# Patient Record
Sex: Female | Born: 1975 | Race: White | Hispanic: No | Marital: Married | Smoking: Current every day smoker
Health system: Southern US, Community
[De-identification: ages and names within clinical notes are randomized; demographics above are authoritative.]

## PROBLEM LIST (undated history)

## (undated) DIAGNOSIS — F1911 Other psychoactive substance abuse, in remission: Secondary | ICD-10-CM

## (undated) DIAGNOSIS — R030 Elevated blood-pressure reading, without diagnosis of hypertension: Secondary | ICD-10-CM

## (undated) DIAGNOSIS — F1910 Other psychoactive substance abuse, uncomplicated: Secondary | ICD-10-CM

## (undated) DIAGNOSIS — J45909 Unspecified asthma, uncomplicated: Secondary | ICD-10-CM

## (undated) DIAGNOSIS — F1010 Alcohol abuse, uncomplicated: Secondary | ICD-10-CM

## (undated) DIAGNOSIS — R51 Headache: Secondary | ICD-10-CM

## (undated) DIAGNOSIS — F1011 Alcohol abuse, in remission: Secondary | ICD-10-CM

## (undated) DIAGNOSIS — E785 Hyperlipidemia, unspecified: Secondary | ICD-10-CM

## (undated) DIAGNOSIS — K589 Irritable bowel syndrome without diarrhea: Secondary | ICD-10-CM

## (undated) DIAGNOSIS — N200 Calculus of kidney: Secondary | ICD-10-CM

## (undated) DIAGNOSIS — E669 Obesity, unspecified: Secondary | ICD-10-CM

## (undated) DIAGNOSIS — B009 Herpesviral infection, unspecified: Secondary | ICD-10-CM

## (undated) DIAGNOSIS — R7303 Prediabetes: Secondary | ICD-10-CM

## (undated) DIAGNOSIS — G43909 Migraine, unspecified, not intractable, without status migrainosus: Secondary | ICD-10-CM

## (undated) DIAGNOSIS — F419 Anxiety disorder, unspecified: Secondary | ICD-10-CM

## (undated) DIAGNOSIS — F32A Depression, unspecified: Secondary | ICD-10-CM

## (undated) HISTORY — DX: Herpesviral infection, unspecified: B00.9

## (undated) HISTORY — DX: Unspecified asthma, uncomplicated: J45.909

## (undated) HISTORY — DX: Other psychoactive substance abuse, uncomplicated: F19.10

## (undated) HISTORY — DX: Obesity, unspecified: E66.9

## (undated) HISTORY — DX: Alcohol abuse, in remission: F10.11

## (undated) HISTORY — DX: Prediabetes: R73.03

## (undated) HISTORY — DX: Elevated blood-pressure reading, without diagnosis of hypertension: R03.0

## (undated) HISTORY — DX: Headache: R51

## (undated) HISTORY — DX: Anxiety disorder, unspecified: F41.9

## (undated) HISTORY — PX: TYMPANOSTOMY TUBE PLACEMENT: SHX32

## (undated) HISTORY — DX: Alcohol abuse, uncomplicated: F10.10

## (undated) HISTORY — DX: Other psychoactive substance abuse, in remission: F19.11

## (undated) HISTORY — DX: Depression, unspecified: F32.A

## (undated) HISTORY — DX: Irritable bowel syndrome, unspecified: K58.9

## (undated) HISTORY — DX: Hyperlipidemia, unspecified: E78.5

## (undated) HISTORY — DX: Migraine, unspecified, not intractable, without status migrainosus: G43.909

## (undated) HISTORY — DX: Calculus of kidney: N20.0

---

## 1999-08-19 ENCOUNTER — Encounter: Payer: Self-pay | Admitting: Urology

## 1999-08-19 ENCOUNTER — Encounter: Admission: RE | Admit: 1999-08-19 | Discharge: 1999-08-19 | Payer: Self-pay | Admitting: Urology

## 2000-08-03 ENCOUNTER — Emergency Department (HOSPITAL_COMMUNITY): Admission: EM | Admit: 2000-08-03 | Discharge: 2000-08-03 | Payer: Self-pay | Admitting: Emergency Medicine

## 2000-08-05 ENCOUNTER — Emergency Department (HOSPITAL_COMMUNITY): Admission: EM | Admit: 2000-08-05 | Discharge: 2000-08-05 | Payer: Self-pay | Admitting: Emergency Medicine

## 2000-09-22 ENCOUNTER — Emergency Department (HOSPITAL_COMMUNITY): Admission: EM | Admit: 2000-09-22 | Discharge: 2000-09-22 | Payer: Self-pay | Admitting: Emergency Medicine

## 2000-12-20 ENCOUNTER — Emergency Department (HOSPITAL_COMMUNITY): Admission: EM | Admit: 2000-12-20 | Discharge: 2000-12-21 | Payer: Self-pay | Admitting: Emergency Medicine

## 2000-12-20 ENCOUNTER — Encounter: Payer: Self-pay | Admitting: Emergency Medicine

## 2004-03-23 HISTORY — PX: OTHER SURGICAL HISTORY: SHX169

## 2006-06-10 ENCOUNTER — Emergency Department (HOSPITAL_COMMUNITY): Admission: EM | Admit: 2006-06-10 | Discharge: 2006-06-10 | Payer: Self-pay | Admitting: Emergency Medicine

## 2007-01-15 ENCOUNTER — Encounter: Payer: Self-pay | Admitting: Family Medicine

## 2007-02-17 ENCOUNTER — Emergency Department (HOSPITAL_COMMUNITY): Admission: EM | Admit: 2007-02-17 | Discharge: 2007-02-18 | Payer: Self-pay | Admitting: Emergency Medicine

## 2007-03-24 ENCOUNTER — Emergency Department (HOSPITAL_COMMUNITY): Admission: EM | Admit: 2007-03-24 | Discharge: 2007-03-24 | Payer: Self-pay | Admitting: Emergency Medicine

## 2007-12-05 ENCOUNTER — Encounter: Payer: Self-pay | Admitting: Family Medicine

## 2007-12-05 ENCOUNTER — Encounter: Admission: RE | Admit: 2007-12-05 | Discharge: 2007-12-05 | Payer: Self-pay | Admitting: Internal Medicine

## 2007-12-16 ENCOUNTER — Encounter: Payer: Self-pay | Admitting: Family Medicine

## 2007-12-16 ENCOUNTER — Encounter: Admission: RE | Admit: 2007-12-16 | Discharge: 2007-12-16 | Payer: Self-pay | Admitting: Internal Medicine

## 2007-12-30 ENCOUNTER — Encounter: Admission: RE | Admit: 2007-12-30 | Discharge: 2007-12-30 | Payer: Self-pay | Admitting: Internal Medicine

## 2007-12-30 ENCOUNTER — Encounter: Payer: Self-pay | Admitting: Family Medicine

## 2008-10-30 ENCOUNTER — Ambulatory Visit: Payer: Self-pay | Admitting: Family Medicine

## 2008-10-30 DIAGNOSIS — A6000 Herpesviral infection of urogenital system, unspecified: Secondary | ICD-10-CM | POA: Insufficient documentation

## 2008-10-30 DIAGNOSIS — F1021 Alcohol dependence, in remission: Secondary | ICD-10-CM | POA: Insufficient documentation

## 2008-11-01 DIAGNOSIS — J309 Allergic rhinitis, unspecified: Secondary | ICD-10-CM | POA: Insufficient documentation

## 2008-11-01 DIAGNOSIS — Z87442 Personal history of urinary calculi: Secondary | ICD-10-CM

## 2008-11-09 ENCOUNTER — Encounter: Admission: RE | Admit: 2008-11-09 | Discharge: 2008-11-09 | Payer: Self-pay | Admitting: Family Medicine

## 2008-11-09 ENCOUNTER — Ambulatory Visit: Payer: Self-pay | Admitting: Family Medicine

## 2008-11-18 ENCOUNTER — Ambulatory Visit: Payer: Self-pay | Admitting: Family Medicine

## 2008-12-08 ENCOUNTER — Telehealth: Payer: Self-pay | Admitting: Family Medicine

## 2009-02-04 ENCOUNTER — Ambulatory Visit (HOSPITAL_COMMUNITY): Admission: RE | Admit: 2009-02-04 | Discharge: 2009-02-04 | Payer: Self-pay | Admitting: Urology

## 2009-02-10 ENCOUNTER — Telehealth: Payer: Self-pay | Admitting: Family Medicine

## 2009-03-09 ENCOUNTER — Ambulatory Visit: Payer: Self-pay | Admitting: Family Medicine

## 2009-03-09 LAB — CONVERTED CEMR LAB: Rapid Strep: NEGATIVE

## 2009-03-26 ENCOUNTER — Ambulatory Visit: Payer: Self-pay | Admitting: Family Medicine

## 2009-06-15 ENCOUNTER — Telehealth: Payer: Self-pay | Admitting: Family Medicine

## 2009-06-23 ENCOUNTER — Ambulatory Visit: Payer: Self-pay | Admitting: Family Medicine

## 2009-10-21 ENCOUNTER — Ambulatory Visit: Payer: Self-pay | Admitting: Family Medicine

## 2009-10-21 LAB — CONVERTED CEMR LAB: Rapid Strep: NEGATIVE

## 2009-11-08 ENCOUNTER — Ambulatory Visit: Payer: Self-pay | Admitting: Family Medicine

## 2009-11-08 DIAGNOSIS — E785 Hyperlipidemia, unspecified: Secondary | ICD-10-CM | POA: Insufficient documentation

## 2009-11-08 DIAGNOSIS — F988 Other specified behavioral and emotional disorders with onset usually occurring in childhood and adolescence: Secondary | ICD-10-CM | POA: Insufficient documentation

## 2009-11-08 DIAGNOSIS — R5383 Other fatigue: Secondary | ICD-10-CM

## 2009-11-08 DIAGNOSIS — R5381 Other malaise: Secondary | ICD-10-CM

## 2009-11-09 LAB — CONVERTED CEMR LAB: Vit D, 25-Hydroxy: 34 ng/mL

## 2009-11-10 ENCOUNTER — Telehealth: Payer: Self-pay | Admitting: Family Medicine

## 2009-11-12 LAB — CONVERTED CEMR LAB
Albumin: 3.9 g/dL (ref 3.5–5.2)
Alkaline Phosphatase: 43 units/L (ref 39–117)
Basophils Absolute: 0.1 10*3/uL (ref 0.0–0.1)
Basophils Relative: 0.4 % (ref 0.0–3.0)
CO2: 22 meq/L (ref 19–32)
Calcium: 9.4 mg/dL (ref 8.4–10.5)
Chloride: 107 meq/L (ref 96–112)
Eosinophils Absolute: 0 10*3/uL (ref 0.0–0.7)
Glucose, Bld: 86 mg/dL (ref 70–99)
HCT: 43.6 % (ref 36.0–46.0)
HDL: 48.7 mg/dL (ref >39.00)
Hemoglobin: 14.3 g/dL (ref 12.0–15.0)
Lymphs Abs: 3.2 10*3/uL (ref 0.7–4.0)
MCHC: 32.8 g/dL (ref 30.0–36.0)
MCV: 94.2 fL (ref 78.0–100.0)
Monocytes Absolute: 1 10*3/uL (ref 0.1–1.0)
Neutro Abs: 12.3 10*3/uL — ABNORMAL HIGH (ref 1.4–7.7)
RBC: 4.63 M/uL (ref 3.87–5.11)
RDW: 12.4 % (ref 11.5–14.6)
Sodium: 142 meq/L (ref 135–145)
TSH: 1.08 microintl units/mL (ref 0.35–5.50)
Total CHOL/HDL Ratio: 4
Total Protein: 7.2 g/dL (ref 6.0–8.3)

## 2009-11-16 ENCOUNTER — Encounter: Payer: Self-pay | Admitting: Family Medicine

## 2009-12-13 ENCOUNTER — Ambulatory Visit: Payer: Self-pay | Admitting: Family Medicine

## 2009-12-13 DIAGNOSIS — K589 Irritable bowel syndrome without diarrhea: Secondary | ICD-10-CM

## 2010-01-14 ENCOUNTER — Telehealth: Payer: Self-pay | Admitting: Family Medicine

## 2010-01-17 ENCOUNTER — Encounter: Payer: Self-pay | Admitting: Family Medicine

## 2010-01-31 ENCOUNTER — Ambulatory Visit: Payer: Self-pay | Admitting: Family Medicine

## 2010-01-31 LAB — CONVERTED CEMR LAB
Bilirubin Urine: NEGATIVE
Glucose, Urine, Semiquant: NEGATIVE
Ketones, urine, test strip: NEGATIVE
Specific Gravity, Urine: 1.02
Urobilinogen, UA: 0.2

## 2010-03-10 ENCOUNTER — Ambulatory Visit: Payer: Self-pay | Admitting: Family Medicine

## 2010-03-10 LAB — CONVERTED CEMR LAB
Glucose, Urine, Semiquant: NEGATIVE
Protein, U semiquant: NEGATIVE
pH: 7

## 2010-04-08 ENCOUNTER — Telehealth: Payer: Self-pay | Admitting: Family Medicine

## 2010-04-10 ENCOUNTER — Encounter: Payer: Self-pay | Admitting: Family Medicine

## 2010-04-19 ENCOUNTER — Telehealth: Payer: Self-pay | Admitting: Family Medicine

## 2010-04-22 ENCOUNTER — Ambulatory Visit: Payer: Self-pay | Admitting: Family Medicine

## 2010-06-23 ENCOUNTER — Ambulatory Visit: Payer: Self-pay | Admitting: Family Medicine

## 2010-06-23 ENCOUNTER — Telehealth: Payer: Self-pay | Admitting: Family Medicine

## 2010-06-23 DIAGNOSIS — J45909 Unspecified asthma, uncomplicated: Secondary | ICD-10-CM | POA: Insufficient documentation

## 2010-06-23 DIAGNOSIS — N6459 Other signs and symptoms in breast: Secondary | ICD-10-CM

## 2010-06-23 LAB — CONVERTED CEMR LAB
Nitrite: NEGATIVE
Protein, U semiquant: NEGATIVE
Specific Gravity, Urine: >=1.03
WBC Urine, dipstick: NEGATIVE

## 2010-06-24 LAB — CONVERTED CEMR LAB: Prolactin: 10 ng/mL

## 2010-06-29 ENCOUNTER — Encounter: Admission: RE | Admit: 2010-06-29 | Discharge: 2010-06-29 | Payer: Self-pay | Admitting: Family Medicine

## 2010-06-30 ENCOUNTER — Telehealth: Payer: Self-pay | Admitting: Family Medicine

## 2010-06-30 ENCOUNTER — Encounter: Payer: Self-pay | Admitting: Family Medicine

## 2010-07-05 ENCOUNTER — Ambulatory Visit: Payer: Self-pay | Admitting: Internal Medicine

## 2010-07-05 LAB — CONVERTED CEMR LAB
Bilirubin Urine: NEGATIVE
Glucose, Urine, Semiquant: NEGATIVE
Specific Gravity, Urine: 1.025
pH: 5

## 2010-07-06 ENCOUNTER — Encounter: Payer: Self-pay | Admitting: Family Medicine

## 2010-07-07 ENCOUNTER — Telehealth: Payer: Self-pay | Admitting: Family Medicine

## 2010-09-24 ENCOUNTER — Ambulatory Visit: Payer: Self-pay | Admitting: Internal Medicine

## 2010-09-30 ENCOUNTER — Encounter: Payer: Self-pay | Admitting: Family Medicine

## 2010-09-30 ENCOUNTER — Ambulatory Visit: Payer: Self-pay | Admitting: Internal Medicine

## 2010-10-31 ENCOUNTER — Telehealth (INDEPENDENT_AMBULATORY_CARE_PROVIDER_SITE_OTHER): Payer: Self-pay | Admitting: *Deleted

## 2010-11-02 ENCOUNTER — Ambulatory Visit
Admission: RE | Admit: 2010-11-02 | Discharge: 2010-11-02 | Payer: Self-pay | Source: Home / Self Care | Attending: Family Medicine | Admitting: Family Medicine

## 2010-11-02 DIAGNOSIS — M5430 Sciatica, unspecified side: Secondary | ICD-10-CM | POA: Insufficient documentation

## 2010-11-03 ENCOUNTER — Encounter: Payer: Self-pay | Admitting: Family Medicine

## 2010-11-03 ENCOUNTER — Telehealth: Payer: Self-pay | Admitting: Family Medicine

## 2010-11-11 ENCOUNTER — Telehealth (INDEPENDENT_AMBULATORY_CARE_PROVIDER_SITE_OTHER): Payer: Self-pay | Admitting: *Deleted

## 2010-11-13 ENCOUNTER — Encounter: Payer: Self-pay | Admitting: Internal Medicine

## 2010-11-22 NOTE — Progress Notes (Signed)
Summary: Hand swollen  Phone Note Call from Patient Call back at Home Phone [phone]   Caller: Patient Call For: [name] MD Summary of Call: Patient was in earlier today to see you and had lab work done. Patient called to let you know that the area were the blood was drawn from her hand is swollen about the size of a quarter, hot and painful. Patient states that she has never had this happen before after having blood work. Please advise. Initial call taken by: Sydell Axon LPN,  June 23, 2010 1:48 PM  Follow-up for Phone Call        could have some thrombophelbitis -- apply ice several times a day, tylenol could also have blown a vein -- will know if she gets diffuse bruising later today or tomorrow  if dramatically worse, can always be rechecked tomorrow Follow-up by: [name] MD,  June 23, 2010 2:00 PM  Additional Follow-up for Phone Call Additional follow up Details #1::        patient advised Additional Follow-up by: Benny Lennert CMA (AAMA),  June 23, 2010 2:02 PM

## 2010-11-22 NOTE — Letter (Signed)
Summary: Out of School  [facility]  [address] [place], [place] [address]   Phone: [phone]  Fax: [phone]    September 30, 2010   Student:  Candice Mcgrath    To Whom It May Concern:   For Medical reasons, please excuse the above named student from school for the following dates:  Start:   September 30, 2010  End:    September 30, 2010   Also, patient was seen for this last saturday 09/24/2010.   If you need additional information, please feel free to contact our office.   Sincerely,    Eustaquio Boyden  MD    ****This is a legal document and cannot be tampered with.  Schools are authorized to verify all information and to do so accordingly.

## 2010-11-22 NOTE — Assessment & Plan Note (Signed)
Summary: HIP PAIN   Vital Signs:  Patient profile:   35 year old female Weight:      162 pounds Temp:     97.2 degrees F oral Pulse rate:   76 / minute Pulse rhythm:   regular BP sitting:   120 / 72  (left arm) Cuff size:   regular  Vitals Entered By: [name] Sprinkles, CMA (September 24, 2010 8:56 AM) CC: bilat hip pain off and on/SD   History of Present Illness: CC: B hip pain  1wk h/o "sciatic pain".  Started on R side, buttock, then moved to L side now.  Hurts to sit on hard seats which she does at school.  Heat helps.  Toradol helps.  No radiation of pain.  seems to be getting better.  Has had pain like this for several years, lasts about a week then comes back.  No fevers/chills, numbness in legs, shooting pain down legs, back pain.  Current Medications (verified): 1)  Valacyclovir Hcl 500 Mg Tabs (Valacyclovir Hcl) .Marland Kitchen.. 1 By Mouth As Needed 2)  Amitiza 8 Mcg Caps (Lubiprostone) .... Take One Tablet Two Times A Day 3)  Allegra 180 Mg Tabs (Fexofenadine Hcl) .... Take One Tablet Daily 4)  Qvar 80 Mcg/act Aers (Beclomethasone Dipropionate) .... Two Puffs Two Times A Day 5)  Proair Hfa 108 (90 Base) Mcg/act Aers (Albuterol Sulfate) .... I Puff As Needed. Use Sparingly 6)  Strattera 40 Mg Caps (Atomoxetine Hcl) .Marland Kitchen.. 1 By Mouth Two Times A Day (Contraindication To Stimulants) 7)  Diclofenac Sodium 75 Mg Tbec (Diclofenac Sodium) .... One By Mouth Two Times A Day As Needed Flank Pain 8)  Flomax 0.4 Mg Caps (Tamsulosin Hcl) .... One By Mouth Daily For Kidney Stone  Allergies (verified): 1)  ! Bactrim 2)  ! Sulfa  Past History:  Past Medical History: Last updated: 07/05/2010 Alcoholic - sober 4696, [facility] [address] 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 Renaldo Fiddler URO = Dr. Brunilda Payor  Social History: Last updated: 10/30/2008 Occupation: [employer], [employer] Recovering AA, NA Current Smoker  Review of Systems       per HPI  Physical Exam  General:  Well-developed,well-nourished,in no acute distress; alert,appropriate and cooperative throughout examination Msk:  No deformity or scoliosis noted of thoracic or lumbar spine.  no midline spinal tenderness.  + mild tenderness with palpation of L SI joint.  No pain B GTB.  neg SLR test, no pain with int/ext rotation at hip, FABER test tenderness anterior hips.  + tender to palp L buttock at piriformis. Neurologic:  sensation intact   Impression & Recommendations:  Problem # 1:  MUSCLE STRAIN, LEFT BUTTOCK (ICD-848.8) could be very mild resolving sciatica.  treat with massage, ice/heat.  Pt hesitance to use pharmacotherapy given hx.  to call if not better for NSAID/flexeril script, to return if worsening.  Complete Medication List: 1)  Valacyclovir Hcl 500 Mg Tabs (Valacyclovir hcl) .Marland Kitchen.. 1 by mouth as needed 2)  Amitiza 8 Mcg Caps (Lubiprostone) .... Take one tablet two times a day 3)  Allegra 180 Mg Tabs (Fexofenadine hcl) .... Take one tablet daily 4)  Qvar 80 Mcg/act Aers (Beclomethasone dipropionate) .... Two puffs two times a day 5)  Proair Hfa 108 (90 Base) Mcg/act Aers (Albuterol sulfate) .... I puff as needed. use sparingly 6)  Strattera 40 Mg Caps (Atomoxetine hcl) .Marland Kitchen.. 1 by mouth two  times a day (contraindication to stimulants) 7)  Diclofenac Sodium 75 Mg Tbec (Diclofenac sodium) .... One by mouth two times a day as needed flank pain 8)  Flomax 0.4 Mg Caps (Tamsulosin hcl) .... One by mouth daily for kidney stone  Patient Instructions: 1)  Sounds like some irritation of sciatic nerve. 2)  Try tennis ball or frozen water bottle to bottom (massage). 3)  Continue stretching exercises. 4)  Call us if not better and we could prescribe anti inflammatories or muscle relaxants.  5)  If getting worse, please return to be seen.    Orders Added: 1)  Est. Patient Level III  [[address]]

## 2010-11-22 NOTE — Progress Notes (Signed)
Summary: still with abd cramps  Phone Note Call from Patient Call back at [facility] Phone [phone]   Summary of Call: Pt was seen last week and given macrobid for UTI.  She finished this today and she still has lower abd cramps.  No other sxs.  Asks if she should continue medicine for a few more days.  [facility]. Initial call taken by: Lowella Petties CMA,  June 30, 2010 4:42 PM  Follow-up for Phone Call        i did not get a culture -- can she drop off a urine sample tomorrow sometime, afterschool or something like that is fine. I would like to change antibiotics, but i want to send a sample off for culture and sensitivity.  Urine culture ordered Karleen Hampshire Copland MD  June 30, 2010 4:53 PM   change to  cipro 250 mg, 1 by mouth two times a day, #14 call in when she brings in sample tomorrow if not improving early next week, f/u Follow-up by: [name] MD,  June 30, 2010 4:52 PM  Additional Follow-up for Phone Call Additional follow up Details #1::        Left message on machine for patient to call back. Sydell Axon LPN  June 30, 2010 5:15 PM  [facility] for pt to call.          Lowella Petties CMA  July 01, 2010 11:33 AM Left message on machine for pt to call back and let us know how she is doing.          Lowella Petties CMA  July 04, 2010 9:53 AM     Additional Follow-up for Phone Call Additional follow up Details #2::    Pt now states she has low back pain, thinks she has a kidney stoney.  Per Dr. Patsy Lager appt made for her to be seen on 9/13. Follow-up by: Lowella Petties CMA,  July 04, 2010 5:07 PM

## 2010-11-22 NOTE — Assessment & Plan Note (Signed)
Summary: SCIATIC NERVE,DR'S NOTE FOR WORK/CLE   Vital Signs:  Patient profile:   35 year old female Height:      61 inches Weight:      162.75 pounds BMI:     30.86 Temp:     98.1 degrees F oral Pulse rate:   80 / minute Pulse rhythm:   regular BP sitting:   110 / 70  (right arm) Cuff size:   regular  Vitals Entered By: Linde Gillis CMA Duncan Dull) (September 30, 2010 9:00 AM) CC: sciatic nerve, doctors note   History of Present Illness: CC: B hip pain  2wk h/o sciatic pain.  eval at [facility] clinic on Saturday for same complaint.  got worse.  L side worse, now right side also acting up.  Pain localized to bottom, hurts at "hip socket".  trouble walking in morning.  Burgess Estelle was birthday, unable to go out dancing.  Wednesday night almost went to ER 2/2 pain.  Only took 1 toradol/day for pain, which helps some but tries to save those for kidney stones.  Also taking advil.    Hurts to sit on hard seats which she does at school.  Heat helps.    Has had pain like this for several years, lasts about a week then goes away.  No fevers/chills, numbness in legs, shooting pain down legs, back pain.  Allergies: 1)  ! Bactrim 2)  ! Sulfa  Past History:  Past Medical History: Last updated: 07/05/2010 Alcoholic - sober 9604, [facility] [address] 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 Renaldo Fiddler URO = Dr. Brunilda Payor  Social History: Last updated: 10/30/2008 Occupation: [employer], [employer] Recovering AA, NA Current Smoker  Review of Systems       per HPI  Physical Exam  General:  Well-developed,well-nourished,in no acute distress; alert,appropriate and cooperative throughout examination Msk:  No deformity or scoliosis noted of thoracic or lumbar spine.  no midline spinal tenderness.  + mild tenderness with palpation of L SI joint.  No pain B GTB.  neg SLR test, no pain  with int/ext rotation at hip, FABER test tenderness anterior hips.  Main tenderness is at piriformis L>R.   Pulses:  2+ periph pulses Neurologic:  sensation intact, DTRs bilaterally intact LE Skin:  Intact without suspicious lesions or rashes   Impression & Recommendations:  Problem # 1:  SCIATICA, BILATERAL (ICD-724.3) again sounds like possible sciatica, now bilaterally.  treat with steroids, discused precautions.  provided with work excuse note.  If not better with steroids, rec f/u with Copland for further evaluation.  Her updated medication list for this problem includes:    Diclofenac Sodium 75 Mg Tbec (Diclofenac sodium) ..... One by mouth two times a day as needed flank pain  Complete Medication List: 1)  Valacyclovir Hcl 500 Mg Tabs (Valacyclovir hcl) .Marland Kitchen.. 1 by mouth as needed 2)  Amitiza 8 Mcg Caps (Lubiprostone) .... Take one tablet two times a day 3)  Allegra 180 Mg Tabs (Fexofenadine hcl) .... Take one tablet daily 4)  Qvar 80 Mcg/act Aers (Beclomethasone dipropionate) .... Two puffs two times a day 5)  Proair Hfa 108 (90 Base) Mcg/act Aers (Albuterol sulfate) .... I puff as needed. use sparingly 6)  Strattera 40 Mg Caps (Atomoxetine hcl) .Marland Kitchen.. 1 by mouth two times a day (contraindication to stimulants) 7)  Diclofenac Sodium 75 Mg Tbec (Diclofenac sodium) .... One by mouth two  times a day as needed flank pain 8)  Prednisone 50 Mg Tabs (Prednisone) .... Take one daily for 7 days  Patient Instructions: 1)  Sciatica pain continued. 2)  steroids for nerve irritation. 3)  stretching exercises handout provided. 4)  If not better, please return to see Dr. Patsy Lager. Prescriptions: PREDNISONE 50 MG TABS (PREDNISONE) take one daily for 7 days  #7 x 0   Entered and Authorized by:   Eustaquio Boyden  MD   Signed by:   Eustaquio Boyden  MD on 09/30/2010   Method used:   [facility]        [facility] [address] [phone]* (retail)       [address]       [place], [place]  [address]       Ph: [phone] or [phone]       Fax: [phone]   RxID:   [id number]    Orders Added: 1)  Est. Patient Level III [[address]]    Current Allergies (reviewed today): ! BACTRIM ! SULFA

## 2010-11-22 NOTE — Assessment & Plan Note (Signed)
Summary: ? KIDNEY STONE   Vital Signs:  Patient profile:   35 year old female Weight:      156.50 pounds Temp:     97.3 degrees F oral Pulse rate:   74 / minute Pulse rhythm:   regular BP sitting:   116 / 82  (left arm) Cuff size:   regular  Vitals Entered By: Selena Batten Dance CMA Duncan Dull) (July 05, 2010 3:52 PM) CC: ? Kidney stone   History of Present Illness: CC: L kidney pain  h/o kidney stones since age 46 (calcium oxalate).  usually has kidney stones at beginning of school (increased stress).  Seen at beginning of september, treated with macrobid for possible UTI.  Not fully improved.  Plan was to culture urine prior to starting Cipro.  However now having lower back pain on L side x 2days so asked to return for re evaluation.  Pain similar to kidney stone pain in past.  Pain characerized as dull, not yet sharp colicky as had before.  No radiation into groin.  Drinking plenty of water.  No fevers/chills, nausea, vomiting, dysuria, urgency.  + mild frequency.  Pt in recovery so only does PO toradol as prescribed.  usually goes to Dr. Brunilda Payor twice a year for kidney stones.  Was on flomax but unsure if ever helped.  Allergies: 1)  ! Bactrim 2)  ! 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 Renaldo Fiddler URO = Dr. Brunilda Payor  Past Surgical History: Reviewed history from 10/30/2008 and no changes required. Basket surgery, kidney stones, 2005,6  Review of Systems       per HPI  Physical Exam  General:  Well-developed,well-nourished,in no acute distress; alert,appropriate and cooperative throughout examination Lungs:  Normal respiratory effort, chest expands symmetrically. Lungs are clear to auscultation, no crackles or wheezes. Heart:  Normal rate and regular rhythm. S1 and S2 normal without gallop,  murmur, click, rub or other extra sounds. Abdomen:  Bowel sounds positive,abdomen soft and non-tender without masses, organomegaly or hernias noted.  + mild CVA tenderness on Left   Pulses:  2+ periph pulses Extremities:  No clubbing, cyanosis, edema, or deformity noted with normal full range of motion of all joints.   Skin:  Intact without suspicious lesions or rashes   Impression & Recommendations:  Problem # 1:  FLANK PAIN, LEFT (ICD-789.09) story and UA consistent with rpt kidney stone.  Pt knows what kidney stones feel like, advised to f/u if not improving as expected or if red flags.  Treat with ketorolac as well as flomax and straining urine, encouraged to continue increasing fluids.  did send in culture (s/p treatment with macrobid).  reviewed allergy to bactrim, itching, doubt flomax will cross react.  advised to monitor.  Orders: T-Culture, Urine ([id number]) Specimen Handling ([address]) UA Dipstick W/ Micro (manual) ([address])  Her updated medication list for this problem includes:    Diclofenac Sodium 75 Mg Tbec (Diclofenac sodium) ..... One by mouth two times a day as needed flank pain  Complete Medication List: 1)  Valacyclovir Hcl 500 Mg Tabs (Valacyclovir hcl) .Marland Kitchen.. 1 by mouth as needed 2)  Amitiza 8 Mcg Caps (Lubiprostone) .... Take one tablet two times a day 3)  Allegra 180 Mg Tabs (Fexofenadine hcl) .... Take one tablet daily 4)  Qvar 80 Mcg/act Aers (Beclomethasone dipropionate) .... Two puffs  two times a day 5)  Proair Hfa 108 (90 Base) Mcg/act Aers (Albuterol sulfate) .... I puff as needed. use sparingly 6)  Strattera 40 Mg Caps (Atomoxetine hcl) .Marland Kitchen.. 1 by mouth two times a day (contraindication to stimulants) 7)  Diclofenac Sodium 75 Mg Tbec (Diclofenac sodium) .... One by mouth two times a day as needed flank pain 8)  Flomax 0.4 Mg Caps (Tamsulosin hcl) .... One by mouth daily for kidney stone  Patient Instructions: 1)  Looks like a kidney stone.  Urine culture sent  today.  if positive, we will call you with another course of antibiotics. 2)  Treat for now with ketorolac by mouth as well as flomax.  Please return if not improving with these measures. 3)  Strain urine.  increase water intake. Prescriptions: FLOMAX 0.4 MG CAPS (TAMSULOSIN HCL) one by mouth daily for kidney stone  #10 x 0   Entered and Authorized by:   Eustaquio Boyden  MD   Signed by:   Eustaquio Boyden  MD on 07/05/2010   Method used:   [facility]ron[facility] to        [facility] [address] [phone]* (retail)       [address]       [place], [place]  [address]       Ph: [phone] or [phone]       Fax: [phone]   RxID:   [id number] DICLOFENAC SODIUM 75 MG TBEC (DICLOFENAC SODIUM) one by mouth two times a day as needed flank pain  #30 x 0   Entered and Authorized by:   Eustaquio Boyden  MD   Signed by:   Eustaquio Boyden  MD on 07/05/2010   Method used:   [facility]ronically to        [facility] [address] [phone]* (retail)       [address]       [place], [place]  [address]       Ph: [phone] or [phone]       Fax: [phone]   RxID:   [id number]   Current Allergies (reviewed today): ! BACTRIM ! SULFA  Laboratory Results   Urine Tests  Date/Time Received: July 05, 2010 3:55 PM  Date/Time Reported: July 05, 2010 3:55 PM  Routine Urinalysis   Color: yellow Appearance: Clear Glucose: negative   (Normal Range: Negative) Bilirubin: negative   (Normal Range: Negative) Ketone: negative   (Normal Range: Negative) Spec. Gravity: 1.025   (Normal Range: 1.003-1.035) Blood: moderate   (Normal Range: Negative) pH: 5.0   (Normal Range: 5.0-8.0) Protein: trace   (Normal Range: Negative) Urobilinogen: 0.2   (Normal Range: 0-1) Nitrite: negative   (Normal Range: Negative) Leukocyte Esterace: trace   (Normal Range: Negative)  Urine Microscopic WBC/HPF: 1-5 RBC/HPF: 1-5 Bacteria/HPF: few Mucous/HPF:  no Epithelial/HPF: rare Crystals/HPF: yes - ?CaOx Casts/LPF: no Yeast/HPF: no    Comments: read by ............................Eustaquio Boyden  MD  July 05, 2010 4:19 PM  UCx sent.

## 2010-11-22 NOTE — Assessment & Plan Note (Signed)
Summary: 1 MONTH FOLLOW UP/RBH   Vital Signs:  Patient profile:   35 year old female Height:      61 inches Weight:      173.2 pounds BMI:     32.84 Temp:     97.6 degrees F oral Pulse rate:   76 / minute Pulse rhythm:   regular BP sitting:   120 / 76  (left arm) Cuff size:   regular  Vitals Entered By: Benny Lennert CMA Duncan Dull) (December 13, 2009 8:23 AM)  History of Present Illness: Chief complaint 1 month follow up  35 year old female:   f/u ADD: Lawrence Santiago, thinks it is helping, some constipation. 1st week had some se  2. herpes, stable, needs valtrex refills  3. IBS: intermittent const, worsened since on straterra, out of amitiza  4. Allergic rhinitis: relatively stable, needs allegra refills    Current Problems (verified): 1)  Ibs  (ICD-564.1) 2)  Add  (ICD-314.00) 3)  Obesity  (ICD-278.00) 4)  Fatigue  (ICD-780.79) 5)  Hyperlipidemia  (ICD-272.4) 6)  Genital Herpes  (ICD-054.10) 7)  Personal History of Alcoholism  (ICD-V11.3) 8)  Nephrolithiasis, Hx of  (ICD-V13.01) 9)  Allergic Rhinitis  (ICD-477.9)  Allergies: 1)  ! Bactrim 2)  ! Sulfa  Past History:  Past medical, surgical, family and social histories (including risk factors) reviewed, and no changes noted (except as noted below).  Past Medical History: Alcoholic - sober 1610, [facility] [address] 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 Renaldo Fiddler  Past Surgical History: Reviewed history from 10/30/2008 and no changes required. Basket surgery, kidney stones, 2005,6  Family History: Reviewed history from 10/30/2008 and no changes required. GP, ETOH Breast CA, Cousin CVA, GGM  Social History: Reviewed history from 10/30/2008 and no changes required. Occupation: [place] Recovering AA, NA Current Smoker  Review of Systems       ROS: GEN: No acute  illnesses, no fevers, chills, sweats, fatigue, weight loss, or URI sx. GI: No n/v/d Pulm: No SOB, cough, wheezing Interactive and getting along well at home.  Otherwise, ROS is as per the HPI.   Physical Exam  Additional Exam:  GEN: WDWN, NAD, Non-toxic, A & O x 3 HEENT: Atraumatic, Normocephalic. Neck supple. No masses, No LAD. Ears and Nose: No external deformity. CV: RRR, No M/G/R. No JVD. No thrill. No extra heart sounds. PULM: CTA B, no wheezes, crackles, rhonchi. No retractions. No resp. distress. No accessory muscle use. EXTR: No c/c/e NEURO: Normal gait.  PSYCH: Normally interactive. Conversant. Not depressed or anxious appearing.  Calm demeanor.     Impression & Recommendations:  Problem # 1:  ADD (ICD-314.00) Assessment Improved tol meds, refills  Problem # 2:  GENITAL HERPES (ICD-054.10) Assessment: Unchanged refill valtrex  Problem # 3:  IBS (ICD-564.1) Assessment: Deteriorated refill amitiza  Problem # 4:  ALLERGIC RHINITIS (ICD-477.9) Assessment: Unchanged  Her updated medication list for this problem includes:    Allegra 180 Mg Tabs (Fexofenadine hcl) .Marland Kitchen... Take one tablet daily  Complete Medication List: 1)  Valacyclovir Hcl 500 Mg Tabs (Valacyclovir hcl) .Marland Kitchen.. 1 by mouth as needed 2)  Amitiza 8 Mcg Caps (Lubiprostone) .... Take one tablet two times a day 3)  Allegra 180 Mg Tabs (Fexofenadine hcl) .... Take one tablet daily 4)  Qvar 80 Mcg/act Aers (Beclomethasone dipropionate) .... Two puffs once a day 5)  Proair Hfa 108 (  90 Base) Mcg/act Aers (Albuterol sulfate) .... I puff as needed. use sparingly 6)  Strattera 40 Mg Caps (Atomoxetine hcl) .Marland Kitchen.. 1 by mouth two times a day (contraindication to stimulants) Prescriptions: STRATTERA 40 MG CAPS (ATOMOXETINE HCL) 1 by mouth two times a day (Contraindication to stimulants)  #60 x 11   Entered and Authorized by:   [name] MD   Signed by:   [name] MD on 12/13/2009   Method used:    [facility] to        [facility] [address] [phone]* (retail)       [address]       [place], [place]  [address]       Ph: [phone] or [phone]       Fax: [phone]   RxID:   [id number] VALACYCLOVIR HCL 500 MG TABS (VALACYCLOVIR HCL) 1 by mouth as needed  #30 x 11   Entered and Authorized by:   [name] MD   Signed by:   [name] MD on 12/13/2009   Method used:   Electronically to        [facility] [address] [phone]* (retail)       [address]       [place], [place]  [address]       Ph: [phone] or [phone]       Fax: [phone]   RxID:   [id number] ALLEGRA 180 MG TABS (FEXOFENADINE HCL) take one tablet daily  #30 x 11   Entered and Authorized by:   [name] MD   Signed by:   [name] MD on 12/13/2009   Method used:   Electronically to        [facility] [address] [phone]* (retail)       [address]       [place], [place]  [address]       Ph: [phone] or [phone]       Fax: [phone]   RxID:   [id number] AMITIZA 8 MCG CAPS (LUBIPROSTONE) take one tablet two times a day  #60 x 11   Entered and Authorized by:   [name] MD   Signed by:   [name] MD on 12/13/2009   Method used:   Electronically to        [facility] [address] [phone]* (retail)       [address]       [place], [place]  [address]       Ph: [phone] or [phone]       Fax: [phone]   RxID:   [id number]   Current Allergies (reviewed today): ! BACTRIM ! SULFA

## 2010-11-22 NOTE — Assessment & Plan Note (Signed)
Summary: ? UTI/ 10:15/ lb   Vital Signs:  Patient profile:   35 year old female Height:      61 inches Weight:      164.25 pounds BMI:     31.15 Temp:     97.7 degrees F oral Pulse rate:   92 / minute Pulse rhythm:   regular BP sitting:   102 / 76  (left arm) Cuff size:   regular  Vitals Entered By: Delilah Shan CMA Duncan Dull) (January 31, 2010 10:10 AM) CC: ? UTI   History of Present Illness: 35 yo with 3 days if increased urinary frequency, suprapubic pressure. No dysuria, back pain, hematuria, n/v/ or fevers. Used to get UTIs very frequently, has not had one in several months.  PMH- allergic to Bactrim(sulfa)  Current Medications (verified): 1)  Valacyclovir Hcl 500 Mg Tabs (Valacyclovir Hcl) .Marland Kitchen.. 1 By Mouth As Needed 2)  Amitiza 8 Mcg Caps (Lubiprostone) .... Take One Tablet Two Times A Day 3)  Allegra 180 Mg Tabs (Fexofenadine Hcl) .... Take One Tablet Daily 4)  Qvar 80 Mcg/act Aers (Beclomethasone Dipropionate) .... Two Puffs Once A Day 5)  Proair Hfa 108 (90 Base) Mcg/act Aers (Albuterol Sulfate) .... I Puff As Needed. Use Sparingly 6)  Strattera 40 Mg Caps (Atomoxetine Hcl) .Marland Kitchen.. 1 By Mouth Two Times A Day (Contraindication To Stimulants) 7)  Cipro 500 Mg Tabs (Ciprofloxacin Hcl) .Marland Kitchen.. 1 By Mouth 2 Times Daily X 7 Days  Allergies (verified): 1)  ! Bactrim 2)  ! Sulfa  Review of Systems      See HPI GI:  Denies nausea and vomiting. GU:  Complains of urinary frequency; denies dysuria and hematuria.  Physical Exam  General:  Well-developed,well-nourished,in no acute distress; alert,appropriate and cooperative throughout examination Abdomen:  Bowel sounds positive,abdomen soft and non-tender without masses, organomegaly or hernias noted. NO CVA tenderness. Mild suprapubic tenderness Psych:  Cognition and judgment appear intact. Alert and cooperative with normal attention span and concentration. No apparent delusions, illusions, hallucinations   Impression &  Recommendations:  Problem # 1:  UTI (ICD-599.0) Assessment New UA consistent with UTI. Will send for culture given her h/o recurrent UTIs in past. Cipro 500 mg two times a day x 7 days. Her updated medication list for this problem includes:    Cipro 500 Mg Tabs (Ciprofloxacin hcl) .Marland Kitchen... 1 by mouth 2 times daily x 7 days  Complete Medication List: 1)  Valacyclovir Hcl 500 Mg Tabs (Valacyclovir hcl) .Marland Kitchen.. 1 by mouth as needed 2)  Amitiza 8 Mcg Caps (Lubiprostone) .... Take one tablet two times a day 3)  Allegra 180 Mg Tabs (Fexofenadine hcl) .... Take one tablet daily 4)  Qvar 80 Mcg/act Aers (Beclomethasone dipropionate) .... Two puffs once a day 5)  Proair Hfa 108 (90 Base) Mcg/act Aers (Albuterol sulfate) .... I puff as needed. use sparingly 6)  Strattera 40 Mg Caps (Atomoxetine hcl) .Marland Kitchen.. 1 by mouth two times a day (contraindication to stimulants) 7)  Cipro 500 Mg Tabs (Ciprofloxacin hcl) .Marland Kitchen.. 1 by mouth 2 times daily x 7 days  Other Orders: T-Culture, Urine ([id number]) Prescriptions: CIPRO 500 MG TABS (CIPROFLOXACIN HCL) 1 by mouth 2 times daily x 7 days  #14 x 0   Entered and Authorized by:   Ruthe Mannan MD   Signed by:   Ruthe Mannan MD on 01/31/2010   Method used:   [facility]        [facility] [address] [phone]* (retail)  [address]       [place], [place]  [address]       Ph: [phone] or [phone]       Fax: [phone]   RxID:   [id number]   Laboratory Results   Urine Tests   Date/Time Reported: January 31, 2010 10:21 AM   Routine Urinalysis   Color: yellow Appearance: Clear Glucose: negative   (Normal Range: Negative) Bilirubin: negative   (Normal Range: Negative) Ketone: negative   (Normal Range: Negative) Spec. Gravity: 1.020   (Normal Range: 1.003-1.035) Blood: small   (Normal Range: Negative) pH: 6.0   (Normal Range: 5.0-8.0) Protein: trace   (Normal Range: Negative) Urobilinogen: 0.2   (Normal Range:  0-1) Nitrite: negative   (Normal Range: Negative) Leukocyte Esterace: trace   (Normal Range: Negative)

## 2010-11-22 NOTE — Assessment & Plan Note (Signed)
Summary: THROWING UP LAST NIGHT/CLE   Vital Signs:  Patient profile:   35 year old female Height:      61 inches Weight:      155.6 pounds BMI:     29.51 Temp:     97.7 degrees F oral Pulse rate:   80 / minute Pulse rhythm:   regular BP sitting:   100 / 70  (left arm) Cuff size:   regular  Vitals Entered By: Benny Lennert CMA Duncan Dull) (April 22, 2010 12:23 PM)  History of Present Illness: Chief complaint throwing up and Nausea  In last 24 hours.. vomiting..several friends who ate same food are throwing up as well. Not keeping down liquids until this AM.  Now has had significant amount of fluids, no food. Now diarrhea x 24 hours. Pain in upper abdomen. No fever...fatigue, sweating.   Has also recently had IBS attack earlier this week...causing constipation.  No blood in stool.  Seen in 02/2010..felt viral GE causing diarrhea    Problems Prior to Update: 1)  Uti  (ICD-599.0) 2)  Dysuria  (ICD-788.1) 3)  Ibs  (ICD-564.1) 4)  Add  (ICD-314.00) 5)  Obesity  (ICD-278.00) 6)  Fatigue  (ICD-780.79) 7)  Hyperlipidemia  (ICD-272.4) 8)  Genital Herpes  (ICD-054.10) 9)  Personal History of Alcoholism  (ICD-V11.3) 10)  Nephrolithiasis, Hx of  (ICD-V13.01) 11)  Allergic Rhinitis  (ICD-477.9)  Current Medications (verified): 1)  Valacyclovir Hcl 500 Mg Tabs (Valacyclovir Hcl) .Marland Kitchen.. 1 By Mouth As Needed 2)  Amitiza 8 Mcg Caps (Lubiprostone) .... Take One Tablet Two Times A Day 3)  Allegra 180 Mg Tabs (Fexofenadine Hcl) .... Take One Tablet Daily 4)  Qvar 80 Mcg/act Aers (Beclomethasone Dipropionate) .... Two Puffs Once A Day 5)  Proair Hfa 108 (90 Base) Mcg/act Aers (Albuterol Sulfate) .... I Puff As Needed. Use Sparingly 6)  Strattera 40 Mg Caps (Atomoxetine Hcl) .Marland Kitchen.. 1 By Mouth Two Times A Day (Contraindication To Stimulants) 7)  Promethazine Hcl 25 Mg Tabs (Promethazine Hcl) .Marland Kitchen.. 1 Tab By Mouth Q6 Hours As Needed Nausea  Allergies: 1)  ! Bactrim 2)  ! Sulfa  Past  History:  Past medical, surgical, family and social histories (including risk factors) reviewed, and no changes noted (except as noted below).  Past Medical History: Reviewed history from 12/13/2009 and no changes required. Alcoholic - sober 4098, [facility] [address] 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 Renaldo Fiddler  Past Surgical History: Reviewed history from 10/30/2008 and no changes required. Basket surgery, kidney stones, 2005,6  Family History: Reviewed history from 10/30/2008 and no changes required. GP, ETOH Breast CA, Cousin CVA, GGM  Social History: Reviewed history from 10/30/2008 and no changes required. Occupation: [place]ing [employer], NA Current Smoker  Review of Systems General:  Denies fatigue. CV:  Denies chest pain or discomfort. Resp:  Denies shortness of breath.  Physical Exam  General:  Well-developed,well-nourished,in no acute distress; alert,appropriate and cooperative throughout examination Mouth:  MMM Neck:  no carotid bruit or thyromegaly  Lungs:  Normal respiratory effort, chest expands symmetrically. Lungs are clear to auscultation, no crackles or wheezes. Heart:  Normal rate and regular rhythm. S1 and S2 normal without gallop, murmur, click, rub or other extra sounds. Abdomen:  mild epigatric ttp, no hepatomegaly or paini RUQ, diffuse lower abdominal ttp, no masses, no rebound, no guarding.    Impression & Recommendations:  Problem # 1:  GASTROENTERITIS, VIRAL (ICD-008.8) Vs toxin associated food poisoning. Push fluids, phenergan as needed.  Call if unable to tolerate liquids.  Expect improvement in next 48-72 hours.   Problem # 2:  IBS (ICD-564.1) Constipation predominant, resolving.   Complete Medication List: 1)  Valacyclovir Hcl 500 Mg Tabs (Valacyclovir hcl) .Marland Kitchen.. 1 by mouth as needed 2)   Amitiza 8 Mcg Caps (Lubiprostone) .... Take one tablet two times a day 3)  Allegra 180 Mg Tabs (Fexofenadine hcl) .... Take one tablet daily 4)  Qvar 80 Mcg/act Aers (Beclomethasone dipropionate) .... Two puffs once a day 5)  Proair Hfa 108 (90 Base) Mcg/act Aers (Albuterol sulfate) .... I puff as needed. use sparingly 6)  Strattera 40 Mg Caps (Atomoxetine hcl) .Marland Kitchen.. 1 by mouth two times a day (contraindication to stimulants) 7)  Promethazine Hcl 25 Mg Tabs (Promethazine hcl) .Marland Kitchen.. 1 tab by mouth q6 hours as needed nausea Prescriptions: PROMETHAZINE HCL 25 MG TABS (PROMETHAZINE HCL) 1 tab by mouth q6 hours as needed nausea  #15 x 0   Entered and Authorized by:   Kerby Nora MD   Signed by:   Kerby Nora MD on 04/22/2010   Method used:   [facility] to        [facility] [address] [phone]* (retail)       [address]       [place], [place]  [address]       Ph: [phone] or [phone]       Fax: [phone]   RxID:   [id number]   Current Allergies (reviewed today): ! BACTRIM ! SULFA

## 2010-11-22 NOTE — Assessment & Plan Note (Signed)
Summary: NIPPLE DISCHARGE & UTI & DEEP COUGHING / LFW   Vital Signs:  Patient profile:   35 year old female Height:      61 inches Weight:      155.0 pounds BMI:     29.39 Temp:     98.7 degrees F oral Pulse rate:   80 / minute Pulse rhythm:   regular BP sitting:   110 / 70  (left arm) Cuff size:   regular  Vitals Entered By: Benny Lennert CMA (AAMA) (June 23, 2010 10:30 AM)  History of Present Illness: Chief complaint ? UTI, nipple discharge,deep cough  35 year old female:  UTI? Monday, at the end of the school day, felt some pressure, has been going to the bathroom more.  urgency, pressure. mild pain  Coughing/asthma Coughing. 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 She did not have any problems over the summer. She was not using her Qvar over the summer, but now she has started this again approximately 2 days ago. She has not been having to use her albuterol significantly allergy shots.   qvar, started agin. increase to 2 puffs two times a day   nipple discharge: Actually her primary complaint today is nipple discharge. Several years ago she did have an episode of nipple discharge, where she had a mass that was seen on diagnostic ultrasound, she ultimately underwent diagnostic mammogram, ultrasound, and breast MRI. Ultimately, workup was negative. Second cousin with some breast cancer. sixty now, started in 40.   Current Problems (verified): 1)  Asthma  (ICD-493.90) 2)  Uti  (ICD-599.0) 3)  Nipple Discharge  (ICD-611.79) 4)  Ibs  (ICD-564.1) 5)  Add  (ICD-314.00) 6)  Fatigue  (ICD-780.79) 7)  Hyperlipidemia  (ICD-272.4) 8)  Genital Herpes  (ICD-054.10) 9)  Personal History of Alcoholism  (ICD-V11.3) 10)  Nephrolithiasis, Hx of  (ICD-V13.01) 11)  Allergic Rhinitis  (ICD-477.9)  Allergies: 1)  ! Bactrim 2)  ! Sulfa  Past History:  Past medical, surgical, family and social histories (including risk factors) reviewed, and no changes  noted (except as noted below).  Past Medical History: Reviewed history from 12/13/2009 and no changes required. 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 Renaldo Fiddler  Past Surgical History: Reviewed history from 10/30/2008 and no changes required. Basket surgery, kidney stones, 2005,6  Family History: Reviewed history from 10/30/2008 and no changes required. GP, ETOH Breast CA, Cousin CVA, GGM  Social History: Reviewed history from 10/30/2008 and no changes required. Occupation: [employer]ing [employer], NA Current Smoker  Review of Systems       40-50 pound weight loss, this has been by her choice. Some hair thinning. No chest pain, shortness of breath. Nipple discharge as described. No masses felt.  Physical Exam  General:  Well-developed,well-nourished,in no acute distress; alert,appropriate and cooperative throughout examination Head:  Normocephalic and atraumatic without obvious abnormalities. No apparent alopecia or balding. Ears:  External ear exam shows no significant lesions or deformities.  Otoscopic examination reveals clear canals, tympanic membranes are intact bilaterally without bulging, retraction, inflammation or discharge. Hearing is grossly normal bilaterally. Nose:  External nasal examination shows no deformity or inflammation. Nasal mucosa are pink and moist without lesions or exudates. Mouth:  Oral mucosa and oropharynx without lesions or exudates.  Teeth in good repair. Neck:  No deformities,  masses, or tenderness noted. Breasts:  No mass, nodules, thickening, tenderness, bulging, retraction, inflamation, nipple discharge or skin changes noted.   Lungs:  Normal respiratory effort, chest expands symmetrically. Lungs are clear to auscultation, no crackles or wheezes. Heart:  Normal rate  and regular rhythm. S1 and S2 normal without gallop, murmur, click, rub or other extra sounds. Abdomen:  Bowel sounds positive,abdomen soft and non-tender without masses, organomegaly or hernias noted. Extremities:  No clubbing, cyanosis, edema, or deformity noted with normal full range of motion of all joints.   Neurologic:  alert & oriented X3 and gait normal.   Cervical Nodes:  No lymphadenopathy noted Psych:  Cognition and judgment appear intact. Alert and cooperative with normal attention span and concentration. No apparent delusions, illusions, hallucinations   Impression & Recommendations:  Problem # 1:  NIPPLE DISCHARGE (ICD-611.79) Assessment New nonbloody nipple discharge. History of abnormal diagnostic mammogram. In this case, thing that you have to followup with diagnostic mammogram, with ultrasound if deemed appropriate by radiology.  Urine pregnancy test negative. At the time of this dictation thyroid and prolactin are all negative as well.  Orders: Venipuncture ([address]) Urine Pregnancy Test  ([address]) Radiology Referral (Radiology) TLB-TSH (Thyroid Stimulating Hormone) (84443-TSH) TLB-Prolactin (84146-PROL)  Problem # 2:  UTI (ICD-599.0) Assessment: New  blood on UA, history not consistent with stone  Her updated medication list for this problem includes:    Nitrofurantoin Monohyd Macro 100 Mg Caps (Nitrofurantoin monohyd macro) .Marland Kitchen... 1 by mouth two times a day  Orders: UA Dipstick w/o Micro (manual) ([address])  Problem # 3:  ASTHMA (ICD-493.90) Assessment: Deteriorated restart daily  Her updated medication list for this problem includes:    Qvar 80 Mcg/act Aers (Beclomethasone dipropionate) .Marland Kitchen..Marland Kitchen Two puffs two times a day    Proair Hfa 108 (90 Base) Mcg/act Aers (Albuterol sulfate) ..... I puff as needed. use sparingly  Complete Medication List: 1)  Valacyclovir Hcl 500 Mg Tabs (Valacyclovir hcl) .Marland Kitchen.. 1 by mouth as needed 2)  Amitiza 8 Mcg Caps (Lubiprostone)  .... Take one tablet two times a day 3)  Allegra 180 Mg Tabs (Fexofenadine hcl) .... Take one tablet daily 4)  Qvar 80 Mcg/act Aers (Beclomethasone dipropionate) .... Two puffs two times a day 5)  Proair Hfa 108 (90 Base) Mcg/act Aers (Albuterol sulfate) .... I puff as needed. use sparingly 6)  Strattera 40 Mg Caps (Atomoxetine hcl) .Marland Kitchen.. 1 by mouth two times a day (contraindication to stimulants) 7)  Nitrofurantoin Monohyd Macro 100 Mg Caps (Nitrofurantoin monohyd macro) .Marland Kitchen.. 1 by mouth two times a day  Patient Instructions: 1)  Referral Appointment Information 2)  Day/Date: 3)  Time: 4)  Place/MD: 5)  Address: 6)  Phone/Fax: 7)  Patient given appointment information. Information/Orders faxed/mailed.  Prescriptions: NITROFURANTOIN MONOHYD MACRO 100 MG CAPS (NITROFURANTOIN MONOHYD MACRO) 1 by mouth two times a day  #14 x 0   Entered and Authorized by:   [name] MD   Signed by:   [name] MD on 06/23/2010   Method used:   [facility]        [facility] [address] [phone]* (retail)       [address]       [place], [place]  [address]       Ph: [phone] or [phone]       Fax: [phone]   RxID:   [id number]   Current Allergies (reviewed today): ! BACTRIM ! SULFA  Laboratory Results   Urine Tests  Date/Time Received: June 23, 2010 11:02 AM  Date/Time Reported: June 23, 2010 11:02 AM   Routine Urinalysis   Color: yellow Appearance: Clear Glucose: negative   (Normal Range: Negative) Bilirubin: negative   (Normal Range: Negative) Ketone: negative   (Normal Range: Negative) Spec. Gravity: >=1.030   (Normal Range: 1.003-1.035) Blood: large   (Normal Range: Negative) pH: 6.0   (Normal Range: 5.0-8.0) Protein: negative   (Normal Range: Negative) Urobilinogen: 0.2   (Normal Range: 0-1) Nitrite: negative   (Normal Range: Negative) Leukocyte Esterace: negative   (Normal Range: Negative)    Urine HCG:  negative

## 2010-11-22 NOTE — Letter (Signed)
Summary: ADHD Checklist/[facility]  ADHD Checklist/[facility]   Imported By: Lanelle Bal 11/12/2009 09:18:57  _____________________________________________________________________  External Attachment:    Type:   Image     Comment:   External Document

## 2010-11-22 NOTE — Medication Information (Signed)
Summary: Approval for Strattera/[facility]  Approval for Strattera/[facility]   Imported By: Lanelle Bal 11/23/2009 10:50:26  _____________________________________________________________________  External Attachment:    Type:   Image     Comment:   External Document

## 2010-11-22 NOTE — Progress Notes (Signed)
Summary: Prior Authorization Fexofenadine 180mg   Phone Note From Pharmacy Call back at ph [phone] fax [phone]   [facility] [address] [phone]* Call For: Dr. Patsy Lager  Summary of Call: Received fax from pharmacy stating that PA is needed for Fexofenadine 180mg .  Called [facility] to request the paper work, they are sending it by fax.  Linde Gillis CMA Duncan Dull)  January 14, 2010 8:08 AM   Prior Berkley Harvey form for fexofenadine is on your desk.              Lowella Petties CMA  January 14, 2010 9:43 AM      Appended Document: Prior Authorization Fexofenadine 180mg  Received PA Approval for Fexofenadine.  Approved from 12/27/2009 until 01/17/2011.  Patient and pharmacy notified.

## 2010-11-22 NOTE — Progress Notes (Signed)
Summary: does pt need vaccines?  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  She is going to check on her tetanus status and call back. Initial call taken by: Lowella Petties CMA,  April 19, 2010 9:47 AM  Follow-up for Phone Call        for [place] no Follow-up by: [name] MD,  April 19, 2010 10:46 AM  Additional Follow-up for Phone Call Additional follow up Details #1::        patient advised.Consuello Masse CMA   Additional Follow-up by: Benny Lennert CMA Duncan Dull),  April 19, 2010 10:51 AM

## 2010-11-22 NOTE — Progress Notes (Signed)
Summary: asking about support group  Phone Note Call from Patient Call back at Home Phone [phone]   Caller: Patient Summary of Call: Pt is asking how she can find a support group for herpes pt's, preferably in [place], but she can drive. Initial call taken by: Lowella Petties CMA,  July 07, 2010 4:38 PM  Follow-up for Phone Call        I do not know -- I am going to cc a few of my partners to see if they have any ideas?  cc: Dr. Ermalene Searing, [place] Martes, [place]  I would think you could prob find something on the internet, but thought you may know something or dealt with in the past. Follow-up by: [name] MD,  July 08, 2010 6:27 AM  Additional Follow-up for Phone Call Additional follow up Details #1::        I did not see anything in the [facility] health and you flyer I think she may have to google it  ? perhaps go on valtrex website? Additional Follow-up by: Judith Part MD,  July 08, 2010 8:03 AM    Additional Follow-up for Phone Call Additional follow up Details #2::    I agree..I am not aware of any.Marland Kitchengo to internet.  Follow-up by: Kerby Nora MD,  July 08, 2010 8:47 AM  Additional Follow-up for Phone Call Additional follow up Details #3:: Details for Additional Follow-up Action Taken: Can you call Anjuli:  She asked about a Herpes + support group. I asked several of my partners, and we do not know of anything locally. That does not mean it does not exist, but would be an idea to look through Google, Bing, and maybe the [employer] website. Additional Follow-up by: [name] MD,  July 11, 2010 3:18 PM    Left message for patient to return my call.Consuello Masse CMA    Patient advised.Consuello Masse CMA

## 2010-11-22 NOTE — Assessment & Plan Note (Signed)
Summary: 10 APPT TO DISCUSS GETTING LAB WORK DONE/[facility]   Vital Signs:  Patient profile:   35 year old female Height:      61 inches Weight:      178.0 pounds BMI:     33.75 Temp:     97.4 degrees F oral Pulse rate:   84 / minute Pulse rhythm:   regular BP sitting:   108 / 68  (left arm) Cuff size:   regular  Vitals Entered By: Benny Lennert CMA Duncan Dull) (November 08, 2009 9:57 AM)  History of Present Illness: Chief complaint discuss getting labs done and also add meds and weight lose  35 year old female:  Chol was really bad while in [facility], no f/u for 3 years  June 2007.  Gyn is Zel[facility]. Pap up to date  Diagnosed with ADD in past at [facility]  Finishes ADD adult questionaire today, and it is positive Please see scanned document.  Fatigue.  Needs screening for DM, lipids, basic labs   Obesity, trying to lose weight, has lost some, would like nutriotion referral  Allergies: 1)  ! Bactrim 2)  ! Sulfa  Past History:  Past medical, surgical, family and social histories (including risk factors) reviewed, and no changes noted (except as noted below).  Past Medical History: Alcoholic - sober 0454, [facility] [address] 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 Basket surgery, kidney stones, 2005,6  Family History: Reviewed history from 10/30/2008 and no changes required. GP, ETOH Breast CA, Cousin CVA, GGM  Social History: Reviewed history from 10/30/2008 and no changes required. Occupation: [place] Recovering AA, NA Current Smoker  Review of Systems       ROS: GEN: No acute illnesses, no fevers, chills, sweats, fatigue, weight loss, or URI sx. GI: No n/v/d Pulm: No SOB, cough, wheezing Interactive and getting along well at  home.  Otherwise, ROS is as per the HPI.   Physical Exam  Additional Exam:  GEN: WDWN, NAD, Non-toxic, A & O x 3 HEENT: Atraumatic, Normocephalic. Neck supple. No masses, No LAD. Ears and Nose: No external deformity. CV: RRR, No M/G/R. No JVD. No thrill. No extra heart sounds. PULM: CTA B, no wheezes, crackles, rhonchi. No retractions. No resp. distress. No accessory muscle use. EXTR: No c/c/e NEURO: Normal gait.  PSYCH: Normally interactive. Conversant. Not depressed or anxious appearing.  Calm demeanor.     Impression & Recommendations:  Problem # 1:  HYPERLIPIDEMIA (ICD-272.4) Assessment New FLP  Orders: Venipuncture (09811) TLB-Lipid Panel (80061-LIPID)  Problem # 2:  FATIGUE (ICD-780.79) Assessment: New  Orders: TLB-BMP (Basic Metabolic Panel-BMET) (80048-METABOL) TLB-CBC Platelet - w/Differential (85025-CBCD) TLB-Hepatic/Liver Function Pnl (80076-HEPATIC) TLB-TSH (Thyroid Stimulating Hormone) (84443-TSH) T-Vitamin D (25-Hydroxy) (91478-29562) Specimen Handling ([address]86)  Problem # 3:  OBESITY (ICD-278.00) Assessment: New  Orders: Nutrition Referral (Nutrition)  Problem # 4:  ADD (ICD-314.00) Assessment: New Straterra trial  Complete Medication List: 1)  Valtrex 500 Mg Tabs (Valacyclovir hcl) .... Take one tablet daily as needed 2)  Amitiza 8 Mcg Caps (Lubiprostone) .... Take one tablet two times a day 3)  Allegra 180 Mg Tabs (Fexofenadine hcl) .... Take one tablet daily 4)  Qvar 80 Mcg/act Aers (Beclomethasone dipropionate) .... Two puffs once a day 5)  Proair Hfa 108 (90 Base) Mcg/act Aers (Albuterol sulfate) .... I puff as needed. use sparingly  6)  Strattera 40 Mg Caps (Atomoxetine hcl) .Marland Kitchen.. 1 by mouth two times a day (contraindication to stimulants)  Patient Instructions: 1)  STRATTERA, TAKE 1 by mouth A DAY FOR 3 DAYS, THEN TAKE 1 by mouth two times a day  2)  f/u 1 month, recheck ADD 3)  Referral Appointment Information 4)  Day/Date: 5)  Time: 6)   Place/MD: 7)  Address: 8)  Phone/Fax: 9)  Patient given appointment information. Information/Orders faxed/mailed.  Prescriptions: STRATTERA 40 MG CAPS (ATOMOXETINE HCL) 1 by mouth two times a day (Contraindication to stimulants)  #60 x 3   Entered and Authorized by:   [name] MD   Signed by:   [name] MD on 11/08/2009   Method used:   [facility]        [facility] [address] [phone]* (retail)       [address]       [place], [place]  [address]       Ph: [phone] or [phone]       Fax: [phone]   RxID:   [id number]   Current Allergies (reviewed today): ! BACTRIM ! SULFA

## 2010-11-22 NOTE — Letter (Signed)
Summary: Results Follow up Letter  [place] at [facility]  [address] [place], [place] [address]   Phone: [phone]  Fax: [phone]    06/30/2010 MRN: [id number]  Kennedie GRIFFIN [address] [place], [place]  [address]  Dear Ms. GRIFFIN,  The following are the results of your recent test(s):  Test         Result    Pap Smear:        Normal _____  Not Normal _____ Comments: ______________________________________________________ Cholesterol: LDL(Bad cholesterol):         Your goal is less than:         HDL (Good cholesterol):       Your goal is more than: Comments:  ______________________________________________________ Mammogram:        Normal __X___  Not Normal _____ Comments:  ___________________________________________________________________ Hemoccult:        Normal _____  Not normal _______ Comments:    _____________________________________________________________________ Other Tests:    We routinely do not discuss normal results over the telephone.  If you desire a copy of the results, or you have any questions about this information we can discuss them at your next office visit.   Sincerely,      [name], MD

## 2010-11-22 NOTE — Assessment & Plan Note (Signed)
Summary: STOMACH/CLE   Vital Signs:  Patient profile:   35 year old female Height:      61 inches Weight:      161.0 pounds BMI:     30.53 Temp:     97.5 degrees F oral Pulse rate:   88 / minute Pulse rhythm:   regular BP sitting:   110 / 70  (left arm) Cuff size:   regular  Vitals Entered By: Benny Lennert CMA Duncan Dull) (Mar 10, 2010 4:04 PM)  History of Present Illness: Chief complaint stomach  35 year old female:  Candice Mcgrath, four days of diarrhea.  few people at [facility]grath with diarrhea  does have IBS, has been constipation for last few years no blood or mucous  REVIEW OF SYSTEMS GEN:no fever, chills, sweats. CV: No chest pain or SOB GI: No noted N or V Otherwise, pertinent positives and negatives are noted in the HPI.   GEN: Well-developed,well-nourished,in no acute distress; alert,appropriate and cooperative throughout examination HEENT: Normocephalic and atraumatic without obvious abnormalities. No apparent alopecia or balding. Ears, externally no deformities PULM: Breathing comfortably in no respiratory distress EXT: No clubbing, cyanosis, or edema PSYCH: Normally interactive. Cooperative during the interview. Pleasant. Friendly and conversant. Not anxious or depressed appearing. Normal, full affect.  ABD: s, nt, nd, +BS  Allergies: 1)  ! Bactrim 2)  ! Sulfa   Impression & Recommendations:  Problem # 1:  DIARRHEA (ICD-787.91) viral gastroenteritis more likely vs. IBS flare  immodium as needed   Complete Medication List: 1)  Valacyclovir Hcl 500 Mg Tabs (Valacyclovir hcl) .Marland Kitchen.. 1 by mouth as needed 2)  Amitiza 8 Mcg Caps (Lubiprostone) .... Take one tablet two times a day 3)  Allegra 180 Mg Tabs (Fexofenadine hcl) .... Take one tablet daily 4)  Qvar 80 Mcg/act Aers (Beclomethasone dipropionate) .... Two puffs once a day 5)  Proair Hfa 108 (90 Base) Mcg/act Aers (Albuterol sulfate) .... I puff as needed. use sparingly 6)  Strattera 40 Mg Caps (Atomoxetine hcl) .Marland Kitchen.. 1 by  mouth two times a day (contraindication to stimulants)  Current Allergies (reviewed today): ! BACTRIM ! SULFA  Laboratory Results   Urine Tests  Date/Time Received: Mar 10, 2010 4:10 PM  Date/Time Reported: Mar 10, 2010 4:10 PM   Routine Urinalysis   Color: yellow Appearance: Clear Glucose: negative   (Normal Range: Negative) Bilirubin: negative   (Normal Range: Negative) Ketone: negative   (Normal Range: Negative) Spec. Gravity: >=1.030   (Normal Range: 1.003-1.035) Blood: moderate   (Normal Range: Negative) pH: 7.0   (Normal Range: 5.0-8.0) Protein: negative   (Normal Range: Negative) Urobilinogen: 0.2   (Normal Range: 0-1) Nitrite: negative   (Normal Range: Negative) Leukocyte Esterace: small   (Normal Range: Negative)

## 2010-11-22 NOTE — Progress Notes (Signed)
Summary: asking for note for travel  Phone Note Call from Patient Call back at Home Phone [phone]   Caller: Patient Call For: [name] MD Summary of Call: Pt is going out of the country and is asking if she can have a letter stating that she does need the meds that  she takes. Someone told her it would be a good idea to have a letter.  She is leaving 7/02, so would like the note before then. Initial call taken by: Lowella Petties CMA,  April 08, 2010 2:55 PM  Follow-up for Phone Call        No prob -- I don't think she will need as far as I am aware, but easy enough to write letter with your med list and sign for you.  also print med list and i will sign Follow-up by: [name] MD,  April 10, 2010 2:31 PM  Additional Follow-up for Phone Call Additional follow up Details #1::        done.Consuello Masse CMA   Additional Follow-up by: Benny Lennert CMA Duncan Dull),  April 11, 2010 8:04 AM

## 2010-11-22 NOTE — Progress Notes (Signed)
Summary: Prior Authorization for [employer] Note From Pharmacy   [facility] [address] [phone]* Call For: Dr. Patsy Lager  Summary of Call: Received faxed form from pharmacy, [phone] stating that prior authorization is needed for Strattera 40.  Called [employer] at [phone] to request paper work. Initial call taken by: Linde Gillis CMA Duncan Dull),  November 10, 2009 5:05 PM  Follow-up for Phone Call        will complete Follow-up by: [name] MD,  November 10, 2009 6:15 PM     Appended Document: Prior Authorization for Strattera Received prior authorization form, form in your IN box  Appended Document: Prior Authorization for Strattera Pt left v/m on triage phone to get update on prior authorization. I called pt back and left message prior authorization is in process and will call pt when completed.   Appended Document: Prior Authorization for Strattera Prior authorization forms were faxed back to [employer] today, by [employer].    Appended Document: Prior Authorization for Strattera Pt called to check on status of prior auth. Itold pt prior auth was sent to [employer] today at 10:00am.  Appended Document: Prior Authorization for [employer] for prior authorization for Strattera.  Approved from 10/26/2009 through 11/16/2010.  Left message on patient's personal voicemail advising her of the approval, pharmacy also notified.

## 2010-11-22 NOTE — Letter (Signed)
Summary: Generic Letter  [place] at [facility]  [address] [place], [place] [address]   Phone: [phone]  Fax: [phone]    04/10/2010  Candice Mcgrath [address] [place], [place]  [address]  TO WHOM IT MAY CONCERN,   Ms. [name] is a patient in our office and takes the medications listed on the next page under our direction.        Sincerely,   [name] MD  Appended Document: Generic Letter printed.Consuello Masse CMA

## 2010-11-22 NOTE — Medication Information (Signed)
Summary: Approval for Fexofenadine/[employer]  Approval for Fexofenadine/[employer]   Imported By: Lanelle Bal 01/22/2010 08:38:25  _____________________________________________________________________  External Attachment:    Type:   Image     Comment:   External Document

## 2010-11-23 ENCOUNTER — Encounter: Payer: Self-pay | Admitting: Family Medicine

## 2010-11-23 ENCOUNTER — Ambulatory Visit (INDEPENDENT_AMBULATORY_CARE_PROVIDER_SITE_OTHER): Payer: BC Managed Care – PPO | Admitting: Family Medicine

## 2010-11-23 ENCOUNTER — Encounter (INDEPENDENT_AMBULATORY_CARE_PROVIDER_SITE_OTHER): Payer: Self-pay | Admitting: *Deleted

## 2010-11-23 DIAGNOSIS — R300 Dysuria: Secondary | ICD-10-CM

## 2010-11-23 DIAGNOSIS — L03317 Cellulitis of buttock: Secondary | ICD-10-CM

## 2010-11-23 DIAGNOSIS — N898 Other specified noninflammatory disorders of vagina: Secondary | ICD-10-CM

## 2010-11-23 LAB — CONVERTED CEMR LAB
Glucose, Urine, Semiquant: NEGATIVE
KOH Prep: NEGATIVE
Nitrite: NEGATIVE
Specific Gravity, Urine: 1.015
pH: 6

## 2010-11-24 NOTE — Progress Notes (Signed)
Summary: regarding valtrex  Phone Note Call from Patient Call back at Work Phone [phone]   Caller: Patient Call For: [name] MD Summary of Call: Pt is asking if ok to take 2 valtrex if she has an outbreak, one in the morning and one at night. Initial call taken by: Lowella Petties CMA, AAMA,  October 31, 2010 8:53 AM  Follow-up for Phone Call        2 tabs at once daily for 5 days is sufficient Follow-up by: [name] MD,  October 31, 2010 9:41 AM  Additional Follow-up for Phone Call Additional follow up Details #1::        Left message on patients cell phone with instructions b/c patient says her phone does not ring at times.Consuello Masse CMA   Additional Follow-up by: Benny Lennert CMA Duncan Dull),  October 31, 2010 10:51 AM

## 2010-11-24 NOTE — Medication Information (Signed)
Summary: PA and Approval for Strattera Capsule  PA and Approval for Strattera Capsule   Imported By: Maryln Gottron 11/15/2010 14:04:25  _____________________________________________________________________  External Attachment:    Type:   Image     Comment:   External Document

## 2010-11-24 NOTE — Progress Notes (Signed)
Summary: junel  Phone Note Call from Patient Call back at Home Phone [phone]   Caller: Patient Call For: Candice Mcgrath Summary of Call: Patient has been taking junel FE 1/20 that was prescribed by her OBGYN. She is asking if you are willing to fill this for her. it is not on her med list. [facility] Initial call taken by: Melody Comas,  November 11, 2010 2:49 PM  Follow-up for Phone Call        ok, 1 pack, use as directed, 11 refills.  Candice Mcgrath  November 13, 2010 11:24 AM   Additional Follow-up for Phone Call Additional follow up Details #1::        rx called to pharmacy.Consuello Masse CMA   Additional Follow-up by: Benny Lennert CMA Duncan Dull),  November 14, 2010 8:11 AM    New/Updated Medications: JUNEL 1/20 1-20 MG-MCG TABS (NORETHINDRONE ACET-ETHINYL EST) take as directed Prescriptions: JUNEL 1/20 1-20 MG-MCG TABS (NORETHINDRONE ACET-ETHINYL EST) take as directed  #1 x 11   Entered by:   Benny Lennert CMA (AAMA)   Authorized by:   Candice Mcgrath   Signed by:   Benny Lennert CMA (AAMA) on 11/14/2010   Method used:   [facility]        [facility] [address] [phone]* (retail)       [address]       [place], [place]  [address]       Ph: [phone] or [phone]       Fax: [phone]   RxID:   [id number]

## 2010-11-24 NOTE — Progress Notes (Signed)
Summary: prior Berkley Harvey is needed for strattera  Phone Note From Pharmacy   Caller: [employer] Summary of Call: Prior Berkley Harvey is needed for strattera, form is on your desk.  This is a renewal. Initial call taken by: Lowella Petties CMA, AAMA,  November 03, 2010 11:25 AM  Follow-up for Phone Call        done [facility] MD  November 03, 2010 11:39 AM      Appended Document: prior Berkley Harvey is needed for strattera Prior auth given for strattera, approval letter is on your desk.

## 2010-11-24 NOTE — Assessment & Plan Note (Signed)
Summary: FOLLOW UP WITH SCIATIC PAIN   Vital Signs:  Patient profile:   35 year old female Height:      61 inches Weight:      163.25 pounds BMI:     30.96 Temp:     98.7 degrees F oral Pulse rate:   80 / minute Pulse rhythm:   regular BP sitting:   120 / 80  (left arm) Cuff size:   regular  Vitals Entered By: Benny Lennert CMA Duncan Dull) (November 02, 2010 3:37 PM)  History of Present Illness: Chief complaint sciatic pain and also herpes outbreak  1. Sciatica right now, it is not very symptomatic, she did have 2 episodes in December. She wonders what it is okay to begin exercising again. Right now she does not have any radiculopathy.   2. Herpes:  current herpes outbreak, the patient is quite distraught right now. She is tearful. She does currently have a new partner and boyfriend. She is going to a support group in [place].     Allergies: 1)  ! Bactrim 2)  ! Sulfa  Past History:  Past medical, surgical, family and social histories (including risk factors) reviewed, and no changes noted (except as noted below).  Past Medical History: Reviewed history from 07/05/2010 and no changes required. 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 Renaldo Fiddler URO = Dr. Brunilda Payor  Past Surgical History: Reviewed history from 10/30/2008 and no changes required. Basket surgery, kidney stones, 2005,6  Family History: Reviewed history from 10/30/2008 and no changes required. GP, ETOH Breast CA, Cousin CVA, GGM  Social History: Reviewed history from 10/30/2008 and no changes required. Occupation: [place], [employer] Recovering AA, NA Current Smoker  Review of Systems       as above, does appear to be distraught and crying right now. Labile and tearful in the office today. She was examined with my nurse in the room in its  entirety.  Physical Exam  General:  Well-developed,well-nourished,in no acute distress; alert,appropriate and cooperative throughout examination Head:  Normocephalic and atraumatic without obvious abnormalities. No apparent alopecia or balding. Ears:  no external deformities.   Nose:  no external deformity.   Genitalia:  externally normal, there is a small lesion on the right labia it is tender to palpation. There is no ulceration at this point. In the inferior aspect, where the patient has concern for potential lesion, do not see or appreciate any ulcer or herpetic outbreak. There is no significant discharge.   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 b.i.d.  Them for suppression, Valtrex 1 g p.o. daily.  She may call to have herpes PCR done for identification of type I versus type II.  Problem # 2:  SCIATICA (ICD-724.3) Assessment: Improved chronic sciatica, reviewed range of motion stretching. Okay to begin exercise.  The following medications were removed from the medication list:    Diclofenac Sodium 75 Mg Tbec (Diclofenac sodium) ..... One by mouth two times a day as needed flank pain  Complete Medication List: 1)  Valacyclovir Hcl 1 Gm Tabs (Valacyclovir hcl) .Marland Kitchen.. 1 by mouth daily 2)  Amitiza 8 Mcg Caps (Lubiprostone) .... Take one tablet two times a day 3)  Fexofenadine Hcl 180 Mg Tabs (Fexofenadine hcl) .Marland Kitchen.. 1 by mouth daily 4)  Qvar 80 Mcg/act Aers (  Beclomethasone dipropionate) .... Two puffs two times a day 5)  Proair Hfa 108 (90 Base) Mcg/act Aers (Albuterol sulfate) .... I puff as needed. use sparingly 6)  Strattera 40 Mg Caps (Atomoxetine hcl) .Marland Kitchen.. 1 by mouth two times a day (contraindication to stimulants) 7)  Lidocaine-prilocaine 2.5-2.5 % Crea (Lidocaine-prilocaine) .... Apply 4 times daily as needed to affected area, 1 month supply  Patient Instructions: 1)  f/u as  needed Prescriptions: FEXOFENADINE HCL 180 MG TABS (FEXOFENADINE HCL) 1 by mouth daily  #30 x 11   Entered and Authorized by:   [name] MD   Signed by:   [name] MD on 11/02/2010   Method used:   [facility] to        [facility] [address] [phone]* (retail)       [address]       [place], [place]  [address]       Ph: [phone] or [phone]       Fax: [phone]   RxID:   [id number] LIDOCAINE-PRILOCAINE 2.5-2.5 % CREA (LIDOCAINE-PRILOCAINE) Apply 4 times daily as needed to affected area, 1 month supply  #1 x 2   Entered and Authorized by:   [name] MD   Signed by:   [name] MD on 11/02/2010   Method used:   Electron[facility] to        [facility] [address] [phone]* (retail)       [address]       [place], [place]  [address]       Ph: [phone] or [phone]       Fax: [phone]   RxID:   [id number] VALACYCLOVIR HCL 1 GM TABS (VALACYCLOVIR HCL) 1 by mouth daily  #30 x 11   Entered and Authorized by:   [name] MD   Signed by:   [name] MD on 11/02/2010   Method used:   Electronically to        [facility]  [facility] [address] [phone]* (retail)       [address]       [place], [place]  [address]       Ph: [phone] or [phone]       Fax: [phone]   RxID:   [id number]    Orders Added: 1)  Est. Patient Level III [[address]]    Current Allergies (reviewed today): ! BACTRIM ! SULFA

## 2010-11-25 ENCOUNTER — Telehealth: Payer: Self-pay | Admitting: Family Medicine

## 2010-11-28 ENCOUNTER — Telehealth (INDEPENDENT_AMBULATORY_CARE_PROVIDER_SITE_OTHER): Payer: Self-pay | Admitting: *Deleted

## 2010-11-30 NOTE — Assessment & Plan Note (Signed)
Summary: bump on bottom/alc   Vital Signs:  Patient profile:   35 year old female Weight:      160 pounds Temp:     98.3 degrees F oral Pulse rate:   96 / minute Pulse rhythm:   regular BP sitting:   114 / 74  (left arm) Cuff size:   regular  Vitals Entered By: Selena Batten Dance CMA Duncan Dull) (November 23, 2010 12:37 PM) CC: Check bumo on buttock,? BV/yeast/UTI   History of Present Illness: CC: bump on bottom, UTI?, vag discharge  1. bump - L buttock bump, noticed 2 days ago.  Hurts to sit on it, hurts when pants rubbing against it.  No break in skin.  No discharge, bleeding.  No recent trauma.  Has had bumps on bottom before.  has had boils before  2. ? infection - some vag discharge and smell.  Milky, more malodorous than usual.  Doesn't feel like UTI.  Has had UTIs in past.  No abd pain, f/c/v.  No flank pain.  mild nausea.  no dysuria, no frequency or urgency.  did have some flank pain a few days ago, resolved.  known kidney stone.  no current changes in sexual activity, no new partners, no h/o STIs except herpes.  LMP - mid Jan.  currently on 2nd week of pill.  Allergies: 1)  ! Bactrim 2)  ! Sulfa  Past History:  Past Medical History: Last updated: 07/05/2010 Alcoholic - sober 0454, [facility] [address] 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 Renaldo Fiddler URO = Dr. Brunilda Payor  Social History: Last updated: 10/30/2008 Occupation: [employer], [employer] Recovering AA, NA Current Smoker  Review of Systems       per HPI  Physical Exam  General:  Well-developed,well-nourished,in no acute distress; alert,appropriate and cooperative throughout examination Mouth:  Oral mucosa and oropharynx without lesions or exudates.  Teeth in good repair.  MMM Lungs:  Normal respiratory effort, chest expands symmetrically. Lungs are clear to auscultation, no crackles or  wheezes. Heart:  Normal rate and regular rhythm. S1 and S2 normal without gallop, murmur, click, rub or other extra sounds. Abdomen:  Bowel sounds positive,abdomen soft and non-tender without masses, organomegaly or hernias noted.  no CVA tenderness Genitalia:  Pelvic Exam:        External: normal female genitalia without lesions or masses        Vagina: normal without lesions or masses, small amt white discharge        Cervix: normal without lesions or masses        Adnexa: normal bimanual exam without masses or fullness.          Uterus: normal by palpation, no CMT, no discharge        Pap smear: not performed Pulses:  2+ periph pulses Skin:  boil L inferior buttock with mild surrounding erythema, + induration, no fluctuance.  total about 2cm in size   Impression & Recommendations:  Problem # 1:  CELLULITIS AND ABSCESS OF BUTTOCK (ICD-682.5) warm compresses.  return if coming to head.  doxy.  sulfa allergy.  Her updated medication list for this problem includes:    Doxycycline Hyclate 100 Mg Caps (Doxycycline hyclate) .Marland Kitchen... Take one twice daily x 10 days  Problem # 2:  LEUKORRHEA (ICD-623.5) wet prep negative for infection, however possible budding yeast on regular slide.  treat most common infx with diflucan x  1.  return if not resolved for further eval.  Orders: Wet Prep ([id number])  Complete Medication List: 1)  Valacyclovir Hcl 1 Gm Tabs (Valacyclovir hcl) .Marland Kitchen.. 1 by mouth daily 2)  Amitiza 8 Mcg Caps (Lubiprostone) .... Take one tablet two times a day 3)  Fexofenadine Hcl 180 Mg Tabs (Fexofenadine hcl) .Marland Kitchen.. 1 by mouth daily 4)  Qvar 80 Mcg/act Aers (Beclomethasone dipropionate) .... Two puffs two times a day 5)  Proair Hfa 108 (90 Base) Mcg/act Aers (Albuterol sulfate) .... I puff as needed. use sparingly 6)  Strattera 40 Mg Caps (Atomoxetine hcl) .Marland Kitchen.. 1 by mouth two times a day (contraindication to stimulants) 7)  Lidocaine-prilocaine 2.5-2.5 % Crea (Lidocaine-prilocaine) ....  Apply 4 times daily as needed to affected area, 1 month supply 8)  Junel 1/20 1-20 Mg-mcg Tabs (Norethindrone acet-ethinyl est) .... Take as directed 9)  Doxycycline Hyclate 100 Mg Caps (Doxycycline hyclate) .... Take one twice daily x 10 days 10)  Fluconazole 150 Mg Tabs (Fluconazole) .... Take one for yeast  Other Orders: UA Dipstick W/ Micro (manual) (56433)  Patient Instructions: 1)  For spot on bottom - warm compresses three times a day, return if coming to head. 2)  Abx course for that. 3)  treat vag discharge with diflucan x 1.  if not resolving, let us know.  if worsening let us know. 4)  urine looking ok today. Prescriptions: FLUCONAZOLE 150 MG TABS (FLUCONAZOLE) take one for yeast  #1 x 0   Entered and Authorized by:   Eustaquio Boyden  MD   Signed by:   Eustaquio Boyden  MD on 11/23/2010   Method used:   [facility]        [facility] [address] [phone]* (retail)       [address]       [place], [place]  [address]       Ph: [phone] or [phone]       Fax: [phone]   RxID:   [id number] DOXYCYCLINE HYCLATE 100 MG CAPS (DOXYCYCLINE HYCLATE) take one twice daily x 10 days  #20 x 0   Entered and Authorized by:   Eustaquio Boyden  MD   Signed by:   Eustaquio Boyden  MD on 11/23/2010   Method used:   Electronically to        [facility] [address] [phone]* (retail)       [address]       [place], [place]  [address]       Ph: [phone] or [phone]       Fax: [phone]   RxID:   [id number]    Orders Added: 1)  Est. Patient Level III [35361] 2)  UA Dipstick W/ Micro (manual) [81000] 3)  Wet Prep [44315QM]    Current Allergies (reviewed today): ! BACTRIM ! SULFA  Laboratory Results   Urine Tests  Date/Time Received: November 23, 2010  Date/Time Reported: November 23, 2010 1:21 PM   Routine Urinalysis   Color: lt. yellow Appearance: Clear Glucose: negative   (Normal Range:  Negative) Bilirubin: negative   (Normal Range: Negative) Ketone: negative   (Normal Range: Negative) Spec. Gravity: 1.015   (Normal Range: 1.003-1.035) Blood: moderate   (Normal Range: Negative) pH: 6.0   (Normal Range: 5.0-8.0) Protein: negative   (Normal Range: Negative) Urobilinogen: 0.2   (Normal Range: 0-1) Nitrite: negative   (Normal  Range: Negative) Leukocyte Esterace: trace   (Normal Range: Negative)  Urine Microscopic WBC/HPF: rare RBC/HPF: 3-5 Bacteria/HPF: tr Mucous/HPF: scant Epithelial/HPF: rare Crystals/HPF: no Casts/LPF: no Yeast/HPF: no    Comments: read by ......................Eustaquio Boyden  MD  November 23, 2010 12:52 PM  no UCx sent.   Wet Mount Source: vag WBC/hpf: >20 Bacteria/hpf: 2+  Rods Clue cells/hpf: none  Negative whiff Yeast/hpf: few Wet Mount KOH: Negative Trichomonas/hpf: none Comments: read by ...........Eustaquio Boyden  MD  November 23, 2010 1:14 PM     Patient Instructions: 1)  For spot on bottom - warm compresses three times a day, return if coming to head. 2)  Abx course for that. 3)  treat vag discharge with diflucan x 1.  if not resolving, let us know.  if worsening let us know. 4)  urine looking ok today.

## 2010-11-30 NOTE — Progress Notes (Signed)
Summary: strattra  Phone Note Refill Request Message from:  Scriptline on November 25, 2010 7:34 AM  Refills Requested: Medication #1:  STRATTERA 40 MG CAPS 1 by mouth two times a day (Contraindication to stimulants)   Supply Requested: [facility] year [facility]   Method Requested: Telephone to Pharmacy Initial call taken by: Benny Lennert CMA (AAMA),  November 25, 2010 7:35 AM    Prescriptions: STRATTERA 40 MG CAPS (ATOMOXETINE HCL) 1 by mouth two times a day (Contraindication to stimulants)  #60 x 11   Entered and Authorized by:   [name] MD   Signed by:   [name] MD on 11/25/2010   Method used:   Electronically to        [facility] [address] [phone]* (retail)       [address]       [place], [place]  [address]       Ph: [phone] or [phone]       Fax: [phone]   RxID:   [id number]

## 2010-11-30 NOTE — Letter (Signed)
Summary: Out of Work  [facility]  [address] [place], [place] [address]   Phone: [phone]  Fax: [phone]    November 23, 2010   Employee:  Symphoni E GRIFFIN    To Whom It May Concern:   For Medical reasons, please excuse the above named employee from work for the following dates:  Start:  November 23, 2010   End:  November 23, 2010   If you need additional information, please feel free to contact our office.         Sincerely,       Selena Batten Dance CMA (AAMA)

## 2010-12-02 ENCOUNTER — Encounter: Payer: Self-pay | Admitting: Family Medicine

## 2010-12-02 ENCOUNTER — Ambulatory Visit (INDEPENDENT_AMBULATORY_CARE_PROVIDER_SITE_OTHER): Payer: BC Managed Care – PPO | Admitting: Family Medicine

## 2010-12-02 DIAGNOSIS — K921 Melena: Secondary | ICD-10-CM

## 2010-12-08 NOTE — Assessment & Plan Note (Signed)
Summary: RED STOOLS   Vital Signs:  Patient profile:   35 year old female Height:      61 inches Weight:      159.50 pounds BMI:     30.25 Temp:     97.5 degrees F oral Pulse rate:   88 / minute Pulse rhythm:   regular BP sitting:   110 / 70  (left arm) Cuff size:   regular  Vitals Entered By: Benny Lennert CMA Duncan Dull) (December 02, 2010 3:40 PM)  History of Present Illness: Chief complaint red stools  35 year old female with 2 day history of bright red blood streaks on stool. Did not notice it on stool this AM.  No drips in toilet. No rectal pain. Stool is firm, straining to get BMS out.  Using amitiza for constipation chronically.... using intermittantly...when she takes both doses she does not have to strain.    Has history of kidney stones.. has note occ blood in urine... not really new for her.  No CVA tenderness.  Having regular menses.Marland Kitchen LMP early 11/2010    Has hx of IBS.  Never had colonoscopy in past.     Problems Prior to Update: 1)  Cellulitis and Abscess of Buttock  (ICD-682.5) 2)  Leukorrhea  (ICD-623.5) 3)  Sciatica  (ICD-724.3) 4)  Asthma  (ICD-493.90) 5)  Nipple Discharge  (ICD-611.79) 6)  Ibs  (ICD-564.1) 7)  Add  (ICD-314.00) 8)  Fatigue  (ICD-780.79) 9)  Hyperlipidemia  (ICD-272.4) 10)  Genital Herpes  (ICD-054.10) 11)  Personal History of Alcoholism  (ICD-V11.3) 12)  Nephrolithiasis, Hx of  (ICD-V13.01) 13)  Allergic Rhinitis  (ICD-477.9)  Current Medications (verified): 1)  Valacyclovir Hcl 1 Gm Tabs (Valacyclovir Hcl) .Marland Kitchen.. 1 By Mouth Daily 2)  Amitiza 8 Mcg Caps (Lubiprostone) .... Take One Tablet Two Times A Day 3)  Fexofenadine Hcl 180 Mg Tabs (Fexofenadine Hcl) .Marland Kitchen.. 1 By Mouth Daily 4)  Qvar 80 Mcg/act Aers (Beclomethasone Dipropionate) .... Two Puffs Two Times A Day 5)  Proair Hfa 108 (90 Base) Mcg/act Aers (Albuterol Sulfate) .... I Puff As Needed. Use Sparingly 6)  Strattera 40 Mg Caps (Atomoxetine Hcl) .Marland Kitchen.. 1 By Mouth Two Times  A Day (Contraindication To Stimulants) 7)  Lidocaine-Prilocaine 2.5-2.5 % Crea (Lidocaine-Prilocaine) .... Apply 4 Times Daily As Needed To Affected Area, 1 Month Supply 8)  Junel 1/20 1-20 Mg-Mcg Tabs (Norethindrone Acet-Ethinyl Est) .... Take As Directed  Allergies: 1)  ! Bactrim 2)  ! Sulfa  Past History:  Past medical, surgical, family and social histories (including risk factors) reviewed, and no changes noted (except as noted below).  Past Medical History: Reviewed history from 07/05/2010 and no changes required. 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 Renaldo Fiddler URO = Dr. Brunilda Payor  Past Surgical History: Reviewed history from 10/30/2008 and no changes required. Basket surgery, kidney stones, 2005,6  Family History: Reviewed history from 10/30/2008 and no changes required. GP, ETOH Breast CA, Cousin CVA, GGM  Social History: Reviewed history from 10/30/2008 and no changes required. Occupation: [employer], [employer] Recovering AA, NA Current Smoker  Physical Exam  General:  overweight female inNAD  Mouth:  MMM Neck:  no carotid bruit or thyromegaly no cervical or supraclavicular lymphadenopathy  Lungs:  Normal respiratory effort, chest expands symmetrically. Lungs are clear to auscultation, no crackles or wheezes. Heart:  Normal rate and regular  rhythm. S1 and S2 normal without gallop, murmur, click, rub or other extra sounds. Abdomen:  Bowel sounds positive,abdomen soft and non-tender without masses, organomegaly or hernias noted. Rectal:  anoscopy: int hemmorhoids, non bleeding, grade 1    Impression & Recommendations:  Problem # 1:  HEMATOCHEZIA (ICD-578.1) Due to internal hemmorhoids.  Treat with rectal suppositories and better control of constipation. Tsake amitiza regularly and increase fiber,  water and exercsie.  Orders: Anoscopy (16109) UA Dipstick w/o Micro (manual) (60454)  Problem # 2:  NEPHROLITHIASIS, HX OF (ICD-V13.01) Trace blood  in urine likley due to stones in kidney. Assymptomatic currently.   Complete Medication List: 1)  Valacyclovir Hcl 1 Gm Tabs (Valacyclovir hcl) .Marland Kitchen.. 1 by mouth daily 2)  Amitiza 8 Mcg Caps (Lubiprostone) .... Take one tablet two times a day 3)  Fexofenadine Hcl 180 Mg Tabs (Fexofenadine hcl) .Marland Kitchen.. 1 by mouth daily 4)  Qvar 80 Mcg/act Aers (Beclomethasone dipropionate) .... Two puffs two times a day 5)  Proair Hfa 108 (90 Base) Mcg/act Aers (Albuterol sulfate) .... I puff as needed. use sparingly 6)  Strattera 40 Mg Caps (Atomoxetine hcl) .Marland Kitchen.. 1 by mouth two times a day (contraindication to stimulants) 7)  Lidocaine-prilocaine 2.5-2.5 % Crea (Lidocaine-prilocaine) .... Apply 4 times daily as needed to affected area, 1 month supply 8)  Junel 1/20 1-20 Mg-mcg Tabs (Norethindrone acet-ethinyl est) .... Take as directed 9)  Hydrocortisone Acetate 25 Mg Supp (Hydrocortisone acetate) .Marland Kitchen.. 1 supp two times a day x 5 days   Patient Instructions: 1)  Rectal suppositories for internal hemmorhoids. 2)  Increase fiber and water in diet. 3)  Take amitiza EVERY day twice a day. 4)   Call if symptoms not resolving. 5)    Prescriptions: HYDROCORTISONE ACETATE 25 MG SUPP (HYDROCORTISONE ACETATE) 1 supp two times a day x 5 days  #10 x 0   Entered and Authorized by:   Kerby Nora MD   Signed by:   Kerby Nora MD on 12/02/2010   Method used:   [facility]ron[facility] to        [facility] [address] [phone]* (retail)       [address]       [place], [place]  [address]       Ph: [phone] or [phone]       Fax: [phone]   RxID:   [id number]    Orders Added: 1)  Est. Patient Level III [25956] 2)  Anoscopy [46600] 3)  UA Dipstick w/o Micro (manual) [81002]    Current Allergies (reviewed today): ! BACTRIM ! SULFA

## 2010-12-08 NOTE — Progress Notes (Signed)
Summary: still has vaginal symptoms  Phone Note Call from Patient Call back at Home Phone [phone]   Caller: Patient Summary of Call: Pt states her vaginal sxs have not improved and she is asking if she needs to come in to be checked for STD's.  Also, she says she has been taking doxycycline with food and doing fine with that, but today she took it on an empty stomach and threw up.  I told her to take it with food. Initial call taken by: Lowella Petties CMA, AAMA,  November 28, 2010 9:25 AM  Follow-up for Phone Call        call and find out what is going on here. she had a herpes outbreak a few weeks ago, but Dr. Reece Agar just saw her with some cellulitis.  I am confused by this message.  doxy often makes people sick on an empty stomache.  is this herpes, the "bump on her bottom", or what? more detail. Follow-up by: [name] MD,  November 28, 2010 1:19 PM  Additional Follow-up for Phone Call Additional follow up Details #1::        Patient says that its not the vaginal symptoms that arent getting better she just thinks that she just got upset but, just wants to know if she should get tested for STD. Because Dr. Reece Agar asked her if she is having sexual partner. Additional Follow-up by: Benny Lennert CMA Duncan Dull),  November 28, 2010 1:50 PM    Additional Follow-up for Phone Call Additional follow up Details #2::    Patient did try to make an appt on 11-28-2010 but, their were not any appt today.Consuello Masse CMA   Follow-up by: Benny Lennert CMA Duncan Dull),  November 28, 2010 1:54 PM

## 2011-04-15 ENCOUNTER — Encounter: Payer: Self-pay | Admitting: Family Medicine

## 2011-04-19 ENCOUNTER — Ambulatory Visit (INDEPENDENT_AMBULATORY_CARE_PROVIDER_SITE_OTHER): Payer: BC Managed Care – PPO | Admitting: Family Medicine

## 2011-04-19 ENCOUNTER — Encounter: Payer: Self-pay | Admitting: Family Medicine

## 2011-04-19 VITALS — BP 110/72 | HR 92 | Temp 98.1°F | Ht 61.0 in | Wt 167.2 lb

## 2011-04-19 DIAGNOSIS — G44209 Tension-type headache, unspecified, not intractable: Secondary | ICD-10-CM

## 2011-04-19 DIAGNOSIS — Z309 Encounter for contraceptive management, unspecified: Secondary | ICD-10-CM

## 2011-04-19 MED ORDER — LEVONORGESTREL-ETHINYL ESTRAD 0.1-20 MG-MCG PO TABS: 1.0000 | ORAL_TABLET | Freq: Every day | ORAL | Status: DC

## 2011-04-19 NOTE — Patient Instructions (Signed)
Acute Headache: Tylenol -- 2 tablets up to 4 times a day Ibuprofen - 4 over the counter tablets up to three times a day * Take Tylenol and Ibuprofen with a cup of coffee  (A different option would be Excedrin Migraine - very similar)  Tension Headache (Muscle Contraction Headache) Tension headache is one of the most common causes of head pain. These headaches are usually felt as a pain over the top of your head and back of your neck. Stress, anxiety, and depression are common triggers for these headaches. Tension headaches are not life-threatening and will not lead to other types of headaches. Tension headaches can often be diagnosed by taking a history from the patient and a physical exam. Sometimes, further lab and x-ray studies are used to confirm the diagnosis. Your caregiver can advise you on how to get help solving problems that cause anxiety or stress. Antidepressants can be prescribed if depression is a problem. HOME CARE INSTRUCTIONS  If testing was done, call for your results. Remember, it is your responsibility to get the results of all testing. Do not assume everything is fine because you do not hear from your caregiver.   Only take over-the-counter or prescription medicines for pain, discomfort, or fever as directed by your caregiver.   Biofeedback, massage, or other relaxation techniques may be helpful.   Ice packs or heat to the head and neck can be used. Use these three to four times per day or as needed.   Physical therapy may be a useful addition to treatment.   If headaches continue, even with therapy, you may need to think about lifestyle changes.   Avoid excessive use of pain killers, as rebound headaches can occur.  1.  SEEK MEDICAL CARE IF:  You develop problems with medications prescribed.   You do not respond or get no relief from medications.   You have a change from the usual headache.   You develop nausea (feeling sick to your stomach) or vomiting.  SEEK  IMMEDIATE MEDICAL CARE IF:   Your headache becomes severe.   You have an unexplained oral temperature above 102.   You develop a stiff neck.   You have loss of vision.   You have muscular weakness.   You have loss of muscular control.   You develop severe symptoms different from your first symptoms.   You start losing your balance or have trouble walking.   You feel faint or pass out.  MAKE SURE YOU:   Understand these instructions.   Will watch your condition.   Will get help right away if you are not doing well or get worse.  Document Released: 10/09/2005 Document Re-Released: 01/05/2009 [facility] Patient Information 2011 [place], [place].

## 2011-04-19 NOTE — Progress Notes (Signed)
Candice Mcgrath, a 35 y.o. female presents today in the office for the following:   Headaches -- had some allergy meds that have been   4 advil will not help Back of neck  Never been this common Will last for a few hours,   Had for about a month, then here again Not along with cycle No aura, no visual changes, no photophobia, no photophobia, no nausea, no vomiting associated with this. 3-6 months ago she was very well controlled. The patient does not have a personal history of migraine. There is no family history of migraine or other chronic headache conditions.  She is taking a monophasic mid potent estrogen progesterone combination pill for OCPs. No other classically offending agents in terms of medications. No over-the-counter medications.  Patient Active Problem List  Diagnoses  . GENITAL HERPES  . HYPERLIPIDEMIA  . ADD  . ALLERGIC RHINITIS  . ASTHMA  . IBS  . FATIGUE  . PERSONAL HISTORY OF ALCOHOLISM  . NEPHROLITHIASIS, HX OF  . SCIATICA   Past Medical History  Diagnosis Date  . ETOH abuse     sober 2007, [facility]  . 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 Headache   . IBS (irritable bowel syndrome)   . Allergic rhinitis   . Elevated BP   . Herpes   . Nephrolithiasis   . Hyperlipidemia    Past Surgical History  Procedure Date  . Basket surgery 2005, 06    kidney stones   History  Substance Use Topics  . Smoking status: Current Everyday Smoker  . Smokeless tobacco: Not on file  . Alcohol Use: Not on file     recovering AA   Family History  Problem Relation Age of Onset  . Alcohol abuse Other     GP  . Breast cancer Cousin   . Stroke Other     GGM   Allergies  Allergen Reactions  . Sulfamethoxazole W/Trimethoprim     REACTION: itching  . Sulfonamide Derivatives     REACTION: itching   Current Outpatient Prescriptions on File Prior to Visit  Medication Sig Dispense Refill  . albuterol (PROAIR HFA) 108 (90 BASE) MCG/ACT inhaler  Inhale 2 puffs into the lungs as needed. Use sparingly       . atomoxetine (STRATTERA) 40 MG capsule Take 40 mg by mouth 2 (two) times daily. (contraindocation to stimulants)       . beclomethasone (QVAR) 80 MCG/ACT inhaler Inhale 2 puffs into the lungs 2 (two) times daily.        . fexofenadine (ALLEGRA) 180 MG tablet Take 180 mg by mouth daily.        Marland Kitchen lubiprostone (AMITIZA) 8 MCG capsule Take 8 mcg by mouth 2 (two) times daily with a meal.        . valACYclovir (VALTREX) 1000 MG tablet Take 1,000 mg by mouth daily.        . hydrocortisone (ANUSOL-HC) 25 MG suppository Place 25 mg rectally 2 (two) times daily. For 5 days       . lidocaine-prilocaine (EMLA) cream Apply topically. Four times a day to affected area, 1 month supply        ROS: GEN: No acute illnesses, no fevers, chills. GI: No n/v/d, eating normally Pulm: No SOB Interactive and getting along well at home.  Otherwise, ROS is as per the HPI.   Physical Exam  Blood pressure 110/72, pulse 92, temperature 98.1 F (36.7  C), temperature source Oral, height 5\' 1"  (1.549 m), weight 167 lb 3.2 oz (75.841 kg), SpO2 99.00%.  GEN: WDWN, NAD, Non-toxic, A & O x 3 HEENT: Atraumatic, Normocephalic. Neck supple. No masses, No LAD. Ears and Nose: No external deformity. CV: RRR, No M/G/R. No JVD. No thrill. No extra heart sounds. PULM: CTA B, no wheezes, crackles, rhonchi. No retractions. No resp. distress. No accessory muscle use. Some tension in posterior neck EXTR: No c/c/e NEURO Normal gait.  PSYCH: Normally interactive. Conversant. Not depressed or anxious appearing.  Calm demeanor.   Assessment and Plan: 1.  Headaches: Tension type. Discussed some relaxation strategies. Change to a different, lower monophasic or control pill. Tylenol, Advil, and caffeine for acute headache relief. 2. Contraception strategy counseling: Change birth control pills

## 2011-06-30 ENCOUNTER — Ambulatory Visit (INDEPENDENT_AMBULATORY_CARE_PROVIDER_SITE_OTHER): Payer: BC Managed Care – PPO | Admitting: Family Medicine

## 2011-06-30 ENCOUNTER — Encounter: Payer: Self-pay | Admitting: Family Medicine

## 2011-06-30 DIAGNOSIS — D239 Other benign neoplasm of skin, unspecified: Secondary | ICD-10-CM

## 2011-06-30 DIAGNOSIS — K649 Unspecified hemorrhoids: Secondary | ICD-10-CM

## 2011-06-30 DIAGNOSIS — D229 Melanocytic nevi, unspecified: Secondary | ICD-10-CM

## 2011-06-30 NOTE — Assessment & Plan Note (Addendum)
Small (4mm) and uniform, I've given the pt the measurements and she'll monitor.  If it changes, she notify us and we can biopsy. It is small and uniform now, so I wouldn't biopsy.  She agrees to f/u if changed.

## 2011-06-30 NOTE — Patient Instructions (Addendum)
Keep an eye on the mole and recheck the size 2 months.  Candice Mcgrath it on the calendar.  It was 4mm across.  If changed or enlarged, let us know.   I would use the anusol as needed along with the prep H.  Try not to strain and take colace 100mg  a day (stool softener).

## 2011-06-30 NOTE — Progress Notes (Signed)
Rectal pain since last week.  It had happened earlier in the year.  Some temp relief with anusol for the pain, but the swelling isn't getting better per patient.  No FCNAVD.  No blood in stool.  H/o IBS with constipation.  Teaching at [employer].    Mole on abd wall.  Present for months, likely much longer.  She was concerned about it and wanted it checked.   Meds, vitals, and allergies reviewed.   ROS: See HPI.  Otherwise, noncontributory.  nad L abd wall uniform appearing brown macule, 4mm across. No color variation.  Border is uniform.  Chaperoned exam, nonthrombosed ext hemorrhoid at 6 o'clock.  No gross blood.

## 2011-06-30 NOTE — Assessment & Plan Note (Signed)
D/w pt about local tx and avoiding straining, constipation, etc.  Should improve with topical tx.  Already flaccid.  No indication for thrombectomy.  F/u prn.  She understood.

## 2011-07-04 ENCOUNTER — Telehealth: Payer: Self-pay | Admitting: *Deleted

## 2011-07-04 NOTE — Telephone Encounter (Signed)
Pt states she is not any better with her IBS, constipation and nausea and is asking what else she can do.  Until today she had not had a BM since Saturday or Sunday, and that was with an enema.  Last night she took stool softener, 2 cups of activia and ate fiber one bar and today she had a better BM.  What kind of daily regimen should she be on to prevent severe constipation?

## 2011-07-04 NOTE — Telephone Encounter (Signed)
Call  Challenging problem.  I think already on Amitiza, which often helps a lot.  If not helping, use fiber like doing. Make sure on something like Sennokot-S twice a day  If that does not help, daily or intermittent Miralax is very safe to add. This helps a lot of people and you can take none, a little, or a couple of doses a day depending on BM's

## 2011-07-04 NOTE — Telephone Encounter (Signed)
-   As below

## 2011-07-04 NOTE — Telephone Encounter (Signed)
Patient advised as instructed 

## 2011-07-28 ENCOUNTER — Encounter: Payer: Self-pay | Admitting: Family Medicine

## 2011-07-28 ENCOUNTER — Encounter: Payer: Self-pay | Admitting: *Deleted

## 2011-07-28 ENCOUNTER — Ambulatory Visit (INDEPENDENT_AMBULATORY_CARE_PROVIDER_SITE_OTHER): Payer: BC Managed Care – PPO | Admitting: Family Medicine

## 2011-07-28 DIAGNOSIS — F430 Acute stress reaction: Secondary | ICD-10-CM

## 2011-07-28 NOTE — Progress Notes (Signed)
  Subjective:    Patient ID: Candice Mcgrath, female    DOB: 30-Aug-1976, 35 y.o.   MRN: [id number]  HPI  35 year old female presents with sadness, frustration, anger intermittent in several months.  Has noted since OCP change in July.. She has been more emotional. (Not on OCPs for birth control, not sexually active, she was started by GYN for hormonal fluctuations, ? Heavy bleeding in past)  She has noted worse symptoms  before and after  menses. She doesn't feel bad all the time, just feels very stressed at school and work.  Feels fine when going to [employer] and at home.  Has history of alcoholism, marijuana, pill abuse.Koren Shiver  5 years. Has family history of mood issues.  Seeing counsleor and met with new minister this AM.  Has noted some increase in fatigue. No anhedonia. No guilt. Sleeping fairly well.. Does occ have early morning waking. No SI, no HI. Good motivation. Interacting with friends and family well.         Review of Systems  Constitutional: Negative for fever.  HENT: Negative for ear pain.   Eyes: Negative for pain.  Respiratory: Negative for shortness of breath.   Cardiovascular: Negative for chest pain and palpitations.       Objective:   Physical Exam  Constitutional: She appears well-developed and well-nourished.  Neck: Normal range of motion. Neck supple. No thyromegaly present.  Cardiovascular: Normal rate and regular rhythm.  Exam reveals no gallop and no friction rub.   No murmur heard. Pulmonary/Chest: Effort normal and breath sounds normal. She has no wheezes. She has no rales. She exhibits no tenderness.  Psychiatric: She has a normal mood and affect. Her behavior is normal. Judgment and thought content normal.          Assessment & Plan:

## 2011-07-28 NOTE — Assessment & Plan Note (Signed)
No definite diagnosis of depression. Pt agrees.  Work on stress reduction, counseling, exercsie.   Trial off OCPs. Folow up as needed.

## 2011-07-28 NOTE — Patient Instructions (Signed)
Counsling for stress reduction and relaxation techniques. Consider exercise and yoga for stress relief. Okay to stop Aviane. Restart if menstrual cycle irregular or no improvement in mood.

## 2011-08-10 LAB — DIFFERENTIAL
Basophils Absolute: 0.3 — ABNORMAL HIGH
Eosinophils Relative: 1
Lymphocytes Relative: 12
Lymphs Abs: 1.7
Monocytes Relative: 6
Neutrophils Relative %: 79 — ABNORMAL HIGH

## 2011-08-10 LAB — URINE CULTURE
Colony Count: NO GROWTH
Culture: NO GROWTH

## 2011-08-10 LAB — URINE MICROSCOPIC-ADD ON

## 2011-08-10 LAB — BASIC METABOLIC PANEL
BUN: 5 — ABNORMAL LOW
Calcium: 9.6
Creatinine, Ser: 0.81
GFR calc non Af Amer: >60
Glucose, Bld: 92

## 2011-08-10 LAB — CBC
HCT: 41.5
Platelets: 326
RDW: 12.5
WBC: 14.3 — ABNORMAL HIGH

## 2011-08-10 LAB — URINALYSIS, ROUTINE W REFLEX MICROSCOPIC
Bilirubin Urine: NEGATIVE
Glucose, UA: NEGATIVE
Ketones, ur: NEGATIVE
Nitrite: NEGATIVE
Specific Gravity, Urine: 1.02
pH: 7

## 2011-08-10 LAB — CULTURE, BLOOD (ROUTINE X 2): Culture: NO GROWTH

## 2011-08-10 LAB — PREGNANCY, URINE: Preg Test, Ur: NEGATIVE

## 2011-11-21 ENCOUNTER — Encounter: Payer: Self-pay | Admitting: Family Medicine

## 2011-11-21 ENCOUNTER — Encounter: Payer: Self-pay | Admitting: *Deleted

## 2011-11-21 ENCOUNTER — Ambulatory Visit (INDEPENDENT_AMBULATORY_CARE_PROVIDER_SITE_OTHER): Payer: BC Managed Care – PPO | Admitting: Family Medicine

## 2011-11-21 VITALS — BP 112/78 | HR 96 | Temp 98.5°F | Wt 170.8 lb

## 2011-11-21 DIAGNOSIS — J069 Acute upper respiratory infection, unspecified: Secondary | ICD-10-CM | POA: Insufficient documentation

## 2011-11-21 MED ORDER — FLUTICASONE PROPIONATE 50 MCG/ACT NA SUSP: 2.0000 | Freq: Every day | NASAL | Status: DC

## 2011-11-21 NOTE — Progress Notes (Signed)
  Subjective:    Patient ID: Candice Mcgrath, female    DOB: [birth year], 36 y.o.   MRN: [id number]  HPI CC: sinus congestion  H/o allergic rhinitis and exercise induced asthma.  Not on nasal steroids recently.  Last 2-3 days with nasal congestion, that progressed to RN.  Also with HA - described as pressure.  + subjective fever last night, did not check temp.  2 wks ago similar sxs then improved for 1 wk.  ST in am on and off for last month.  + tooth pain.  Some back ache.  So far has tried tylenol.  No abd pain, n/v, ear pain, rashes, myalgia, arthralgia.  Minimal cough.  Has not been getting allergy shots this fall because of several upper resp infections.  + sick contacts at school.  No smokers at home.    Review of Systems Per HPI    Objective:   Physical Exam  Nursing note and vitals reviewed. Constitutional: She appears well-developed and well-nourished. No distress.  HENT:  Head: Normocephalic and atraumatic.  Right Ear: Hearing, tympanic membrane, external ear and ear canal normal.  Left Ear: Hearing, tympanic membrane, external ear and ear canal normal.  Nose: No mucosal edema or rhinorrhea. Right sinus exhibits no maxillary sinus tenderness and no frontal sinus tenderness. Left sinus exhibits frontal sinus tenderness. Left sinus exhibits no maxillary sinus tenderness.  Mouth/Throat: Uvula is midline, oropharynx is clear and moist and mucous membranes are normal. No oropharyngeal exudate, posterior oropharyngeal edema, posterior oropharyngeal erythema or tonsillar abscesses.       Nasal turbinates pale  Eyes: Conjunctivae and EOM are normal. Pupils are equal, round, and reactive to light. No scleral icterus.  Neck: Normal range of motion. Neck supple.  Cardiovascular: Normal rate, regular rhythm, normal heart sounds and intact distal pulses.   No murmur heard. Pulmonary/Chest: Effort normal and breath sounds normal. No respiratory distress. She has no wheezes. She has no  rales.  Lymphadenopathy:    She has no cervical adenopathy.  Skin: Skin is warm and dry. No rash noted.       Assessment & Plan:

## 2011-11-21 NOTE — Assessment & Plan Note (Addendum)
Anticipate combination of worsening allergic rhinitis and viral URTI. Supportive care, start flonase. Update [place] if not improving as expected. If sxs going on past next week ,call us with update (?early sinusitis)

## 2011-11-21 NOTE — Patient Instructions (Signed)
I think this is combination of viral infection as well as worsening allergies. May benefit from restarting allergy shots. Take medicine as prescribed: flonase Push fluids and plenty of rest. Nasal saline irrigation or neti pot to help drain sinuses. May use simple mucinex with plenty of fluid to help mobilize mucous. Let us know if fever >101.5, trouble opening/closing mouth, difficulty swallowing, or worsening - you may need to be seen again.

## 2011-12-01 ENCOUNTER — Other Ambulatory Visit: Payer: Self-pay | Admitting: Family Medicine

## 2011-12-01 MED ORDER — ATOMOXETINE HCL 40 MG PO CAPS: 40.0000 mg | ORAL_CAPSULE | Freq: Two times a day (BID) | ORAL | Status: DC

## 2011-12-01 NOTE — Telephone Encounter (Signed)
PA approved til 11-30-2012

## 2011-12-01 NOTE — Telephone Encounter (Signed)
Patient needs refill today she is out of medication

## 2011-12-05 ENCOUNTER — Ambulatory Visit (INDEPENDENT_AMBULATORY_CARE_PROVIDER_SITE_OTHER): Payer: BC Managed Care – PPO | Admitting: Family Medicine

## 2011-12-05 VITALS — BP 128/79 | HR 74 | Temp 98.5°F | Resp 16 | Ht 61.5 in | Wt 169.8 lb

## 2011-12-05 DIAGNOSIS — R109 Unspecified abdominal pain: Secondary | ICD-10-CM

## 2011-12-05 DIAGNOSIS — L270 Generalized skin eruption due to drugs and medicaments taken internally: Secondary | ICD-10-CM

## 2011-12-05 DIAGNOSIS — A499 Bacterial infection, unspecified: Secondary | ICD-10-CM

## 2011-12-05 DIAGNOSIS — N898 Other specified noninflammatory disorders of vagina: Secondary | ICD-10-CM

## 2011-12-05 DIAGNOSIS — B9689 Other specified bacterial agents as the cause of diseases classified elsewhere: Secondary | ICD-10-CM

## 2011-12-05 DIAGNOSIS — Z113 Encounter for screening for infections with a predominantly sexual mode of transmission: Secondary | ICD-10-CM

## 2011-12-05 DIAGNOSIS — N72 Inflammatory disease of cervix uteri: Secondary | ICD-10-CM

## 2011-12-05 DIAGNOSIS — Z7251 High risk heterosexual behavior: Secondary | ICD-10-CM

## 2011-12-05 LAB — POCT UA - MICROSCOPIC ONLY
Casts, Ur, LPF, POC: NEGATIVE
Crystals, Ur, HPF, POC: NEGATIVE
Yeast, UA: NEGATIVE

## 2011-12-05 LAB — POCT URINALYSIS DIPSTICK
Bilirubin, UA: NEGATIVE
Glucose, UA: NEGATIVE
Ketones, UA: NEGATIVE
Leukocytes, UA: NEGATIVE
Nitrite, UA: NEGATIVE
Protein, UA: NEGATIVE
Spec Grav, UA: 1.02
Urobilinogen, UA: 0.2
pH, UA: 6

## 2011-12-05 LAB — POCT WET PREP WITH KOH
Clue Cells Wet Prep HPF POC: 100
KOH Prep POC: NEGATIVE
RBC Wet Prep HPF POC: NEGATIVE
Trichomonas, UA: NEGATIVE
Yeast Wet Prep HPF POC: NEGATIVE

## 2011-12-05 MED ORDER — METRONIDAZOLE 500 MG PO TABS: 500.0000 mg | ORAL_TABLET | Freq: Two times a day (BID) | ORAL | Status: AC

## 2011-12-05 NOTE — Progress Notes (Signed)
[facility]:  Office Visit  Chief Complaint:  Chief Complaint  Patient presents with  . Vaginal Discharge    x over 1 week  . Rash    on both sides of trunk x 2 weeks    HPI: Candice Mcgrath is a 36 y.o. female who complains of the following; 1. 1 week h/o vaginal discharge, yeast like. + h/o HSV 2, no dysuria, no hematuria.  2. Rash on bilateral flank. Has not tried anything for it, possible contact with bra causing itching. No new meds, foods, travels, insect bites, etc. "feels bumpy"   Past Medical History  Diagnosis Date  . ETOH abuse     sober 2007, [facility]  . 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 Headache   . IBS (irritable bowel syndrome)   . Allergic rhinitis   . Elevated BP   . Herpes   . Nephrolithiasis   . Hyperlipidemia    Past Surgical History  Procedure Date  . Basket surgery 2005, 06    kidney stones   History   Social History  . Marital Status: Single    Spouse Name: N/A    Number of Children: N/A  . Years of Education: N/A   Occupational History  . spanish     @ [employer]   Social History Main Topics  . Smoking status: Former Games developer  . Smokeless tobacco: Former Neurosurgeon    Quit date: 12/04/2008  . Alcohol Use: Not on file     recovering AA  . Drug Use: Not on file     Recovering NA  . Sexually Active: Yes    Family History  Problem Relation Age of Onset  . Alcohol abuse Other     GP  . Breast cancer Cousin   . Stroke Other     GGM   Allergies  Allergen Reactions  . Sulfamethoxazole W/Trimethoprim     REACTION: itching  . Sulfonamide Derivatives     REACTION: itching   Prior to Admission medications   Medication Sig Start Date End Date Taking? Authorizing Provider  albuterol (PROAIR HFA) 108 (90 BASE) MCG/ACT inhaler Inhale 2 puffs into the lungs as needed. Use sparingly    Yes Historical Provider, MD  atomoxetine (STRATTERA) 40 MG capsule Take 1 capsule (40 mg total) by mouth 2  (two) times daily. (contraindocation to stimulants) 12/01/11  Yes [name], MD  beclomethasone (QVAR) 80 MCG/ACT inhaler Inhale 2 puffs into the lungs 2 (two) times daily.     Yes Historical Provider, MD  fexofenadine (ALLEGRA) 180 MG tablet Take 180 mg by mouth daily.     Yes Historical Provider, MD  fluticasone (FLONASE) 50 MCG/ACT nasal spray Place 2 sprays into the nose daily. 11/21/11 11/20/12 Yes Eustaquio Boyden, MD  levonorgestrel-ethinyl estradiol (AVIANE) 0.1-20 MG-MCG per tablet Take 1 tablet by mouth daily. 04/19/11 04/18/12 Yes Spencer Copland, MD  lidocaine-prilocaine (EMLA) cream Apply topically. Four times a day to affected area, 1 month supply    Yes Historical Provider, MD  lubiprostone (AMITIZA) 8 MCG capsule Take 8 mcg by mouth 2 (two) times daily with a meal.     Yes Historical Provider, MD  valACYclovir (VALTREX) 1000 MG tablet Take 1,000 mg by mouth daily as needed.    Yes Historical Provider, MD     ROS: The patient denies fevers, chills, night sweats, unintentional weight loss, chest pain, palpitations, wheezing, dyspnea on exertion, nausea,  vomiting, abdominal pain, dysuria, hematuria, melena, numbness, weakness, or tingling. + vaginal dc  All other systems have been reviewed and were otherwise negative with the exception of those mentioned in the HPI and as above.    PHYSICAL EXAM: Filed Vitals:   12/05/11 1809  BP: 128/79  Pulse: 74  Temp: 98.5 F (36.9 C)  Resp: 16   Filed Vitals:   12/05/11 1809  Height: 5' 1.5" (1.562 m)  Weight: 169 lb 12.8 oz (77.021 kg)   Body mass index is 31.56 kg/(m^2).  General: Alert, no acute distress HEENT:  Normocephalic, atraumatic, oropharynx patent. Cardiovascular:  Regular rate and rhythm, no rubs murmurs or gallops.  No Carotid bruits, radial pulse intact. No pedal edema.  Respiratory: Clear to auscultation bilaterally.  No wheezes, rales, or rhonchi.  No cyanosis, no use of accessory musculature GI: No organomegaly,  abdomen is soft and non-tender, positive bowel sounds.  No masses. Skin: + < 1mm skin colored papular rash, no erythema. Only on left side  Neurologic: Facial musculature symmetric. Psychiatric: Patient is appropriate throughout our interaction. Lymphatic: No axillary or cervical lymphadenopathy Musculoskeletal: Gait intact. Genitourinary: no CMT, nl labia, nl  vagina wall, +white discharge, no masses, lesions. No pelvic pain  LABS: + Clue Cells on Wet Prep   ASSESSMENT/PLAN: Encounter Diagnoses  Name Primary?  . Vaginal Discharge Yes  . Screening for STD (sexually transmitted disease)   . Flank pain   . Bacterial vaginosis    1. Bacterial Vaginosis-Flagyl 2. Rash- most likely contact dermatitis from bra strap, recommend OTC hydrocortisone, if continues for more than 2 weeks or new sxs then f/u prn 3. STD screening-Pt has HSV 2, will only screen for HIV, RPR, G/C.     Rasean Joos PHUONG, DO 12/07/2011 4:39 PM

## 2011-12-06 LAB — HIV ANTIBODY (ROUTINE TESTING W REFLEX): HIV: NONREACTIVE

## 2011-12-06 LAB — RPR

## 2011-12-07 ENCOUNTER — Telehealth: Payer: Self-pay | Admitting: Family Medicine

## 2011-12-07 LAB — GC/CHLAMYDIA PROBE AMP, GENITAL
Chlamydia, DNA Probe: NEGATIVE
GC Probe Amp, Genital: NEGATIVE

## 2011-12-07 NOTE — Telephone Encounter (Signed)
Left message that her lab work was normal. Left generic lab message, did not specify which labs just said her labs were normal. IF she calls inquiring then  may let her know that her STD screening for Gonorrhea, chlamydia, HIV, syphilis were all negative.

## 2011-12-21 ENCOUNTER — Telehealth: Payer: Self-pay | Admitting: Family Medicine

## 2011-12-21 NOTE — Telephone Encounter (Signed)
Triage Record Num: [id number] Operator: Di Kindle Patient Name: Candice Mcgrath Call Date & Time: 12/21/2011 10:56:08AM Patient Phone: [phone] PCP: [name] Patient Gender: Female PCP Fax : [phone] Patient DOB: 30-Jul-1976 Practice Name: [facility] Reason for Call: Caller: [name]/Patient is calling with a question about ABX. States completed ABX for BV that she received from visit to [facility]: [facility] 12/05/11 for these symptoms; states symptoms are still there: cramping, abd pain, afebrile. Appt advised with continued symptoms, same made 12/22/11 @ 1430 with Dr Sharen Hones. Protocol(s) Used: Information Only Call; No Symptom Triage (Adult) Recommended Outcome per Protocol: Call Provider When Office is Open Reason for Outcome: Requesting regular office appointment Care Advice: ~ 12/21/2011 11:07:33AM Page 1 of 1 [email or web address]

## 2011-12-22 ENCOUNTER — Ambulatory Visit (INDEPENDENT_AMBULATORY_CARE_PROVIDER_SITE_OTHER): Payer: BC Managed Care – PPO | Admitting: Family Medicine

## 2011-12-22 ENCOUNTER — Encounter: Payer: Self-pay | Admitting: Family Medicine

## 2011-12-22 VITALS — BP 118/76 | HR 92 | Temp 97.7°F | Wt 170.5 lb

## 2011-12-22 DIAGNOSIS — N898 Other specified noninflammatory disorders of vagina: Secondary | ICD-10-CM | POA: Insufficient documentation

## 2011-12-22 DIAGNOSIS — A6000 Herpesviral infection of urogenital system, unspecified: Secondary | ICD-10-CM

## 2011-12-22 LAB — POCT WET PREP (WET MOUNT)

## 2011-12-22 MED ORDER — FLUCONAZOLE 150 MG PO TABS: 150.0000 mg | ORAL_TABLET | Freq: Once | ORAL | Status: AC

## 2011-12-22 MED ORDER — VALACYCLOVIR HCL 500 MG PO TABS: 500.0000 mg | ORAL_TABLET | Freq: Every day | ORAL | Status: DC

## 2011-12-22 NOTE — Assessment & Plan Note (Signed)
Wet prep negative for infection.   ? Physiologic discharge. Given story, will treat with diflucan. Update [place] if sxs not improving.

## 2011-12-22 NOTE — Progress Notes (Signed)
  Subjective:    Patient ID: Candice Mcgrath, female    DOB: 04-08-1976, 37 y.o.   MRN: [id number]  HPI CC: vag irritation  12/05/2011 - went to [facility], treated for BV with flagyl 500mg  bid x 7 days.  Never fully cleared.  No rash.  Decreased discharge but still present.  No odor.  Still itchy.  Had full STD testing, HIV, RPR.  Told otherwise normal.    No dysuria, frequency.  + urgency.  No hematuria.  Mild abd discomfort (h/o IBS), no fevers/chills, nausea.  Requests refill of valtrex, would like to take suppressive therapy.  1 outbreak in last year.  New sexual partner with no h/o HSV.  LMP 2 wks ago.  Periods have been regular.  currently sexually active, protection (condom and film use).  Review of Systems Per HPI    Objective:   Physical Exam  Nursing note and vitals reviewed. Constitutional: She appears well-developed and well-nourished. No distress.  HENT:  Mouth/Throat: Oropharynx is clear and moist. No oropharyngeal exudate.  Abdominal: Soft. Bowel sounds are normal. She exhibits no distension and no mass. There is no tenderness. There is no rebound and no guarding.  Genitourinary: Pelvic exam was performed with patient supine. There is no rash, tenderness, lesion or injury on the right labia. There is no rash, tenderness, lesion or injury on the left labia. No erythema or tenderness around the vagina. Vaginal discharge (slight white dishcarge) found.  Skin: Skin is warm and dry. No rash noted.  Psychiatric: She has a normal mood and affect.       Assessment & Plan:

## 2011-12-22 NOTE — Patient Instructions (Addendum)
Looks like vaginal yeast infection - treat with diflucan x1. Update [place] if not better after this. If wet prep shows something different, we will call you. Good to see you today, call us with questions. I've sent in valtrex refill.  supressive dosing

## 2011-12-22 NOTE — Assessment & Plan Note (Signed)
Refilled valtrex, suppressive therapy dosing.

## 2012-01-25 ENCOUNTER — Encounter: Payer: Self-pay | Admitting: Family Medicine

## 2012-01-25 ENCOUNTER — Ambulatory Visit (INDEPENDENT_AMBULATORY_CARE_PROVIDER_SITE_OTHER): Payer: BC Managed Care – PPO | Admitting: Family Medicine

## 2012-01-25 VITALS — BP 100/60 | HR 94 | Temp 97.9°F | Ht 61.0 in | Wt 171.8 lb

## 2012-01-25 DIAGNOSIS — L259 Unspecified contact dermatitis, unspecified cause: Secondary | ICD-10-CM

## 2012-01-25 DIAGNOSIS — L309 Dermatitis, unspecified: Secondary | ICD-10-CM

## 2012-01-25 DIAGNOSIS — D239 Other benign neoplasm of skin, unspecified: Secondary | ICD-10-CM

## 2012-01-25 MED ORDER — TRIAMCINOLONE ACETONIDE 0.1 % EX CREA: TOPICAL_CREAM | Freq: Two times a day (BID) | CUTANEOUS | Status: AC

## 2012-01-25 NOTE — Progress Notes (Signed)
  Patient Name: Candice Mcgrath Date of Birth: 11/02/1975 Age: 36 y.o. Medical Record Number: [id number] Gender: female Date of Encounter: 01/25/2012  History of Present Illness:  Candice Mcgrath is a 36 y.o. very pleasant female patient who presents with the following:  Left arm lesion:  The patient was  At a different doctor's office visit recently, and they recommended that she have a lesion evaluated in our office. 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 She had excisional biopsy in 2010.  She also has a few other lesions and she wanted to have evaluated and have a skin check.  Also has some fine scale on several places that has responded to hydrocortisone cream  Past Medical History, Surgical History, Social History, Family History, Problem List, Medications, and Allergies have been reviewed and updated if relevant.  Review of Systems: Above, no fever  Physical Examination: Filed Vitals:   01/25/12 1511  BP: 100/60  Pulse: 94  Temp: 97.9 F (36.6 C)  TempSrc: Oral  Height: 5\' 1"  (1.549 m)  Weight: 171 lb 12.8 oz (77.928 kg)  SpO2: 98%    Body mass index is 32.46 kg/(m^2).   GEN: WDWN, NAD, Non-toxic, Alert & Oriented x 3 HEENT: Atraumatic, Normocephalic.  Ears and Nose: No external deformity. EXTR: No clubbing/cyanosis/edema NEURO: Normal gait.  PSYCH: Normally interactive. Conversant. Not depressed or anxious appearing.  Calm demeanor.  SKIN: mildly raised hard area adjacent to the scar on her LEFT arm. She also has a elevated slightly reddish appearance area in her RIGHT leg Zila. There were also some scattered freckles and some healing very small pimples on her upper shoulder  Assessment and Plan:  1. Eczema   2. Dermatofibroma    Reassured about the mild eczematous changes. She can use some triamcinolone intermittently along with her  hydrocortisone.  LEFT arm, consistent with dermatofibroma, which is confirmed on prior biopsy. Reassured the patient, and I really don't see the need for an additional procedure with confirmed pathology  Orders Today: No orders of the defined types were placed in this encounter.    Medications Today: Meds ordered this encounter  Medications  . triamcinolone cream (KENALOG) 0.1 %    Sig: Apply topically 2 (two) times daily.    Dispense:  454 g    Refill:  0

## 2012-02-26 ENCOUNTER — Ambulatory Visit (INDEPENDENT_AMBULATORY_CARE_PROVIDER_SITE_OTHER): Payer: BC Managed Care – PPO | Admitting: Family Medicine

## 2012-02-26 ENCOUNTER — Encounter: Payer: Self-pay | Admitting: *Deleted

## 2012-02-26 ENCOUNTER — Encounter: Payer: Self-pay | Admitting: Family Medicine

## 2012-02-26 ENCOUNTER — Telehealth: Payer: Self-pay | Admitting: Family Medicine

## 2012-02-26 VITALS — BP 100/70 | HR 98 | Temp 97.5°F | Ht 61.0 in | Wt 168.1 lb

## 2012-02-26 DIAGNOSIS — J45909 Unspecified asthma, uncomplicated: Secondary | ICD-10-CM

## 2012-02-26 DIAGNOSIS — N760 Acute vaginitis: Secondary | ICD-10-CM

## 2012-02-26 DIAGNOSIS — J029 Acute pharyngitis, unspecified: Secondary | ICD-10-CM

## 2012-02-26 DIAGNOSIS — A499 Bacterial infection, unspecified: Secondary | ICD-10-CM

## 2012-02-26 LAB — POCT RAPID STREP A (OFFICE): Rapid Strep A Screen: NEGATIVE

## 2012-02-26 MED ORDER — METRONIDAZOLE 500 MG PO TABS: 500.0000 mg | ORAL_TABLET | Freq: Two times a day (BID) | ORAL | Status: DC

## 2012-02-26 MED ORDER — ALBUTEROL SULFATE HFA 108 (90 BASE) MCG/ACT IN AERS: 2.0000 | INHALATION_SPRAY | RESPIRATORY_TRACT | Status: DC | PRN

## 2012-02-26 NOTE — Telephone Encounter (Signed)
Caller: Cecilie/Patient; PCP: [name] T.; CB#: [phone]; Call regarding Sore Throat; Afebrile/subjective.  Popped pustule on back of throat 02/23/12. Emergent sx ruled out.  Home care for the interim and parameters for callback given.  Advised see in 24hours  per ST protocol.  Caller also relates that she has had BV recently and suspects recurrence of that.  Appt. x 2 with PCP at 10:15.

## 2012-02-26 NOTE — Progress Notes (Signed)
  Patient Name: Candice Mcgrath Date of Birth: 11-27-1975 Age: 36 y.o. Medical Record Number: [id number] Gender: female Date of Encounter: 02/26/2012  History of Present Illness:  Candice Mcgrath is a 36 y.o. very pleasant female patient who presents with the following:  Recent vaginal discharge and some odor. No pain. No recent sexual contacts. She has had the same partner for 6 months.  She also has a sore throat and a small pustule a few days ago, which she popped. No fever chills.  She does have some significant allergic rhinitis.  She also has some mild asthma, needs refill on her inhaler. Her asthma symptoms have flared up within the last 2-3 weeks.  Past Medical History, Surgical History, Social History, Family History, Problem List, Medications, and Allergies have been reviewed and updated if relevant.  Review of Systems:  GEN: No acute illnesses, no fevers, chills. GI: No n/v/d, eating normally Pulm: above Interactive and getting along well at home.  Otherwise, ROS is as per the HPI.   Physical Examination: Filed Vitals:   02/26/12 1010  BP: 100/70  Pulse: 98  Temp: 97.5 F (36.4 C)  TempSrc: Oral  Height: 5\' 1"  (1.549 m)  Weight: 168 lb 1.9 oz (76.259 kg)  SpO2: 96%    Body mass index is 31.77 kg/(m^2).   GEN: WDWN, NAD, Non-toxic, A & O x 3 HEENT: Atraumatic, Normocephalic. Neck supple. No masses, No LAD. Ears and Nose: No external deformity. CV: RRR, No M/G/R. No JVD. No thrill. No extra heart sounds. PULM: CTA B, no wheezes, crackles, rhonchi. No retractions. No resp. distress. No accessory muscle use. EXTR: No c/c/e NEURO Normal gait.  PSYCH: Normally interactive. Conversant. Not depressed or anxious appearing.  Calm demeanor.    Assessment and Plan:  1. Sore throat  POCT rapid strep A, metroNIDAZOLE (FLAGYL) 500 MG tablet  2. Bacterial vaginosis  metroNIDAZOLE (FLAGYL) 500 MG tablet  3. Asthma with allergic rhinitis     Wet prep shows clue cells.  No trichomoniasis. No yeast.  Sore throat likely from postnasal drip.  Refill the patient's Proair as well   Rapid Strep A Screen  Date Value Range Status  02/26/2012 Negative  Negative (no units) Final     Orders Today: Orders Placed This Encounter  Procedures  . POCT rapid strep A    Medications Today: Meds ordered this encounter  Medications  . metroNIDAZOLE (FLAGYL) 500 MG tablet    Sig: Take 1 tablet (500 mg total) by mouth 2 (two) times daily.    Dispense:  28 tablet    Refill:  0  . albuterol (PROAIR HFA) 108 (90 BASE) MCG/ACT inhaler    Sig: Inhale 2 puffs into the lungs every 4 (four) hours as needed. Use sparingly    Dispense:  1 Inhaler    Refill:  4

## 2012-03-01 ENCOUNTER — Telehealth: Payer: Self-pay | Admitting: Family Medicine

## 2012-03-01 MED ORDER — METRONIDAZOLE 0.75 % VA GEL: VAGINAL | Status: DC

## 2012-03-01 NOTE — Telephone Encounter (Signed)
MEDICATION SENT TO PHARMACY AND PATIENT ADVISED

## 2012-03-01 NOTE — Telephone Encounter (Signed)
Caller: Drenda/Patient is calling with a question about Metronidazole 500mg .The medication was written by Copland, Karleen Hampshire T.. Pt calling requesting another med be called in to treat BV. Pt started Metronidazole on 5/7 and is not tolerating it well. C/o extreme nausea and dizziness. Better now once she's eaten but would rather have med MD mentioned that was a 1 day pill-couldn't remember name. If possible please [facility] into [facility] [place]-did not have #.

## 2012-03-01 NOTE — Telephone Encounter (Signed)
Call  There is not a 1 day pill. You can take a much higher dose of metronidazole in 1 day, but the cure rates are lower.  Ok to change to metrogel vaginal if she would like 1 applicatorful PV qhs x5 days Disp 5 doses

## 2012-03-01 NOTE — Telephone Encounter (Signed)
Pt said med was not at either [facility] or [place]. I spoke with [place] at [facility] and gave verbal rx and [place] said would be ready in 15 mins. Pt notified while on phone.

## 2012-03-05 ENCOUNTER — Ambulatory Visit: Payer: Self-pay | Admitting: Family Medicine

## 2012-03-05 ENCOUNTER — Other Ambulatory Visit: Payer: Self-pay | Admitting: Family Medicine

## 2012-03-05 ENCOUNTER — Ambulatory Visit (INDEPENDENT_AMBULATORY_CARE_PROVIDER_SITE_OTHER): Payer: BC Managed Care – PPO | Admitting: Family Medicine

## 2012-03-05 ENCOUNTER — Encounter: Payer: Self-pay | Admitting: Family Medicine

## 2012-03-05 ENCOUNTER — Encounter: Payer: Self-pay | Admitting: *Deleted

## 2012-03-05 ENCOUNTER — Telehealth: Payer: Self-pay | Admitting: *Deleted

## 2012-03-05 VITALS — BP 120/78 | HR 85 | Temp 97.9°F | Ht 61.0 in | Wt 167.8 lb

## 2012-03-05 DIAGNOSIS — N949 Unspecified condition associated with female genital organs and menstrual cycle: Secondary | ICD-10-CM

## 2012-03-05 DIAGNOSIS — R102 Pelvic and perineal pain: Secondary | ICD-10-CM | POA: Insufficient documentation

## 2012-03-05 DIAGNOSIS — N898 Other specified noninflammatory disorders of vagina: Secondary | ICD-10-CM | POA: Insufficient documentation

## 2012-03-05 LAB — HCG, QUANTITATIVE, PREGNANCY: hCG, Beta Chain, Quant, S: 0.36 m[IU]/mL

## 2012-03-05 NOTE — Progress Notes (Signed)
  Subjective:    Patient ID: Candice Mcgrath, female    DOB: [birth year], 36 y.o.   MRN: [id number]  Vaginal Discharge The patient's primary symptoms include a vaginal discharge. The patient's pertinent negatives include no genital itching or vaginal bleeding. Primary symptoms comment: on metronidazole for BV, supposed to be starting menses 2 days ago on OCPs, this AM felt mild pain but then fsaw finger size brown mass come out This is a recurrent (had same thing happen in December) problem. The current episode started today (this AM). The problem occurs intermittently. The patient is experiencing no pain. She is not pregnant. Associated symptoms include frequency. Pertinent negatives include no back pain, chills, dysuria, fever, flank pain, nausea, sore throat or vomiting. Associated symptoms comments: Mild cramps, associates with menses.. The vaginal discharge was brown and thick. There has been no bleeding. Passing clots: she is not sure what this mass is.. ? a clot. She has been passing tissue. The symptoms are aggravated by nothing. She has tried nothing for the symptoms. She is sexually active. No, her partner does not have an STD. She uses condoms and oral contraceptives for contraception. Her menstrual history has been regular. Her past medical history is significant for herpes simplex. There is no history of an ectopic pregnancy or miscarriage.      Review of Systems  Constitutional: Negative for fever and chills.  HENT: Negative for sore throat.   Gastrointestinal: Negative for nausea and vomiting.  Genitourinary: Positive for frequency and vaginal discharge. Negative for dysuria and flank pain.  Musculoskeletal: Negative for back pain.       Objective:   Physical Exam  Constitutional: She appears well-developed and well-nourished.  HENT:  Head: Normocephalic and atraumatic.  Neck: Normal range of motion. Neck supple.  Cardiovascular: Normal rate, regular rhythm, normal heart sounds  and intact distal pulses.  Exam reveals no gallop and no friction rub.   No murmur heard. Pulmonary/Chest: Effort normal and breath sounds normal. She has no wheezes.  Abdominal: Soft. Bowel sounds are normal. She exhibits no distension and no mass. There is no tenderness. There is no rebound and no guarding.  Genitourinary: There is no rash, tenderness, lesion or injury on the right labia. There is no rash, tenderness, lesion or injury on the left labia. Uterus is tender. Uterus is not deviated, not enlarged and not fixed. Cervix exhibits no motion tenderness, no discharge and no friability. Right adnexum displays tenderness. Right adnexum displays no mass. Left adnexum displays no mass. There is bleeding around the vagina. Vaginal discharge found.       Tissue in vaginal canal  Skin: Skin is warm and dry.          Assessment & Plan:

## 2012-03-05 NOTE — Telephone Encounter (Signed)
Notify pt. Await pathology  And lab results.

## 2012-03-05 NOTE — Telephone Encounter (Signed)
Patient advised.

## 2012-03-05 NOTE — Progress Notes (Signed)
Addended byKerby Nora E on: 03/05/2012 09:00 AM   Modules accepted: Orders

## 2012-03-05 NOTE — Patient Instructions (Signed)
We will call you with results.  Stop at front desk to set up ultrasound.

## 2012-03-05 NOTE — Telephone Encounter (Signed)
Patient ultrasound normal.

## 2012-03-05 NOTE — Assessment & Plan Note (Signed)
Tissue brought appears concerning for fetal tissue or other tissue of pregnancy. Will send to pathology. Upreg was Will send for HCG Quat.  Pelvic Exam  Send for [place] to evaluate if any tissue remaining in uterus

## 2012-04-05 ENCOUNTER — Other Ambulatory Visit: Payer: Self-pay

## 2012-04-05 NOTE — Telephone Encounter (Signed)
Pt did not pick up Kenalog cream prescribed in 01/2012; [facility] did not [facility] rx. I spoke with shani at [facility] and is on hold since pt did not pick up. Pt [facility] to make sure [facility]; pt on way to [place].

## 2012-04-11 ENCOUNTER — Telehealth: Payer: Self-pay | Admitting: Family Medicine

## 2012-04-11 NOTE — Telephone Encounter (Signed)
Caller: Candice Mcgrath/Patient; PCP: [name] T.; CB#: [phone];  Call regarding Urinary Pain:  Reports urinary frequency and frequency.  Onset: 04/10/12.  Afebrile.  Currently driving home from [place].  BCP/ LMP 04/02/12. Denies flank pain.  Advised to see MD within 24 hrs for one  or more urinary tract symptoms and has not been previously evaluated per Urinary Symptoms Guideline.  Since appts remaining for 04/11/12, transferred to [facility] for appt  04/12/12.

## 2012-04-12 ENCOUNTER — Ambulatory Visit (INDEPENDENT_AMBULATORY_CARE_PROVIDER_SITE_OTHER): Payer: BC Managed Care – PPO | Admitting: Family Medicine

## 2012-04-12 ENCOUNTER — Encounter: Payer: Self-pay | Admitting: Family Medicine

## 2012-04-12 VITALS — BP 120/70 | HR 90 | Temp 97.9°F | Ht 61.0 in | Wt 166.2 lb

## 2012-04-12 DIAGNOSIS — R300 Dysuria: Secondary | ICD-10-CM

## 2012-04-12 DIAGNOSIS — N390 Urinary tract infection, site not specified: Secondary | ICD-10-CM | POA: Insufficient documentation

## 2012-04-12 LAB — POCT URINALYSIS DIPSTICK
Nitrite, UA: POSITIVE
Protein, UA: 100
Urobilinogen, UA: NEGATIVE

## 2012-04-12 MED ORDER — LUBIPROSTONE 8 MCG PO CAPS: 8.0000 ug | ORAL_CAPSULE | Freq: Two times a day (BID) | ORAL | Status: DC

## 2012-04-12 MED ORDER — CIPROFLOXACIN HCL 250 MG PO TABS: 250.0000 mg | ORAL_TABLET | Freq: Two times a day (BID) | ORAL | Status: AC

## 2012-04-12 NOTE — Assessment & Plan Note (Signed)
Uncomplicated lower tract infection. Treat with cipro x 3 days. Call if not improving as expected.

## 2012-04-12 NOTE — Patient Instructions (Addendum)
Push fluids. Start ciprofloxacin daily for 3 days.

## 2012-04-12 NOTE — Progress Notes (Signed)
  Subjective:    Patient ID: Candice Mcgrath, female    DOB: June 07, 1976, 36 y.o.   MRN: [id number]  Urinary Tract Infection  This is a new problem. The current episode started in the past 7 days. The problem occurs every urination. The problem has been gradually worsening. The patient is experiencing no pain. There has been no fever. She is sexually active. There is no history of pyelonephritis. Associated symptoms include frequency and urgency. Pertinent negatives include no chills, flank pain, hematuria, nausea or vomiting. She has tried nothing for the symptoms. Her past medical history is significant for kidney stones and recurrent UTIs. There is no history of catheterization or a urological procedure.      Review of Systems  Constitutional: Negative for chills.  Gastrointestinal: Negative for nausea and vomiting.  Genitourinary: Positive for urgency and frequency. Negative for hematuria and flank pain.       Objective:   Physical Exam  Constitutional: Vital signs are normal. She appears well-developed and well-nourished. She is cooperative.  Non-toxic appearance. She does not appear ill. No distress.  HENT:  Head: Normocephalic.  Right Ear: Hearing, tympanic membrane and ear canal normal. Tympanic membrane is not erythematous, not retracted and not bulging.  Left Ear: Hearing, tympanic membrane and ear canal normal. Tympanic membrane is not erythematous, not retracted and not bulging.  Nose: No mucosal edema or rhinorrhea. Right sinus exhibits no maxillary sinus tenderness and no frontal sinus tenderness. Left sinus exhibits no maxillary sinus tenderness and no frontal sinus tenderness.  Mouth/Throat: Uvula is midline and mucous membranes are normal.  Eyes: Lids are normal. No foreign bodies found.  Neck: Trachea normal. Carotid bruit is not present. No mass and no thyromegaly present.  Cardiovascular: Normal rate, regular rhythm, S1 normal, S2 normal, normal heart sounds, intact distal  pulses and normal pulses.  Exam reveals no gallop and no friction rub.   No murmur heard. Pulmonary/Chest: Effort normal and breath sounds normal. Not tachypneic. No respiratory distress. She has no decreased breath sounds. She has no wheezes. She has no rhonchi. She has no rales.  Abdominal: Soft. Normal appearance and bowel sounds are normal. There is no tenderness. There is no CVA tenderness.  Neurological: She is alert.  Skin: Skin is warm, dry and intact.  Psychiatric: Her speech is normal and behavior is normal. Judgment and thought content normal. Her mood appears not anxious. Cognition and memory are normal. She does not exhibit a depressed mood.          Assessment & Plan:

## 2012-05-03 ENCOUNTER — Other Ambulatory Visit: Payer: Self-pay | Admitting: Family Medicine

## 2012-05-03 NOTE — Telephone Encounter (Signed)
Left message on voice mail advising patient. 

## 2012-05-03 NOTE — Telephone Encounter (Signed)
Will refill with one refill.. Needs appt for CPX prior to further refills.

## 2012-05-10 ENCOUNTER — Other Ambulatory Visit: Payer: Self-pay | Admitting: Family Medicine

## 2012-05-31 ENCOUNTER — Other Ambulatory Visit: Payer: Self-pay | Admitting: Family Medicine

## 2012-05-31 DIAGNOSIS — Z13220 Encounter for screening for lipoid disorders: Secondary | ICD-10-CM

## 2012-05-31 DIAGNOSIS — R5381 Other malaise: Secondary | ICD-10-CM

## 2012-06-06 ENCOUNTER — Other Ambulatory Visit (INDEPENDENT_AMBULATORY_CARE_PROVIDER_SITE_OTHER): Payer: BC Managed Care – PPO

## 2012-06-06 DIAGNOSIS — R5383 Other fatigue: Secondary | ICD-10-CM

## 2012-06-06 DIAGNOSIS — Z13220 Encounter for screening for lipoid disorders: Secondary | ICD-10-CM

## 2012-06-06 DIAGNOSIS — R5381 Other malaise: Secondary | ICD-10-CM

## 2012-06-06 LAB — CBC WITH DIFFERENTIAL/PLATELET
Basophils Absolute: 0 10*3/uL (ref 0.0–0.1)
Eosinophils Absolute: 0.2 10*3/uL (ref 0.0–0.7)
HCT: 41 % (ref 36.0–46.0)
Hemoglobin: 13.8 g/dL (ref 12.0–15.0)
Lymphocytes Relative: 25 % (ref 12.0–46.0)
Lymphs Abs: 2 10*3/uL (ref 0.7–4.0)
MCHC: 33.7 g/dL (ref 30.0–36.0)
Monocytes Relative: 9.6 % (ref 3.0–12.0)
Neutro Abs: 5.1 10*3/uL (ref 1.4–7.7)
Platelets: 238 10*3/uL (ref 150.0–400.0)
RDW: 13.3 % (ref 11.5–14.6)

## 2012-06-06 LAB — BASIC METABOLIC PANEL
Calcium: 8.7 mg/dL (ref 8.4–10.5)
Creatinine, Ser: 0.8 mg/dL (ref 0.4–1.2)
Sodium: 140 mEq/L (ref 135–145)

## 2012-06-06 LAB — HEPATIC FUNCTION PANEL
AST: 18 U/L (ref 0–37)
Albumin: 3.8 g/dL (ref 3.5–5.2)
Alkaline Phosphatase: 59 U/L (ref 39–117)
Bilirubin, Direct: 0 mg/dL (ref 0.0–0.3)
Total Protein: 7.1 g/dL (ref 6.0–8.3)

## 2012-06-06 LAB — TSH: TSH: 2.35 u[IU]/mL (ref 0.35–5.50)

## 2012-06-06 LAB — LIPID PANEL
Cholesterol: 155 mg/dL (ref 0–200)
LDL Cholesterol: 91 mg/dL (ref 0–99)
Triglycerides: 54 mg/dL (ref 0.0–149.0)

## 2012-06-10 ENCOUNTER — Ambulatory Visit (INDEPENDENT_AMBULATORY_CARE_PROVIDER_SITE_OTHER): Payer: BC Managed Care – PPO | Admitting: Family Medicine

## 2012-06-10 ENCOUNTER — Encounter: Payer: Self-pay | Admitting: Family Medicine

## 2012-06-10 VITALS — BP 104/68 | HR 72 | Temp 97.3°F | Ht 61.0 in | Wt 166.8 lb

## 2012-06-10 DIAGNOSIS — R300 Dysuria: Secondary | ICD-10-CM

## 2012-06-10 DIAGNOSIS — F988 Other specified behavioral and emotional disorders with onset usually occurring in childhood and adolescence: Secondary | ICD-10-CM

## 2012-06-10 DIAGNOSIS — K589 Irritable bowel syndrome without diarrhea: Secondary | ICD-10-CM

## 2012-06-10 DIAGNOSIS — Z0000 Encounter for general adult medical examination without abnormal findings: Secondary | ICD-10-CM

## 2012-06-10 DIAGNOSIS — J45909 Unspecified asthma, uncomplicated: Secondary | ICD-10-CM

## 2012-06-10 LAB — POCT URINALYSIS DIPSTICK
Nitrite, UA: NEGATIVE
Urobilinogen, UA: 0.2
pH, UA: 5

## 2012-06-10 MED ORDER — CIPROFLOXACIN HCL 250 MG PO TABS: 250.0000 mg | ORAL_TABLET | Freq: Two times a day (BID) | ORAL | Status: AC

## 2012-06-10 MED ORDER — VALACYCLOVIR HCL 500 MG PO TABS: 500.0000 mg | ORAL_TABLET | Freq: Every day | ORAL | Status: DC

## 2012-06-10 MED ORDER — LUBIPROSTONE 8 MCG PO CAPS: 8.0000 ug | ORAL_CAPSULE | Freq: Two times a day (BID) | ORAL | Status: AC

## 2012-06-10 MED ORDER — ATOMOXETINE HCL 40 MG PO CAPS: 40.0000 mg | ORAL_CAPSULE | Freq: Two times a day (BID) | ORAL | Status: DC

## 2012-06-10 MED ORDER — HYOSCYAMINE SULFATE 0.125 MG SL SUBL: 0.1250 mg | SUBLINGUAL_TABLET | SUBLINGUAL | Status: DC | PRN

## 2012-06-10 MED ORDER — BECLOMETHASONE DIPROPIONATE 80 MCG/ACT IN AERS: 2.0000 | INHALATION_SPRAY | Freq: Two times a day (BID) | RESPIRATORY_TRACT | Status: DC

## 2012-06-10 MED ORDER — LEVONORGESTREL-ETHINYL ESTRAD 0.1-20 MG-MCG PO TABS: 1.0000 | ORAL_TABLET | Freq: Every day | ORAL | Status: DC

## 2012-06-10 NOTE — Progress Notes (Signed)
Nature conservation officer at [facility] [address] [place] [address] Phone: [phone] Fax: [phone]  Date:  06/10/2012   Name:  Candice Mcgrath   DOB:  Oct 12, 1976   MRN:  [id number] Gender: female  Age: 36 y.o.  PCP:  [name], MD    Chief Complaint: Annual Exam and Dysuria   History of Present Illness:  Candice Mcgrath is a 36 y.o. pleasant patient who presents with the following:  Sees [facility], did pap and breast   Health Maintenance Summary Reviewed and updated, unless pt declines services.  Tobacco History Reviewed. Non-smoker Alcohol: (recovery), still goes to AA 2-3 times a week Exercise Habits: Some activity, rec at least 30 mins 5 times a week STD concerns: none Drug Use: None (in recovery) Birth control method: OCP Menses regular: yes, did have some clot x 2 in the past year, sent to path with concern for miscarriage and came back normal tissue. Lumps or breast concerns: no  Health Maintenance  Topic Date Due  . Tetanus/tdap  09/29/1996  . Influenza Vaccine  07/23/2012  . Pap Smear  06/11/2015    Labs reviewed with the patient.  Lipids:    Component Value Date/Time   CHOL 155 06/06/2012 0859   TRIG 54.0 06/06/2012 0859   HDL 52.90 06/06/2012 0859   VLDL 10.8 06/06/2012 0859   CHOLHDL 3 06/06/2012 0859   CBC:    Component Value Date/Time   WBC 8.1 06/06/2012 0859   HGB 13.8 06/06/2012 0859   HCT 41.0 06/06/2012 0859   PLT 238.0 06/06/2012 0859   MCV 92.3 06/06/2012 0859   NEUTROABS 5.1 06/06/2012 0859   LYMPHSABS 2.0 06/06/2012 0859   MONOABS 0.8 06/06/2012 0859   EOSABS 0.2 06/06/2012 0859   BASOSABS 0.0 06/06/2012 0859    Comprehensive Metabolic Panel:    Component Value Date/Time   NA 140 06/06/2012 0859   K 4.2 06/06/2012 0859   CL 109 06/06/2012 0859   CO2 23 06/06/2012 0859   BUN 11 06/06/2012 0859   CREATININE 0.8 06/06/2012 0859   GLUCOSE 100* 06/06/2012 0859   CALCIUM 8.7 06/06/2012 0859   AST 18 06/06/2012 0859   ALT 14  06/06/2012 0859   ALKPHOS 59 06/06/2012 0859   BILITOT 0.1* 06/06/2012 0859   PROT 7.1 06/06/2012 0859   ALBUMIN 3.8 06/06/2012 0859       Results for orders placed in visit on 06/10/12  POCT URINALYSIS DIPSTICK      Component Value Range   Color, UA yellow     Clarity, UA cloudy     Glucose, UA negative     Bilirubin, UA negative     Ketones, UA negative     Spec Grav, UA 1.020     Blood, UA large     pH, UA 5.0     Protein, UA trace     Urobilinogen, UA 0.2     Nitrite, UA negative     Leukocytes, UA moderate (2+)     UTI: burning, dysuria and urgency and some suprapubic pain  Patient Active Problem List  Diagnosis  . GENITAL HERPES  . HYPERLIPIDEMIA  . ADD  . ALLERGIC RHINITIS  . ASTHMA  . IBS  . FATIGUE  . PERSONAL HISTORY OF ALCOHOLISM  . NEPHROLITHIASIS, HX OF  . SCIATICA  . Hemorrhoid  . Nevus  . UTI (lower urinary tract infection)    Past Medical History  Diagnosis Date  . ETOH abuse  sober 2007, [facility]  . 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 Headache   . IBS (irritable bowel syndrome)   . Allergic rhinitis   . Elevated BP   . Herpes   . Nephrolithiasis   . Hyperlipidemia     Past Surgical History  Procedure Date  . Basket surgery 2005, 06    kidney stones    History  Substance Use Topics  . Smoking status: Former Games developer  . Smokeless tobacco: Former Neurosurgeon    Quit date: 12/04/2008  . Alcohol Use: No     recovering AA    Family History  Problem Relation Age of Onset  . Alcohol abuse Other     GP  . Breast cancer Cousin   . Stroke Other     GGM    Allergies  Allergen Reactions  . Sulfamethoxazole W-Trimethoprim     REACTION: itching    Medication list has been reviewed and updated.  Current Outpatient Prescriptions on File Prior to Visit  Medication Sig Dispense Refill  . albuterol (PROAIR HFA) 108 (90 BASE) MCG/ACT inhaler Inhale 2 puffs into the lungs every 4 (four) hours as needed. Use sparingly  1  Inhaler  4  . fexofenadine (ALLEGRA) 180 MG tablet Take 180 mg by mouth daily.        Marland Kitchen lidocaine-prilocaine (EMLA) cream Apply topically. Four times a day to affected area, 1 month supply       . lubiprostone (AMITIZA) 8 MCG capsule Take 1 capsule (8 mcg total) by mouth 2 (two) times daily with a meal.  60 capsule  11  . ORSYTHIA 0.1-20 MG-MCG tablet TAKE 1 TABLET BY MOUTH DAILY.  28 tablet  1  . STRATTERA 40 MG capsule TAKE ONE CAPSULE BY MOUTH TWICE A DAY  60 capsule  3  . triamcinolone cream (KENALOG) 0.1 % Apply topically 2 (two) times daily.  454 g  0  . valACYclovir (VALTREX) 500 MG tablet Take 1 tablet (500 mg total) by mouth daily.  30 tablet  6    Review of Systems:   General: Denies fever, chills, sweats. No significant weight loss. Eyes: Denies blurring,significant itching ENT: Denies earache, sore throat, and hoarseness.  Cardiovascular: Denies chest pains, palpitations, dyspnea on exertion,  Respiratory: Denies cough, dyspnea at rest,wheeezing Breast: no concerns about lumps GI: significant increase in cramping, constipation and IBS symptoms today GU:as above Musculoskeletal: Denies back pain, joint pain Derm: Denies rash, itching Neuro: Denies  paresthesias, frequent falls, frequent headaches Psych: Denies depression, anxiety Endocrine: Denies cold intolerance, heat intolerance, polydipsia Heme: Denies enlarged lymph nodes Allergy: No hayfever   Physical Examination: Filed Vitals:   06/10/12 1443  BP: 104/68  Pulse: 72  Temp: 97.3 F (36.3 C)   Filed Vitals:   06/10/12 1443  Height: 5\' 1"  (1.549 m)  Weight: 166 lb 12 oz (75.637 kg)   Body mass index is 31.51 kg/(m^2). Ideal Body Weight: Weight in (lb) to have BMI = 25: 132    GEN: well developed, well nourished, no acute distress Eyes: conjunctiva and lids normal, PERRLA, EOMI ENT: TM clear, nares clear, oral exam WNL Neck: supple, no lymphadenopathy, no thyromegaly, no JVD Pulm: clear to auscultation  and percussion, respiratory effort normal CV: regular rate and rhythm, S1-S2, no murmur, rub or gallop, no bruits Chest: no scars, masses, no lumps BREAST: breast exam declined GI: soft, mild suprapubic and diffuse tenderness; no hepatosplenomegaly, masses; active bowel sounds all  quadrants GU: GU exam declined Lymph: no cervical, axillary or inguinal adenopathy MSK: gait normal, muscle tone and strength WNL, no joint swelling, effusions, discoloration, crepitus  SKIN: clear, good turgor, color WNL, no rashes, lesions, or ulcerations Neuro: normal mental status, normal strength, sensation, and motion Psych: alert; oriented to person, place and time, normally interactive and not anxious or depressed in appearance.    Assessment and Plan:  1. Routine general medical examination at a health care facility    2. Dysuria  POCT urinalysis dipstick, Urine culture  3. IBS    4. ADD    5. ASTHMA     The patient's preventative maintenance and recommended screening tests for an annual wellness exam were reviewed in full today. Brought up to date unless services declined.  Counselled on the importance of diet, exercise, and its role in overall health and mortality. The patient's FH and SH was reviewed, including their home life, tobacco status, and drug and alcohol status.   Doing well generally  Acute UTI: ABX  IBS flare: cont current meds, and add Levsin prn cramping  Refilled all meds  Orders Today:  Orders Placed This Encounter  Procedures  . Urine culture  . POCT urinalysis dipstick    Medications Today: (Includes new updates added during medication reconciliation) Meds ordered this encounter  Medications  . ciprofloxacin (CIPRO) 250 MG tablet    Sig: Take 1 tablet (250 mg total) by mouth 2 (two) times daily.    Dispense:  14 tablet    Refill:  0  . hyoscyamine (LEVSIN SL) 0.125 MG SL tablet    Sig: Place 1 tablet (0.125 mg total) under the tongue every 4 (four) hours as  needed for cramping.    Dispense:  40 tablet    Refill:  4  . lubiprostone (AMITIZA) 8 MCG capsule    Sig: Take 1 capsule (8 mcg total) by mouth 2 (two) times daily with a meal.    Dispense:  60 capsule    Refill:  11  . levonorgestrel-ethinyl estradiol (ORSYTHIA) 0.1-20 MG-MCG tablet    Sig: Take 1 tablet by mouth daily.    Dispense:  28 tablet    Refill:  11  . atomoxetine (STRATTERA) 40 MG capsule    Sig: Take 1 capsule (40 mg total) by mouth 2 (two) times daily.    Dispense:  60 capsule    Refill:  5  . valACYclovir (VALTREX) 500 MG tablet    Sig: Take 1 tablet (500 mg total) by mouth daily.    Dispense:  30 tablet    Refill:  6  . beclomethasone (QVAR) 80 MCG/ACT inhaler    Sig: Inhale 2 puffs into the lungs 2 (two) times daily.    Dispense:  1 Inhaler    Refill:  11    Medications Discontinued: Medications Discontinued During This Encounter  Medication Reason  . metroNIDAZOLE (METROGEL VAGINAL) 0.75 % vaginal gel Completed Course  . lubiprostone (AMITIZA) 8 MCG capsule Reorder  . ORSYTHIA 0.1-20 MG-MCG tablet Reorder  . STRATTERA 40 MG capsule Reorder  . valACYclovir (VALTREX) 500 MG tablet Reorder     [name], MD

## 2012-06-12 LAB — URINE CULTURE: Colony Count: 2000

## 2012-07-25 ENCOUNTER — Telehealth: Payer: Self-pay | Admitting: Family Medicine

## 2012-07-25 ENCOUNTER — Ambulatory Visit (INDEPENDENT_AMBULATORY_CARE_PROVIDER_SITE_OTHER): Payer: BC Managed Care – PPO | Admitting: Family Medicine

## 2012-07-25 ENCOUNTER — Encounter: Payer: Self-pay | Admitting: Family Medicine

## 2012-07-25 VITALS — BP 108/68 | HR 84 | Temp 98.3°F | Wt 172.0 lb

## 2012-07-25 DIAGNOSIS — R109 Unspecified abdominal pain: Secondary | ICD-10-CM

## 2012-07-25 DIAGNOSIS — R52 Pain, unspecified: Secondary | ICD-10-CM

## 2012-07-25 DIAGNOSIS — M533 Sacrococcygeal disorders, not elsewhere classified: Secondary | ICD-10-CM

## 2012-07-25 LAB — POCT URINALYSIS DIPSTICK
Bilirubin, UA: NEGATIVE
Blood, UA: NEGATIVE
Glucose, UA: NEGATIVE
Ketones, UA: NEGATIVE
Spec Grav, UA: 1.02
pH, UA: 6.5

## 2012-07-25 NOTE — Telephone Encounter (Signed)
Caller: Aprel/Patient;  PCP: [name] (Family Practice); Best Callback Phone Number: [phone].Patient reports onset 07/22/12 of right kidney pain, afebrile. Guideline: Flank Pain, constant. Disposition: See in 4 hours due to History of kidney stones, similar symptoms, appointment 1030 07/25/12 with Dr Patsy Lager.

## 2012-07-25 NOTE — Progress Notes (Signed)
Nature conservation officer at [facility] [address] [place] [address] Phone: [phone] Fax: [phone]  Date:  07/25/2012   Name:  Candice Mcgrath   DOB:  [birth year]   MRN:  [id number] Gender: female Age: 36 y.o.  PCP:  [name], MD    Chief Complaint: Abdominal Pain   History of Present Illness:  Candice Mcgrath is a 36 y.o. pleasant patient who presents with the following:  For three days, has had pain in her right side. Feels like prior kidney stones. Took 4 advil this morning and still hurts.   SI joint: On further exam and discussion, the patient actually localizes her pain to the right SI joint. She has not had any numbness, tingling, radiculopathy down her leg. He is having some pain in the lower lumbar spine region, but not in the costovertebral angle.  Patient Active Problem List  Diagnosis  . GENITAL HERPES  . HYPERLIPIDEMIA  . ADD  . ALLERGIC RHINITIS  . ASTHMA  . IBS  . FATIGUE  . PERSONAL HISTORY OF ALCOHOLISM  . NEPHROLITHIASIS, HX OF  . SCIATICA  . Hemorrhoid    Past Medical History  Diagnosis Date  . ETOH abuse     sober 2007, [facility]  . 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 Headache   . IBS (irritable bowel syndrome)   . Allergic rhinitis   . Elevated BP   . Herpes   . Nephrolithiasis   . Hyperlipidemia     Past Surgical History  Procedure Date  . Basket surgery 2005, 06    kidney stones    History  Substance Use Topics  . Smoking status: Former Games developer  . Smokeless tobacco: Former Neurosurgeon    Quit date: 12/04/2008  . Alcohol Use: No     recovering AA    Family History  Problem Relation Age of Onset  . Alcohol abuse Other     GP  . Breast cancer Cousin   . Stroke Other     GGM    Allergies  Allergen Reactions  . Sulfamethoxazole W-Trimethoprim     REACTION: itching    Medication list has been reviewed and updated.  Outpatient Prescriptions Prior to Visit  Medication Sig Dispense Refill    . albuterol (PROAIR HFA) 108 (90 BASE) MCG/ACT inhaler Inhale 2 puffs into the lungs every 4 (four) hours as needed. Use sparingly  1 Inhaler  4  . atomoxetine (STRATTERA) 40 MG capsule Take 1 capsule (40 mg total) by mouth 2 (two) times daily.  60 capsule  5  . beclomethasone (QVAR) 80 MCG/ACT inhaler Inhale 2 puffs into the lungs 2 (two) times daily.  1 Inhaler  11  . fexofenadine (ALLEGRA) 180 MG tablet Take 180 mg by mouth daily.        Marland Kitchen levonorgestrel-ethinyl estradiol (ORSYTHIA) 0.1-20 MG-MCG tablet Take 1 tablet by mouth daily.  28 tablet  11  . lidocaine-prilocaine (EMLA) cream Apply topically. Four times a day to affected area, 1 month supply       . triamcinolone cream (KENALOG) 0.1 % Apply topically 2 (two) times daily.  454 g  0  . valACYclovir (VALTREX) 500 MG tablet Take 1 tablet (500 mg total) by mouth daily.  30 tablet  6  . hyoscyamine (LEVSIN SL) 0.125 MG SL tablet Place 1 tablet (0.125 mg total) under the tongue every 4 (four) hours as needed for cramping.  40 tablet  4    Review of Systems:   GEN: No fevers, chills. Nontoxic. Primarily MSK c/o today. MSK: Detailed in the HPI GI: tolerating PO intake without difficulty Neuro: No numbness, parasthesias, or tingling associated. Otherwise the pertinent positives of the ROS are noted above.    Physical Examination: Filed Vitals:   07/25/12 1042  BP: 108/68  Pulse: 84  Temp: 98.3 F (36.8 C)   Filed Vitals:   07/25/12 1042  Weight: 172 lb (78.019 kg)   There is no height on file to calculate BMI. Ideal Body Weight:     GEN: WDWN, NAD, Non-toxic, Alert & Oriented x 3 HEENT: Atraumatic, Normocephalic.  Ears and Nose: No external deformity. EXTR: No clubbing/cyanosis/edema NEURO: Normal gait.  PSYCH: Normally interactive. Conversant. Not depressed or anxious appearing.  Calm demeanor.   Tender to palpation mildly in the erector spinae complex bilaterally, but there is some focal tenderness in the SI joint on  the right. Nontender the trochanteric bursa. Full range of motion at the waist.  Assessment and Plan:  1. SI (sacroiliac) joint dysfunction    2. Abdominal pain, acute  POCT urinalysis dipstick   Urine is clear. Consistent with SI joint dysfunction. Reviewed sacroiliitis care.  Orders Today:  Orders Placed This Encounter  Procedures  . POCT urinalysis dipstick   Results for orders placed in visit on 07/25/12  POCT URINALYSIS DIPSTICK      Component Value Range   Color, UA yellow     Clarity, UA cloudy     Glucose, UA neg     Bilirubin, UA neg     Ketones, UA neg     Spec Grav, UA 1.020     Blood, UA neg     pH, UA 6.5     Protein, UA trace     Urobilinogen, UA negative     Nitrite, UA neg     Leukocytes, UA Negative       Updated Medication List: (Includes new medications, updates to list, dose adjustments) Meds ordered this encounter  Medications  . hyoscyamine (LEVSIN SL) 0.125 MG SL tablet    Sig: Place 0.125 mg under the tongue every 4 (four) hours as needed.    Medications Discontinued: Medications Discontinued During This Encounter  Medication Reason  . hyoscyamine (LEVSIN SL) 0.125 MG SL tablet      [name], MD

## 2012-07-31 ENCOUNTER — Ambulatory Visit (INDEPENDENT_AMBULATORY_CARE_PROVIDER_SITE_OTHER): Payer: BC Managed Care – PPO | Admitting: Family Medicine

## 2012-07-31 ENCOUNTER — Encounter: Payer: Self-pay | Admitting: Family Medicine

## 2012-07-31 VITALS — BP 104/70 | HR 72 | Temp 98.2°F | Wt 172.5 lb

## 2012-07-31 DIAGNOSIS — B373 Candidiasis of vulva and vagina: Secondary | ICD-10-CM

## 2012-07-31 MED ORDER — FLUCONAZOLE 150 MG PO TABS: ORAL_TABLET | ORAL | Status: DC

## 2012-07-31 NOTE — Progress Notes (Signed)
Nature conservation officer at [facility] [address] [place] [address] Phone: [phone] Fax: [phone]  Date:  07/31/2012   Name:  Candice Mcgrath   DOB:  07-05-1976   MRN:  [id number] Gender: female Age: 36 y.o.  PCP:  [name], MD    Chief Complaint: Vaginitis   History of Present Illness:  Candice Mcgrath is a 36 y.o. pleasant patient who presents with the following:  Really itchy and discharge.  IBS flare, too.   Yeast infection.  The patient currently has a herpes flare, but she also has a irritating vaginal discharge it is somewhat itchy with the vagina. She does have a new sexual partner, but they have been using condoms each time, and she has no reason to suspect any potential STD exposure to her knowledge, and has had discussions regarding this with him.  Patient Active Problem List  Diagnosis  . GENITAL HERPES  . HYPERLIPIDEMIA  . ADD  . ALLERGIC RHINITIS  . ASTHMA  . IBS  . FATIGUE  . PERSONAL HISTORY OF ALCOHOLISM  . NEPHROLITHIASIS, HX OF  . SCIATICA  . Hemorrhoid    Past Medical History  Diagnosis Date  . ETOH abuse     sober 2007, [facility]  . 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 Headache   . IBS (irritable bowel syndrome)   . Allergic rhinitis   . Elevated BP   . Herpes   . Nephrolithiasis   . Hyperlipidemia     Past Surgical History  Procedure Date  . Basket surgery 2005, 06    kidney stones    History  Substance Use Topics  . Smoking status: Former Games developer  . Smokeless tobacco: Former Neurosurgeon    Quit date: 12/04/2008  . Alcohol Use: No     recovering AA    Family History  Problem Relation Age of Onset  . Alcohol abuse Other     GP  . Breast cancer Cousin   . Stroke Other     GGM    Allergies  Allergen Reactions  . Sulfamethoxazole W-Trimethoprim     REACTION: itching    Medication list has been reviewed and updated.  Outpatient Prescriptions Prior to Visit  Medication Sig Dispense  Refill  . albuterol (PROAIR HFA) 108 (90 BASE) MCG/ACT inhaler Inhale 2 puffs into the lungs every 4 (four) hours as needed. Use sparingly  1 Inhaler  4  . atomoxetine (STRATTERA) 40 MG capsule Take 1 capsule (40 mg total) by mouth 2 (two) times daily.  60 capsule  5  . beclomethasone (QVAR) 80 MCG/ACT inhaler Inhale 2 puffs into the lungs 2 (two) times daily.  1 Inhaler  11  . fexofenadine (ALLEGRA) 180 MG tablet Take 180 mg by mouth daily.        . hyoscyamine (LEVSIN SL) 0.125 MG SL tablet Place 0.125 mg under the tongue every 4 (four) hours as needed.      Marland Kitchen levonorgestrel-ethinyl estradiol (ORSYTHIA) 0.1-20 MG-MCG tablet Take 1 tablet by mouth daily.  28 tablet  11  . lidocaine-prilocaine (EMLA) cream Apply topically. Four times a day to affected area, 1 month supply       . triamcinolone cream (KENALOG) 0.1 % Apply topically 2 (two) times daily.  454 g  0  . valACYclovir (VALTREX) 500 MG tablet Take 1 tablet (500 mg total) by mouth daily.  30 tablet  6    Review of  Systems:  As above. Some diarrhea. Herpes outbreak.  Physical Examination: Filed Vitals:   07/31/12 1222  BP: 104/70  Pulse: 72  Temp: 98.2 F (36.8 C)   Filed Vitals:   07/31/12 1222  Weight: 172 lb 8 oz (78.245 kg)   There is no height on file to calculate BMI. Ideal Body Weight:     GEN: WDWN, NAD, Non-toxic, Alert & Oriented x 3 HEENT: Atraumatic, Normocephalic.  Ears and Nose: No external deformity. GU: Examination chaperoned. Normal at external introitus. Swab taken. EXTR: No clubbing/cyanosis/edema NEURO: Normal gait.  PSYCH: Normally interactive. Conversant. Not depressed or anxious appearing.  Calm demeanor.    Assessment and Plan:  1. Yeast vaginitis    Mild amount of yeast on wet prep. No trichomonas. No significant clue cells.  Orders Today:  No orders of the defined types were placed in this encounter.    Updated Medication List: (Includes new medications, updates to list, dose  adjustments) Meds ordered this encounter  Medications  . fluconazole (DIFLUCAN) 150 MG tablet    Sig: Use as directed. Take 1, repeat in 1 week if symptomatic    Dispense:  2 tablet    Refill:  0    Medications Discontinued: There are no discontinued medications.   [name], MD

## 2012-08-15 ENCOUNTER — Encounter: Payer: Self-pay | Admitting: Family Medicine

## 2012-08-15 ENCOUNTER — Encounter: Payer: Self-pay | Admitting: *Deleted

## 2012-08-15 ENCOUNTER — Ambulatory Visit (INDEPENDENT_AMBULATORY_CARE_PROVIDER_SITE_OTHER): Payer: BC Managed Care – PPO | Admitting: Family Medicine

## 2012-08-15 ENCOUNTER — Telehealth: Payer: Self-pay | Admitting: Family Medicine

## 2012-08-15 VITALS — BP 120/70 | HR 93 | Temp 98.3°F | Wt 173.0 lb

## 2012-08-15 DIAGNOSIS — J309 Allergic rhinitis, unspecified: Secondary | ICD-10-CM

## 2012-08-15 DIAGNOSIS — J069 Acute upper respiratory infection, unspecified: Secondary | ICD-10-CM

## 2012-08-15 DIAGNOSIS — J45909 Unspecified asthma, uncomplicated: Secondary | ICD-10-CM

## 2012-08-15 MED ORDER — FLUCONAZOLE 150 MG PO TABS: ORAL_TABLET | ORAL | Status: DC

## 2012-08-15 MED ORDER — DM-GUAIFENESIN ER 60-1200 MG PO TB12: 1.0000 | ORAL_TABLET | Freq: Two times a day (BID) | ORAL | Status: DC

## 2012-08-15 NOTE — Telephone Encounter (Signed)
Caller: Ruth/Patient; Patient Name: Candice Mcgrath; PCP: [name] [facility]); Best Callback Phone Number: [phone].  Pt. complains of sinus congestion, sore throat, and cough.  Onset Monday 08/12/12.  Afebrile.  Pt. has hx. of yearly sinus infections when the radiant heat comes on.  Triaged per Upper Respiratory Symptoms-Adult, and emergent sx. r/o per guidlines.  Disposition:  Upgraded pe Pt.s request:  See Provider within 72 hours for:  New onset of nsal congestion with runny nose; sneezing; itchy or mild sore throat, mild heache or body aches, mild fatigue.  Appointment made for today, 08/15/12 at 11:30 with Dr. Ermalene Searing.  Home care instrucitons given.  CAN/db.

## 2012-08-15 NOTE — Patient Instructions (Addendum)
Stay on allegra and Qvar daily. Add nasal fluticasone 2 sprays per nostril daily. Nasal saline irrigation spray. Mucinex or mucinex DM

## 2012-08-15 NOTE — Progress Notes (Signed)
  Subjective:    Patient ID: Candice Mcgrath, female    DOB: 1976-06-07, 36 y.o.   MRN: [id number]  HPI Comments: Usually has allergy issues this time of year.  URI  This is a new problem. The current episode started in the past 7 days. The problem has been gradually worsening. There has been no fever. Associated symptoms include coughing, headaches, rhinorrhea, sinus pain, sneezing and a sore throat. Pertinent negatives include no rash, swollen glands or wheezing. Associated symptoms comments: Some shortness of breath. She has tried antihistamine (allegra off and on) for the symptoms. The treatment provided mild relief.    Hx of asthma.. Using Qvar. Not needing albuterol at all. Has not started nasal steroid back yet.  From last OV still with vaginal discharge and odor. rRequests refill of fluconazole. Reviewed last OV note.  Review of Systems  HENT: Positive for sore throat, rhinorrhea and sneezing.   Respiratory: Positive for cough. Negative for wheezing.   Skin: Negative for rash.  Neurological: Positive for headaches.       Objective:   Physical Exam  Constitutional: Vital signs are normal. She appears well-developed and well-nourished. She is cooperative.  Non-toxic appearance. She does not appear ill. No distress.  HENT:  Head: Normocephalic.  Right Ear: Hearing, tympanic membrane, external ear and ear canal normal. Tympanic membrane is not erythematous, not retracted and not bulging.  Left Ear: Hearing, tympanic membrane, external ear and ear canal normal. Tympanic membrane is not erythematous, not retracted and not bulging.  Nose: Mucosal edema and rhinorrhea present. Right sinus exhibits no maxillary sinus tenderness and no frontal sinus tenderness. Left sinus exhibits no maxillary sinus tenderness and no frontal sinus tenderness.  Mouth/Throat: Uvula is midline, oropharynx is clear and moist and mucous membranes are normal.  Eyes: Conjunctivae normal, EOM and lids are normal.  Pupils are equal, round, and reactive to light. No foreign bodies found.  Neck: Trachea normal and normal range of motion. Neck supple. Carotid bruit is not present. No mass and no thyromegaly present.  Cardiovascular: Normal rate, regular rhythm, S1 normal, S2 normal, normal heart sounds, intact distal pulses and normal pulses.  Exam reveals no gallop and no friction rub.   No murmur heard. Pulmonary/Chest: Effort normal and breath sounds normal. Not tachypneic. No respiratory distress. She has no decreased breath sounds. She has no wheezes. She has no rhonchi. She has no rales.  Neurological: She is alert.  Skin: Skin is warm, dry and intact. No rash noted.  Psychiatric: Her speech is normal and behavior is normal. Judgment normal. Her mood appears not anxious. Cognition and memory are normal. She does not exhibit a depressed mood.          Assessment & Plan:

## 2012-09-01 DIAGNOSIS — J069 Acute upper respiratory infection, unspecified: Secondary | ICD-10-CM | POA: Insufficient documentation

## 2012-09-01 NOTE — Assessment & Plan Note (Signed)
Symptomatic care 

## 2012-09-01 NOTE — Assessment & Plan Note (Signed)
No cleaer exacerbation. Stay on allegra and Qvar daily.

## 2012-09-01 NOTE — Assessment & Plan Note (Signed)
Add nasal fluticasone 2 sprays per nostril daily. Nasal saline irrigation spray. Mucinex or mucinex DM

## 2012-09-04 ENCOUNTER — Telehealth: Payer: Self-pay

## 2012-09-04 NOTE — Telephone Encounter (Signed)
Pt has whelps on throat for 2 days and today noticed on side of tongue; throat is sore but not trouble swallowing,eating or drinking. No difficulty breathing, no rash or fever. Pt scheduled appt Dr Patsy Lager 09/05/12 at 9:30 am. Advised pt if condition changed or worsened to go to UC.pt voiced understanding.

## 2012-09-05 ENCOUNTER — Encounter: Payer: Self-pay | Admitting: Family Medicine

## 2012-09-05 ENCOUNTER — Encounter: Payer: Self-pay | Admitting: *Deleted

## 2012-09-05 ENCOUNTER — Ambulatory Visit (INDEPENDENT_AMBULATORY_CARE_PROVIDER_SITE_OTHER): Payer: BC Managed Care – PPO | Admitting: Family Medicine

## 2012-09-05 VITALS — BP 120/78 | HR 95 | Temp 98.2°F | Ht 61.0 in | Wt 172.8 lb

## 2012-09-05 DIAGNOSIS — J029 Acute pharyngitis, unspecified: Secondary | ICD-10-CM

## 2012-09-05 NOTE — Progress Notes (Signed)
Nature conservation officer at [facility] [address] [place] [address] Phone: [phone] Fax: [phone]  Date:  09/05/2012   Name:  Candice Mcgrath   DOB:  [birth year]   MRN:  [id number] Gender: female Age: 36 y.o.  PCP:  [name], MD  Evaluating MD: [name], MD   Chief Complaint: whelps   History of Present Illness:  Candice Mcgrath is a 36 y.o. pleasant patient who presents with the following:  Pustule and sore throat. A few pustules in the mouth, wanted to check and make sure ok. No fever, chills or sweats  Patient Active Problem List  Diagnosis  . GENITAL HERPES  . HYPERLIPIDEMIA  . ADD  . ALLERGIC RHINITIS  . ASTHMA  . IBS  . FATIGUE  . PERSONAL HISTORY OF ALCOHOLISM  . NEPHROLITHIASIS, HX OF  . SCIATICA  . Hemorrhoid  . Viral URI    Past Medical History  Diagnosis Date  . ETOH abuse     sober 2007, [facility]  . 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 Headache   . IBS (irritable bowel syndrome)   . Allergic rhinitis   . Elevated BP   . Herpes   . Nephrolithiasis   . Hyperlipidemia     Past Surgical History  Procedure Date  . Basket surgery 2005, 06    kidney stones    History  Substance Use Topics  . Smoking status: Former Games developer  . Smokeless tobacco: Former Neurosurgeon    Quit date: 12/04/2008  . Alcohol Use: No     Comment: recovering AA    Family History  Problem Relation Age of Onset  . Alcohol abuse Other     GP  . Breast cancer Cousin   . Stroke Other     GGM    Allergies  Allergen Reactions  . Sulfamethoxazole W-Trimethoprim     REACTION: itching    Medication list has been reviewed and updated.  Outpatient Prescriptions Prior to Visit  Medication Sig Dispense Refill  . albuterol (PROAIR HFA) 108 (90 BASE) MCG/ACT inhaler Inhale 2 puffs into the lungs every 4 (four) hours as needed. Use sparingly  1 Inhaler  4  . atomoxetine (STRATTERA) 40 MG capsule Take 1 capsule (40 mg total) by mouth 2  (two) times daily.  60 capsule  5  . beclomethasone (QVAR) 80 MCG/ACT inhaler Inhale 2 puffs into the lungs 2 (two) times daily.  1 Inhaler  11  . Dextromethorphan-Guaifenesin 60-1200 MG per 12 hr tablet Take 1 tablet by mouth every 12 (twelve) hours.  60 tablet  0  . fexofenadine (ALLEGRA) 180 MG tablet Take 180 mg by mouth daily.        . fluconazole (DIFLUCAN) 150 MG tablet Use as directed. Take 1, repeat in 1 week if symptomatic  1 tablet  0  . hyoscyamine (LEVSIN SL) 0.125 MG SL tablet Place 0.125 mg under the tongue every 4 (four) hours as needed.      Marland Kitchen levonorgestrel-ethinyl estradiol (ORSYTHIA) 0.1-20 MG-MCG tablet Take 1 tablet by mouth daily.  28 tablet  11  . lidocaine-prilocaine (EMLA) cream Apply topically. Four times a day to affected area, 1 month supply       . triamcinolone cream (KENALOG) 0.1 % Apply topically 2 (two) times daily.  454 g  0  . valACYclovir (VALTREX) 500 MG tablet Take 1 tablet (500 mg total) by mouth daily.  30 tablet  6   Last reviewed on 08/15/2012 12:36 PM by Excell Seltzer, MD  Review of Systems:  ROS: GEN: Acute illness details above GI: Tolerating PO intake GU: maintaining adequate hydration and urination Pulm: No SOB Interactive and getting along well at home.  Otherwise, ROS is as per the HPI.   Physical Examination: Filed Vitals:   09/05/12 0929  BP: 120/78  Pulse: 95  Temp: 98.2 F (36.8 C)  TempSrc: Oral  Height: 5\' 1"  (1.549 m)  Weight: 172 lb 12.8 oz (78.382 kg)  SpO2: 97%    Body mass index is 32.65 kg/(m^2). Ideal Body Weight: Weight in (lb) to have BMI = 25: 132    Gen: WDWN, NAD; A & O x3, cooperative. Pleasant.Globally Non-toxic HEENT: Normocephalic and atraumatic. Throat clear - 2 small pustules on cheek, w/o exudate, R TM clear, L TM - good landmarks, No fluid present. rhinnorhea.  MMM Frontal sinuses: NT Max sinuses: NT NECK: Anterior cervical  LAD is absent EXT: No c/c/e PSYCH: Friendly, good eye contact MSK: Nml  gait    Assessment and Plan:  1. Sore throat  POCT rapid strep A   Reassured - likely viral process Gargle, rest as needed  Results for orders placed in visit on 09/05/12  POCT RAPID STREP A (OFFICE)      Component Value Range   Rapid Strep A Screen Negative  Negative     Orders Today:  Orders Placed This Encounter  Procedures  . POCT rapid strep A    Updated Medication List: (Includes new medications, updates to list, dose adjustments) No orders of the defined types were placed in this encounter.    Medications Discontinued: There are no discontinued medications.   [name], MD

## 2012-09-27 ENCOUNTER — Telehealth: Payer: Self-pay | Admitting: Family Medicine

## 2012-09-27 NOTE — Telephone Encounter (Signed)
LMOVM of call back number. 

## 2012-09-27 NOTE — Telephone Encounter (Addendum)
Will route to Dr. D who will review.

## 2012-09-27 NOTE — Telephone Encounter (Signed)
Patient Information:  Caller Name: Donique  Phone: [phone]  Patient: Candice Mcgrath, Candice Mcgrath  Gender: Female  DOB: 1976-03-24  Age: 36 Years  PCP: [name] [facility])  Pregnant: No   Symptoms  Reason For Call & Symptoms: allergist put patient on antibiotic for sinus infection, and also skipped her first and third dose of bcp the first week.  After intercourse 09/26/12 wondered if she should take Morning After pill?  Reviewed Health History In EMR: Yes  Reviewed Medications In EMR: Yes  Reviewed Allergies In EMR: Yes  Reviewed Surgeries / Procedures: Yes  Date of Onset of Symptoms: Unknown OB:  LMP: 09/19/2012  Guideline(s) Used:  Contraception - Emergency  Disposition Per Guideline:   Callback by PCP Today  Reason For Disposition Reached:   Caller requests prescription for Emergency Contraceptive Pill(s)  Advice Given:  ECP - Side Effects:  Nausea: 30-60% of women  Vomiting: 5-20% of women  Stomach pain: 10-20% of women  Fatigue and headache: 10-20% of women  Change in periods: 50% of women  General Information:  Emergency contraceptive pills (ECP) work well to prevent pregnancy after sex. Emergency birth control pills work best if taken right away after sex, but can be taken up to 5 days after sex.  ECPs are just for emergency situations.  ECPs do not work as well as regular birth control methods in preventing pregnancy.  Office Follow Up:  Does the office need to follow up with this patient?: Yes  Instructions For The Office: Rx for Plan B emergency contraception, if indicated; also wants to know if needs to start new pack of BCP?  RN Note:  States taking zpack, day 4, for sinus infection.  States "made up" her two missed doses of BCP.  Info to office for requested Rx to be covered by insurance; does question if she does take it, does she need to start a new pack of BCP?   Info to office for provider review/Rx/callback.  [facility], but states she  "knows they are out of the medication."  May reach patient at [phone].  krs/can

## 2012-09-27 NOTE — Telephone Encounter (Signed)
D/w pharmacy at [facility].  If she has already taken an extra dose by "doubling up" then she wouldn't likely get extra benefit from plan B.  She can continue with regular pack of pills.  Advise back up birth control, ie condoms, with OCPs.  Thanks.

## 2012-10-03 ENCOUNTER — Telehealth: Payer: Self-pay | Admitting: Family Medicine

## 2012-10-03 NOTE — Telephone Encounter (Signed)
Patient advised via message on machine 
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
## 2012-10-03 NOTE — Telephone Encounter (Signed)
Can you call now and let know that I will call at the end of the work day to talk to her. Make sure we have the right cell # and she keeps on.

## 2012-10-03 NOTE — Telephone Encounter (Signed)
Pt does not have future appt scheduled.

## 2012-10-04 MED ORDER — FLUOXETINE HCL 20 MG PO TABS: 20.0000 mg | ORAL_TABLET | Freq: Every day | ORAL | Status: DC

## 2012-10-04 NOTE — Telephone Encounter (Signed)
D/w her on the phone - unfortunate situation.  No benzos, but she should be fine to take an SSRI.  Discussed and will start Prozac.

## 2012-12-23 ENCOUNTER — Other Ambulatory Visit: Payer: Self-pay

## 2012-12-23 ENCOUNTER — Encounter: Payer: Self-pay | Admitting: Family Medicine

## 2012-12-23 ENCOUNTER — Ambulatory Visit (INDEPENDENT_AMBULATORY_CARE_PROVIDER_SITE_OTHER): Payer: BC Managed Care – PPO | Admitting: Family Medicine

## 2012-12-23 VITALS — BP 100/70 | HR 82 | Temp 97.7°F | Ht 61.0 in | Wt 186.2 lb

## 2012-12-23 DIAGNOSIS — F329 Major depressive disorder, single episode, unspecified: Secondary | ICD-10-CM

## 2012-12-23 DIAGNOSIS — R51 Headache: Secondary | ICD-10-CM

## 2012-12-23 DIAGNOSIS — F1921 Other psychoactive substance dependence, in remission: Secondary | ICD-10-CM

## 2012-12-23 MED ORDER — ATOMOXETINE HCL 40 MG PO CAPS: 40.0000 mg | ORAL_CAPSULE | Freq: Two times a day (BID) | ORAL | Status: DC

## 2012-12-23 MED ORDER — FLUOXETINE HCL 20 MG PO TABS: 20.0000 mg | ORAL_TABLET | Freq: Every day | ORAL | Status: DC

## 2012-12-23 MED ORDER — AMITRIPTYLINE HCL 10 MG PO TABS: 10.0000 mg | ORAL_TABLET | Freq: Every day | ORAL | Status: DC
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
## 2012-12-23 NOTE — Telephone Encounter (Signed)
[facility]ed refill request Strattera.Please advise.

## 2012-12-24 ENCOUNTER — Encounter: Payer: Self-pay | Admitting: Family Medicine

## 2012-12-24 DIAGNOSIS — G43909 Migraine, unspecified, not intractable, without status migrainosus: Secondary | ICD-10-CM

## 2012-12-24 DIAGNOSIS — F1921 Other psychoactive substance dependence, in remission: Secondary | ICD-10-CM | POA: Insufficient documentation

## 2012-12-24 DIAGNOSIS — F339 Major depressive disorder, recurrent, unspecified: Secondary | ICD-10-CM | POA: Insufficient documentation

## 2012-12-24 HISTORY — DX: Migraine, unspecified, not intractable, without status migrainosus: G43.909

## 2013-01-01 ENCOUNTER — Telehealth: Payer: Self-pay

## 2013-01-01 NOTE — Telephone Encounter (Signed)
[facility] Str[facility]a.form is in Dr Copland's in box. Tried to reach pt to get other meds tried and reason for failure. Unable to reach pt by phone.

## 2013-01-03 ENCOUNTER — Telehealth: Payer: Self-pay | Admitting: *Deleted

## 2013-01-03 NOTE — Telephone Encounter (Signed)
[facility] has faxed another prior auth form for strattera.  They said the first form they received was imcomplete.  Form is on your desk.

## 2013-01-03 NOTE — Telephone Encounter (Signed)
Ok, i completed last one.   [name], MD 01/03/2013, 9:47 AM

## 2013-01-09 ENCOUNTER — Telehealth: Payer: Self-pay | Admitting: Family Medicine

## 2013-01-09 NOTE — Telephone Encounter (Signed)
Patient Information:  Caller Name: Berlene  Phone: [phone]  Patient: Candice Mcgrath, Candice Mcgrath  Gender: Female  DOB: 1976-07-25  Age: 37 Years  PCP: [name] [facility])  Pregnant: No  Office Follow Up:  Does the office need to follow up with this patient?: Yes  Instructions For The Office: She also giggles after describing her sx and pain.   Symptoms  Reason For Call & Symptoms: Patient calling with her second migraine h/a this week.  Has had same 3/18.  She is unclear about the [facility].  No [facility] seen in [facility] for same.  She rates the pain 7/10.  Wants to talk to Dr. Patsy Lager about the recommendations from the [facility] prior to making the life changes suggested.  Reviewed Health History In EMR: Yes  Reviewed Medications In EMR: Yes  Reviewed Allergies In EMR: Yes  Reviewed Surgeries / Procedures: Yes  Date of Onset of Symptoms: 01/07/2013 OB / GYN:  LMP: 01/09/2013  Guideline(s) Used:  Headache  Disposition Per Guideline:   Callback by PCP or Subspecialist within 1 Hour  Reason For Disposition Reached:   Severe headache and not relieved by pain meds  Advice Given:  N/A  Patient Will Follow Care Advice:  YES

## 2013-01-09 NOTE — Telephone Encounter (Signed)
HA Tylenol, motrin, coffee.

## 2013-02-14 ENCOUNTER — Encounter: Payer: Self-pay | Admitting: *Deleted

## 2013-02-14 ENCOUNTER — Ambulatory Visit (INDEPENDENT_AMBULATORY_CARE_PROVIDER_SITE_OTHER): Payer: BC Managed Care – PPO | Admitting: Family Medicine

## 2013-02-14 ENCOUNTER — Encounter: Payer: Self-pay | Admitting: Family Medicine

## 2013-02-14 VITALS — BP 120/64 | HR 94 | Temp 98.1°F | Ht 61.0 in | Wt 173.0 lb

## 2013-02-14 DIAGNOSIS — N898 Other specified noninflammatory disorders of vagina: Secondary | ICD-10-CM

## 2013-02-14 DIAGNOSIS — A499 Bacterial infection, unspecified: Secondary | ICD-10-CM

## 2013-02-14 DIAGNOSIS — N760 Acute vaginitis: Secondary | ICD-10-CM | POA: Insufficient documentation

## 2013-02-14 DIAGNOSIS — B9689 Other specified bacterial agents as the cause of diseases classified elsewhere: Secondary | ICD-10-CM

## 2013-02-14 LAB — POCT WET PREP (WET MOUNT): KOH Wet Prep POC: NEGATIVE

## 2013-02-14 MED ORDER — METRONIDAZOLE 500 MG PO TABS: 500.0000 mg | ORAL_TABLET | Freq: Two times a day (BID) | ORAL | Status: DC

## 2013-02-14 NOTE — Progress Notes (Signed)
  Subjective:    Patient ID: Candice Mcgrath, female    DOB: 1976/09/18, 37 y.o.   MRN: [id number]  HPI [name] presents with 1 week of vaginal itching, white discharge, mild odor. No new sexual partners, no concern about STDs. No recent antibiotics. No fever, no abdominal pain. Mild back pain. Has history BV and candidal infections, last issue was 07/2012.    Review of Systems  Constitutional: Negative for fever and fatigue.  HENT: Negative for ear pain.   Eyes: Negative for pain.  Respiratory: Negative for chest tightness and shortness of breath.   Cardiovascular: Negative for chest pain, palpitations and leg swelling.  Gastrointestinal: Negative for abdominal pain.  Genitourinary: Negative for dysuria.       Objective:   Physical Exam  Constitutional: She appears well-developed and well-nourished.  Neck: Normal range of motion. Neck supple.  Cardiovascular: Normal rate.   Pulmonary/Chest: Effort normal and breath sounds normal.  Abdominal: Soft. Bowel sounds are normal. There is no tenderness.  Genitourinary: There is no rash on the right labia. There is no rash on the left labia. There is erythema around the vagina. No tenderness or bleeding around the vagina. No foreign body around the vagina. No signs of injury around the vagina. Vaginal discharge found.          Assessment & Plan:

## 2013-02-14 NOTE — Patient Instructions (Addendum)
Treat with metronidazole orally.

## 2013-02-14 NOTE — Assessment & Plan Note (Signed)
Treat with metronidazole 

## 2013-03-25 ENCOUNTER — Telehealth: Payer: Self-pay | Admitting: Family Medicine

## 2013-03-25 NOTE — Telephone Encounter (Signed)
Patient is leaving for [place] and would like to know what vaccinations she would need.

## 2013-03-26 NOTE — Telephone Encounter (Signed)
Left message for patient to return call.

## 2013-03-26 NOTE — Telephone Encounter (Signed)
Call   You basically don't need anything different from living in [place].  CDC says Hep a and b are optional (as they are in the [place]) Flu annual vaccine is also recommended  I have personally been to [place] and [place] multiple times and never had additional vaccines.

## 2013-03-31 NOTE — Telephone Encounter (Signed)
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

## 2013-04-05 ENCOUNTER — Other Ambulatory Visit: Payer: Self-pay | Admitting: Family Medicine

## 2013-05-25 ENCOUNTER — Other Ambulatory Visit: Payer: Self-pay | Admitting: Family Medicine

## 2013-05-26 ENCOUNTER — Telehealth: Payer: Self-pay

## 2013-05-26 MED ORDER — LEVONORGESTREL-ETHINYL ESTRAD 0.1-20 MG-MCG PO TABS: 1.0000 | ORAL_TABLET | Freq: Every day | ORAL | Status: DC

## 2013-05-26 NOTE — Telephone Encounter (Signed)
Pt notified pt med called in as requested. Pt will keep appt 069/18/14.

## 2013-05-26 NOTE — Telephone Encounter (Signed)
Pt left v/m requesting refill orsythia until CPX scheduled 07/10/13 [facility].Done left v/m for pt to cb.

## 2013-06-30 ENCOUNTER — Encounter: Payer: Self-pay | Admitting: Family Medicine

## 2013-06-30 ENCOUNTER — Telehealth: Payer: Self-pay

## 2013-06-30 NOTE — Telephone Encounter (Signed)
Pt is attending [employer] and needs signed letter from PCP to summarize pt's ADD condition and to certify diagnosis of ADD. Pt is struggling with a college class and needs letter so pt can receive services provided by college for pts with ADD.  The Adult ADHD self report scale symptom checklist is not sufficient. Needs summary of why PCP determined ADD diagnosis. Call pt when letter ready for pick up.

## 2013-06-30 NOTE — Telephone Encounter (Signed)
Letter is written and in chart. I was unable to print it for some reason. Can you print and stamp for me?

## 2013-06-30 NOTE — Telephone Encounter (Signed)
Pt left v/m that was not audible. Left v/m for pt to cb.(caller ID left phone #).

## 2013-07-01 NOTE — Telephone Encounter (Signed)
Candice Mcgrath notified letter is ready to be picked up at front desk.

## 2013-07-02 ENCOUNTER — Other Ambulatory Visit: Payer: Self-pay | Admitting: Family Medicine

## 2013-07-02 DIAGNOSIS — R5381 Other malaise: Secondary | ICD-10-CM

## 2013-07-02 DIAGNOSIS — Z13220 Encounter for screening for lipoid disorders: Secondary | ICD-10-CM

## 2013-07-03 ENCOUNTER — Other Ambulatory Visit (INDEPENDENT_AMBULATORY_CARE_PROVIDER_SITE_OTHER): Payer: BC Managed Care – PPO

## 2013-07-03 ENCOUNTER — Ambulatory Visit: Payer: BC Managed Care – PPO

## 2013-07-03 DIAGNOSIS — E785 Hyperlipidemia, unspecified: Secondary | ICD-10-CM

## 2013-07-03 DIAGNOSIS — Z13220 Encounter for screening for lipoid disorders: Secondary | ICD-10-CM

## 2013-07-03 DIAGNOSIS — R7309 Other abnormal glucose: Secondary | ICD-10-CM

## 2013-07-03 DIAGNOSIS — R5381 Other malaise: Secondary | ICD-10-CM

## 2013-07-03 LAB — CBC WITH DIFFERENTIAL/PLATELET
Basophils Absolute: 0 10*3/uL (ref 0.0–0.1)
Basophils Relative: 0.4 % (ref 0.0–3.0)
Eosinophils Absolute: 0.2 10*3/uL (ref 0.0–0.7)
Hemoglobin: 14.2 g/dL (ref 12.0–15.0)
Lymphocytes Relative: 20.1 % (ref 12.0–46.0)
MCHC: 33.7 g/dL (ref 30.0–36.0)
MCV: 92.1 fl (ref 78.0–100.0)
Monocytes Absolute: 0.7 10*3/uL (ref 0.1–1.0)
Neutro Abs: 8 10*3/uL — ABNORMAL HIGH (ref 1.4–7.7)
Neutrophils Relative %: 72.1 % (ref 43.0–77.0)
RBC: 4.57 Mil/uL (ref 3.87–5.11)
RDW: 13 % (ref 11.5–14.6)

## 2013-07-03 LAB — LIPID PANEL
Cholesterol: 158 mg/dL (ref 0–200)
Triglycerides: 121 mg/dL (ref 0.0–149.0)

## 2013-07-03 LAB — HEPATIC FUNCTION PANEL
AST: 18 U/L (ref 0–37)
Albumin: 3.6 g/dL (ref 3.5–5.2)
Alkaline Phosphatase: 72 U/L (ref 39–117)
Bilirubin, Direct: 0 mg/dL (ref 0.0–0.3)
Total Protein: 7.2 g/dL (ref 6.0–8.3)

## 2013-07-03 LAB — BASIC METABOLIC PANEL
BUN: 10 mg/dL (ref 6–23)
Chloride: 111 mEq/L (ref 96–112)
Creatinine, Ser: 0.9 mg/dL (ref 0.4–1.2)
GFR: 76.38 mL/min (ref >60.00)

## 2013-07-10 ENCOUNTER — Ambulatory Visit (INDEPENDENT_AMBULATORY_CARE_PROVIDER_SITE_OTHER): Payer: BC Managed Care – PPO | Admitting: Family Medicine

## 2013-07-10 ENCOUNTER — Encounter: Payer: Self-pay | Admitting: Family Medicine

## 2013-07-10 VITALS — BP 100/80 | HR 90 | Temp 98.3°F | Ht 61.0 in | Wt 186.5 lb

## 2013-07-10 DIAGNOSIS — R7303 Prediabetes: Secondary | ICD-10-CM

## 2013-07-10 DIAGNOSIS — R7309 Other abnormal glucose: Secondary | ICD-10-CM

## 2013-07-10 DIAGNOSIS — E669 Obesity, unspecified: Secondary | ICD-10-CM

## 2013-07-10 DIAGNOSIS — N898 Other specified noninflammatory disorders of vagina: Secondary | ICD-10-CM

## 2013-07-10 DIAGNOSIS — Z23 Encounter for immunization: Secondary | ICD-10-CM

## 2013-07-10 DIAGNOSIS — Z0000 Encounter for general adult medical examination without abnormal findings: Secondary | ICD-10-CM

## 2013-07-10 LAB — POCT WET PREP (WET MOUNT): Clue Cells Wet Prep Whiff POC: NEGATIVE

## 2013-07-10 MED ORDER — VALACYCLOVIR HCL 500 MG PO TABS: 500.0000 mg | ORAL_TABLET | Freq: Every day | ORAL | Status: DC | PRN

## 2013-07-10 MED ORDER — ALBUTEROL SULFATE HFA 108 (90 BASE) MCG/ACT IN AERS: 2.0000 | INHALATION_SPRAY | RESPIRATORY_TRACT | Status: DC | PRN

## 2013-07-10 MED ORDER — BECLOMETHASONE DIPROPIONATE 80 MCG/ACT IN AERS: 2.0000 | INHALATION_SPRAY | Freq: Two times a day (BID) | RESPIRATORY_TRACT | Status: DC

## 2013-07-10 MED ORDER — AMITRIPTYLINE HCL 10 MG PO TABS: 10.0000 mg | ORAL_TABLET | Freq: Every day | ORAL | Status: DC

## 2013-07-10 MED ORDER — FLUOXETINE HCL 20 MG PO TABS: 20.0000 mg | ORAL_TABLET | Freq: Every day | ORAL | Status: DC

## 2013-07-10 MED ORDER — MONTELUKAST SODIUM 10 MG PO TABS: 10.0000 mg | ORAL_TABLET | Freq: Every day | ORAL | Status: DC

## 2013-07-10 MED ORDER — FEXOFENADINE HCL 180 MG PO TABS: 180.0000 mg | ORAL_TABLET | Freq: Every day | ORAL | Status: DC

## 2013-07-10 MED ORDER — ATOMOXETINE HCL 40 MG PO CAPS: 40.0000 mg | ORAL_CAPSULE | Freq: Two times a day (BID) | ORAL | Status: DC

## 2013-07-10 MED ORDER — LEVONORGESTREL-ETHINYL ESTRAD 0.1-20 MG-MCG PO TABS: 1.0000 | ORAL_TABLET | Freq: Every day | ORAL | Status: DC

## 2013-07-10 MED ORDER — HYOSCYAMINE SULFATE 0.125 MG SL SUBL: 0.1250 mg | SUBLINGUAL_TABLET | SUBLINGUAL | Status: DC | PRN

## 2013-07-10 NOTE — Progress Notes (Signed)
Nature conservation officer at [facility] [address] [place] [address] Phone: [phone] Fax: [phone]  Date:  07/10/2013   Name:  Candice Mcgrath   DOB:  1976/08/12   MRN:  [id number] Gender: female Age: 37 y.o.  Primary Physician:  [name], MD  Evaluating MD: [name], MD   Chief Complaint: Annual Exam   History of Present Illness:  Candice Mcgrath is a 37 y.o. pleasant patient who presents with the following:  CPX:  Bcp.- change to aviane  Last year - right now having some discharge.   No fh of breast or colon  Health Maintenance Summary Reviewed and updated, unless pt declines services.  Tobacco History Reviewed. Non-smoker Alcohol: No concerns, no excessive use Exercise Habits: rare STD concerns: none Drug Use: None Birth control method: OCP Menses regular: yes Lumps or breast concerns: no Breast Cancer Family History: no  Health Maintenance  Topic Date Due  . Tetanus/tdap  09/29/1996  . Influenza Vaccine  05/23/2014  . Pap Smear  12/24/2015   Lipids:    Component Value Date/Time   CHOL 158 07/03/2013 0742   TRIG 121.0 07/03/2013 0742   HDL 45.20 07/03/2013 0742   VLDL 24.2 07/03/2013 0742   CHOLHDL 3 07/03/2013 0742    CBC:    Component Value Date/Time   WBC 11.1* 07/03/2013 0742   HGB 14.2 07/03/2013 0742   HCT 42.1 07/03/2013 0742   PLT 280.0 07/03/2013 0742   MCV 92.1 07/03/2013 0742   NEUTROABS 8.0* 07/03/2013 0742   LYMPHSABS 2.2 07/03/2013 0742   MONOABS 0.7 07/03/2013 0742   EOSABS 0.2 07/03/2013 0742   BASOSABS 0.0 07/03/2013 0742    Lab Results  Component Value Date   TSH 1.29 07/03/2013    Comprehensive Metabolic Panel:    Component Value Date/Time   NA 137 07/03/2013 0742   K 3.6 07/03/2013 0742   CL 111 07/03/2013 0742   CO2 19 07/03/2013 0742   BUN 10 07/03/2013 0742   CREATININE 0.9 07/03/2013 0742   GLUCOSE 148* 07/03/2013 0742   CALCIUM 8.6 07/03/2013 0742   AST 18 07/03/2013 0742   ALT 12 07/03/2013 0742   ALKPHOS 72 07/03/2013 0742   BILITOT 0.4 07/03/2013 0742   PROT 7.2 07/03/2013 0742   ALBUMIN 3.6 07/03/2013 0742     Lab Results  Component Value Date   HGBA1C 6.1 07/03/2013     Labs reviewed with the patient.  Results for orders placed in visit on 07/10/13  POCT WET PREP (WET [place])      Result Value Range   Source Wet Prep POC Vaginal     WBC, Wet Prep HPF POC 5-10     Bacteria Wet Prep HPF POC 3+ rods     BACTERIA WET PREP MORPHOLOGY POC       Clue Cells Wet Prep HPF POC None     CLUE CELLS WET PREP WHIFF POC Negative Whiff     Yeast Wet Prep HPF POC None     KOH Wet Prep POC       Trichomonas Wet Prep HPF POC None       Patient Active Problem List   Diagnosis Date Noted  . BV (bacterial vaginosis) 02/14/2013  . Depression 12/24/2012  . Personal history of drug dependence 12/24/2012  . Headache 12/24/2012  . SCIATICA 11/02/2010  . ASTHMA 06/23/2010  . IBS 12/13/2009  . HYPERLIPIDEMIA 11/08/2009  . ADD 11/08/2009  . FATIGUE 11/08/2009  .  ALLERGIC RHINITIS 11/01/2008  . NEPHROLITHIASIS, HX OF 11/01/2008  . GENITAL HERPES 10/30/2008  . PERSONAL HISTORY OF ALCOHOLISM 10/30/2008    Past Medical History  Diagnosis Date  . ETOH abuse     sober 2007, [facility]  . 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 Headache(784.0)   . IBS (irritable bowel syndrome)   . Allergic rhinitis   . Elevated BP   . Herpes   . Nephrolithiasis   . Hyperlipidemia     Past Surgical History  Procedure Laterality Date  . Basket surgery  2005, 06    kidney stones    History   Social History  . Marital Status: Single    Spouse Name: N/A    Number of Children: N/A  . Years of Education: N/A   Occupational History  . spanish     @ [employer]   Social History Main Topics  . Smoking status: Former Games developer  . Smokeless tobacco: Former Neurosurgeon    Quit date: 12/04/2008  . Alcohol Use: No     Comment: recovering AA  . Drug Use: No     Comment: Recovering  NA  . Sexual Activity: Not on file   Other Topics Concern  . Not on file   Social History Narrative  . No narrative on file    Family History  Problem Relation Age of Onset  . Alcohol abuse Other     GP  . Breast cancer Cousin   . Stroke Other     GGM    Allergies  Allergen Reactions  . Sulfamethoxazole W-Trimethoprim     REACTION: itching    Medication list has been reviewed and updated.  Outpatient Prescriptions Prior to Visit  Medication Sig Dispense Refill  . albuterol (PROAIR HFA) 108 (90 BASE) MCG/ACT inhaler Inhale 2 puffs into the lungs every 4 (four) hours as needed. Use sparingly  1 Inhaler  4  . amitriptyline (ELAVIL) 10 MG tablet TAKE 1 TABLET (10 MG TOTAL) BY MOUTH AT BEDTIME.  30 tablet  3  . atomoxetine (STRATTERA) 40 MG capsule Take 1 capsule (40 mg total) by mouth 2 (two) times daily.  60 capsule  5  . beclomethasone (QVAR) 80 MCG/ACT inhaler Inhale 2 puffs into the lungs 2 (two) times daily.  1 Inhaler  11  . fexofenadine (ALLEGRA) 180 MG tablet Take 180 mg by mouth daily.        Marland Kitchen FLUoxetine (PROZAC) 20 MG tablet TAKE 1 TABLET EVERY DAY  30 tablet  2  . hyoscyamine (LEVSIN SL) 0.125 MG SL tablet Place 0.125 mg under the tongue every 4 (four) hours as needed.      Marland Kitchen levonorgestrel-ethinyl estradiol (ORSYTHIA) 0.1-20 MG-MCG tablet Take 1 tablet by mouth daily.  28 tablet  1  . montelukast (SINGULAIR) 10 MG tablet Take 10 mg by mouth daily.      . valACYclovir (VALTREX) 500 MG tablet Take 1 tablet (500 mg total) by mouth daily.  30 tablet  6  . metroNIDAZOLE (FLAGYL) 500 MG tablet Take 1 tablet (500 mg total) by mouth 2 (two) times daily.  21 tablet  0   No facility-administered medications prior to visit.    Review of Systems:   General: Denies fever, chills, sweats. No significant weight loss. Eyes: Denies blurring,significant itching ENT: Denies earache, sore throat, and hoarseness.  Cardiovascular: Denies chest pains, palpitations, dyspnea on  exertion,  Respiratory: Denies cough, dyspnea at rest,wheeezing  Breast: no concerns about lumps GI: Denies nausea, vomiting, diarrhea, constipation, change in bowel habits, abdominal pain, melena, hematochezia GU: Denies dysuria, hematuria, urinary hesitancy, nocturia, denies STD risk, no concerns about discharge Musculoskeletal: Denies back pain, joint pain Derm: Denies rash, itching Neuro: Denies  paresthesias, frequent falls, frequent headaches Psych: DEPRESSION DOING BETTER Endocrine: Denies cold intolerance, heat intolerance, polydipsia Heme: Denies enlarged lymph nodes Allergy: No hayfever   Physical Examination: BP 100/80  Pulse 90  Temp(Src) 98.3 F (36.8 C) (Oral)  Ht 5\' 1"  (1.549 m)  Wt 186 lb 8 oz (84.596 kg)  BMI 35.26 kg/m2  LMP 06/26/2013  Ideal Body Weight: Weight in (lb) to have BMI = 25: 132   GEN: well developed, well nourished, no acute distress Eyes: conjunctiva and lids normal, PERRLA, EOMI ENT: TM clear, nares clear, oral exam WNL Neck: supple, no lymphadenopathy, no thyromegaly, no JVD Pulm: clear to auscultation and percussion, respiratory effort normal CV: regular rate and rhythm, S1-S2, no murmur, rub or gallop, no bruits Chest: no scars, masses, no lumps BREAST: breast exam normal, chaperoned. No masses. GI: soft, non-tender; no hepatosplenomegaly, masses; active bowel sounds all quadrants GU: GU exam: normal introitus. Scant discharge.  Lymph: no cervical, axillary or inguinal adenopathy MSK: gait normal, muscle tone and strength WNL, no joint swelling, effusions, discoloration, crepitus  SKIN: clear, good turgor, color WNL, no rashes, lesions, or ulcerations Neuro: normal mental status, normal strength, sensation, and motion Psych: alert; oriented to person, place and time, normally interactive and not anxious or depressed in appearance.    Assessment and Plan:  Routine general medical examination at a health care facility  Need for  prophylactic vaccination and inoculation against influenza - Plan: Flu Vaccine QUAD 36+ mos PF IM (Fluarix)  Borderline diabetes mellitus - Plan: Amb ref to Medical Nutrition Therapy-MNT  Obesity, unspecified - Plan: Amb ref to Medical Nutrition Therapy-MNT  Vaginal discharge - Plan: POCT Wet Prep [facility])  The patient's preventative maintenance and recommended screening tests for an annual wellness exam were reviewed in full today. Brought up to date unless services declined.  Counselled on the importance of diet, exercise, and its role in overall health and mortality. The patient's FH and SH was reviewed, including their home life, tobacco status, and drug and alcohol status.   Physiologic d/c  With increased weight, elevated BS > 140, consult dietician for help with BS, weight, diet  Orders Today:  Orders Placed This Encounter  Procedures  . Flu Vaccine QUAD 36+ mos PF IM (Fluarix)  . Amb ref to Medical Nutrition Therapy-MNT    Referral Priority:  Routine    Referral Type:  Consultation    Referral Reason:  Specialty Services Required    Requested Specialty:  Nutrition    Number of Visits Requested:  1  . POCT Wet Prep [facility])    Updated Medication List: (Includes new medications, updates to list, dose adjustments) Meds ordered this encounter  Medications  . DISCONTD: valACYclovir (VALTREX) 500 MG tablet    Sig: Take 500 mg by mouth daily as needed.  . baclofen (LIORESAL) 10 MG tablet    Sig: Take 10 mg by mouth 2 (two) times daily as needed.  . topiramate (TOPAMAX) 100 MG tablet    Sig: Take 100 mg by mouth 2 (two) times daily.  Marland Kitchen levonorgestrel-ethinyl estradiol (AVIANE,ALESSE,LESSINA) 0.1-20 MG-MCG tablet    Sig: Take 1 tablet by mouth daily. aviane    Dispense:  1 Package  Refill:  11  . albuterol (PROAIR HFA) 108 (90 BASE) MCG/ACT inhaler    Sig: Inhale 2 puffs into the lungs every 4 (four) hours as needed. Use sparingly    Dispense:  1 Inhaler     Refill:  4  . amitriptyline (ELAVIL) 10 MG tablet    Sig: Take 1 tablet (10 mg total) by mouth at bedtime.    Dispense:  30 tablet    Refill:  11  . atomoxetine (STRATTERA) 40 MG capsule    Sig: Take 1 capsule (40 mg total) by mouth 2 (two) times daily.    Dispense:  60 capsule    Refill:  11  . beclomethasone (QVAR) 80 MCG/ACT inhaler    Sig: Inhale 2 puffs into the lungs 2 (two) times daily.    Dispense:  1 Inhaler    Refill:  11  . fexofenadine (ALLEGRA) 180 MG tablet    Sig: Take 1 tablet (180 mg total) by mouth daily.    Dispense:  30 tablet    Refill:  11  . FLUoxetine (PROZAC) 20 MG tablet    Sig: Take 1 tablet (20 mg total) by mouth daily.    Dispense:  30 tablet    Refill:  11  . hyoscyamine (LEVSIN SL) 0.125 MG SL tablet    Sig: Place 1 tablet (0.125 mg total) under the tongue every 4 (four) hours as needed.    Dispense:  30 tablet    Refill:  11  . montelukast (SINGULAIR) 10 MG tablet    Sig: Take 1 tablet (10 mg total) by mouth daily.    Dispense:  30 tablet    Refill:  11  . valACYclovir (VALTREX) 500 MG tablet    Sig: Take 1 tablet (500 mg total) by mouth daily as needed.    Dispense:  30 tablet    Refill:  11    Medications Discontinued: Medications Discontinued During This Encounter  Medication Reason  . metroNIDAZOLE (FLAGYL) 500 MG tablet Completed Course  . valACYclovir (VALTREX) 500 MG tablet   . levonorgestrel-ethinyl estradiol (ORSYTHIA) 0.1-20 MG-MCG tablet   . albuterol (PROAIR HFA) 108 (90 BASE) MCG/ACT inhaler Reorder  . amitriptyline (ELAVIL) 10 MG tablet Reorder  . atomoxetine (STRATTERA) 40 MG capsule Reorder  . beclomethasone (QVAR) 80 MCG/ACT inhaler Reorder  . fexofenadine (ALLEGRA) 180 MG tablet Reorder  . FLUoxetine (PROZAC) 20 MG tablet Reorder  . hyoscyamine (LEVSIN SL) 0.125 MG SL tablet Reorder  . montelukast (SINGULAIR) 10 MG tablet Reorder  . valACYclovir (VALTREX) 500 MG tablet Reorder      Signed, Karleen Hampshire T. Ayane Delancey,  MD 07/10/2013 2:53 PM

## 2013-07-10 NOTE — Patient Instructions (Addendum)
REFERRAL: GO THE THE FRONT ROOM AT THE ENTRANCE OF OUR CLINIC, NEAR CHECK IN. ASK FOR Candice Mcgrath. SHE WILL HELP YOU SET UP YOUR REFERRAL. DATE: TIME:  

## 2013-08-20 ENCOUNTER — Encounter: Payer: BC Managed Care – PPO | Attending: Family Medicine | Admitting: *Deleted

## 2013-08-20 ENCOUNTER — Encounter: Payer: Self-pay | Admitting: *Deleted

## 2013-08-20 VITALS — Ht 61.0 in | Wt 192.4 lb

## 2013-08-20 DIAGNOSIS — R7309 Other abnormal glucose: Secondary | ICD-10-CM

## 2013-08-20 DIAGNOSIS — Z713 Dietary counseling and surveillance: Secondary | ICD-10-CM | POA: Insufficient documentation

## 2013-08-20 DIAGNOSIS — E669 Obesity, unspecified: Secondary | ICD-10-CM | POA: Insufficient documentation

## 2013-08-20 NOTE — Progress Notes (Signed)
  Medical Nutrition Therapy:  Appt start time: 1645 end time:  1745.  Assessment:  Primary concerns today: patient here for obesity and pre-diabetes with A1c of 6.1%. She lives with boyfriend Mikle Bosworth. They share food shopping and meal preparation. They are "crock potting" and freezing food for left overs. She is in graduate school studying Languages, Literatures and Cultures and works as Barrister's clerk in middle school. Long days with school and work. They are members of [employer] and would like to attend classes there. Has done Zumba and ball room dancing in the past.  Preferred Learning Style: all of the below  Auditory  Visual  Hands on  Learning Readiness:   Ready  MEDICATIONS: see list   DIETARY INTAKE:  Usual eating pattern includes 2-3 meals and 2-3 snacks per day.  Everyday foods include fair variety of most food groups.  Avoided foods include: many vegetables and regular soda now.    24-hr recall:  B ( AM): skips usually or has a PopTart, coffee with cream and sugar or black  Snk ( AM): occasionally Pop Tart or whatever is in her bag L ( PM): brings from home or PNB box with PNB sandwich, Gold Fish crackers, string cheese, flavored water  Snk ( PM): same as AM D ( PM): trying to cook more and using Crock pot lately, OR pizza  Snk ( PM): ice cream or cookies Beverages: coffee, flavored water  Usual physical activity: limited due to work and school schedule but motivated to start going to gym as able.  Estimated energy needs: 140 calories 158 g carbohydrates 105 g protein 39 g fat  Progress Towards Goal(s):  In progress.   Nutritional Diagnosis:  NB-1.1 Food and nutrition-related knowledge deficit As related to pre-diabetes and obesity.  As evidenced by new diagnosis of pre-diabetes and BMI of 36.4    Intervention:  Nutrition counseling counseling and pre-diabetes education initiated. Discussed basic physiology of diabetes in comparison to pre-diabetes, A1c, Carb  Counting and reading food labels, and benefits of increased activity. Provided her with Grocery List and suggestions of shopping for healthy foods to have in the house. With her very busy schedule, suggested The 7 Minute Workout APP available on her cell phone. Plan to discuss fat intake and suggested intake of fat at next visit.  Plan:  Aim for 3 Carb Choices per meal (45 grams) +/- 1 either way  Aim for 0-2 Carbs per snack if hungry  Consider reading food labels for Total Carbohydrate of foods Consider  increasing your activity level by going to gym as tolerated Consider The 7 Minute Workout so you can work out at home.  Teaching Method Utilized: all of the following Visual Auditory Hands on  Handouts given during visit include: Living Well with Diabetes Carb Counting and Food Label handouts Meal Plan Card  Grocery List  Barriers to learning/adherence to lifestyle change: busy schedule and history of overeating higher calorie foods  Demonstrated degree of understanding via:  Teach Back   Monitoring/Evaluation:  Dietary intake, exercise, reading food labels, and body weight prn. She plans to check on her insurance coverage for MNT visits before making next appointment.

## 2013-08-20 NOTE — Patient Instructions (Signed)
Plan:  Aim for 3 Carb Choices per meal (45 grams) +/- 1 either way  Aim for 0-2 Carbs per snack if hungry  Consider reading food labels for Total Carbohydrate of foods Consider  increasing your activity level by going to gym as tolerated Consider The 7 Minute Workout so you can work out at home.

## 2013-08-29 ENCOUNTER — Ambulatory Visit (INDEPENDENT_AMBULATORY_CARE_PROVIDER_SITE_OTHER): Payer: BC Managed Care – PPO | Admitting: Family Medicine

## 2013-08-29 ENCOUNTER — Telehealth: Payer: Self-pay | Admitting: Family Medicine

## 2013-08-29 ENCOUNTER — Encounter: Payer: Self-pay | Admitting: Family Medicine

## 2013-08-29 VITALS — BP 119/85 | HR 89 | Temp 98.1°F | Ht 61.0 in | Wt 194.5 lb

## 2013-08-29 DIAGNOSIS — A084 Viral intestinal infection, unspecified: Secondary | ICD-10-CM

## 2013-08-29 DIAGNOSIS — K589 Irritable bowel syndrome without diarrhea: Secondary | ICD-10-CM

## 2013-08-29 DIAGNOSIS — A088 Other specified intestinal infections: Secondary | ICD-10-CM

## 2013-08-29 MED ORDER — ONDANSETRON HCL 4 MG PO TABS: 4.0000 mg | ORAL_TABLET | Freq: Three times a day (TID) | ORAL | Status: DC | PRN

## 2013-08-29 NOTE — Patient Instructions (Addendum)
Push fluids. Rest. Use levsin prn for cramping. Can use zofran for nausea if needed. Start probiotic OTC. Return to normal diet. Call if not improving as expected in next 5-7 days.

## 2013-08-29 NOTE — Progress Notes (Signed)
  Subjective:    Patient ID: Candice Mcgrath, female    DOB: [birth year], 37 y.o.   MRN: [id number]  HPI  37 year old female with history of IBS  Constipation predominent presents with sudden onset pain and cramping in abdomen diffusely 5 days ago...  Went to BM, minimal relief.  Used levsin for symptoms of IBS... Helped some. No diarrhea, has had nausea, no emesis.  Chills, no fever.  She has had some mild ttp in right UQ.  No blood in stool.  No dysuria.  She has some sick contacts with GI illness but they are all better. No recent travel. Works as a Runner, broadcasting/film/video. No recent antibiotics.  Not drinking from a stream.   Review of Systems  Constitutional: Negative for fever and fatigue.  HENT: Negative for ear pain.   Eyes: Negative for pain.  Respiratory: Negative for chest tightness and shortness of breath.   Cardiovascular: Negative for chest pain, palpitations and leg swelling.  Gastrointestinal: Positive for nausea, abdominal pain and abdominal distention. Negative for diarrhea and constipation.  Genitourinary: Negative for dysuria.       Objective:   Physical Exam  Constitutional: Vital signs are normal. She appears well-developed and well-nourished. She is cooperative.  Non-toxic appearance. She does not appear ill. No distress.  HENT:  Head: Normocephalic.  Right Ear: Hearing, tympanic membrane, external ear and ear canal normal. Tympanic membrane is not erythematous, not retracted and not bulging.  Left Ear: Hearing, tympanic membrane, external ear and ear canal normal. Tympanic membrane is not erythematous, not retracted and not bulging.  Nose: No mucosal edema or rhinorrhea. Right sinus exhibits no maxillary sinus tenderness and no frontal sinus tenderness. Left sinus exhibits no maxillary sinus tenderness and no frontal sinus tenderness.  Mouth/Throat: Uvula is midline, oropharynx is clear and moist and mucous membranes are normal.  Eyes: Conjunctivae, EOM and lids are  normal. Pupils are equal, round, and reactive to light. Lids are everted and swept, no foreign bodies found.  Neck: Trachea normal and normal range of motion. Neck supple. Carotid bruit is not present. No mass and no thyromegaly present.  Cardiovascular: Normal rate, regular rhythm, S1 normal, S2 normal, normal heart sounds, intact distal pulses and normal pulses.  Exam reveals no gallop and no friction rub.   No murmur heard. Pulmonary/Chest: Effort normal and breath sounds normal. Not tachypneic. No respiratory distress. She has no decreased breath sounds. She has no wheezes. She has no rhonchi. She has no rales.  Abdominal: Soft. Normal appearance and bowel sounds are normal. There is generalized tenderness.  Neurological: She is alert.  Skin: Skin is warm, dry and intact. No rash noted.  Psychiatric: Her speech is normal and behavior is normal. Judgment and thought content normal. Her mood appears not anxious. Cognition and memory are normal. She does not exhibit a depressed mood.          Assessment & Plan:

## 2013-08-29 NOTE — Progress Notes (Signed)
Pre-visit discussion using our clinic review tool. No additional management support is needed unless otherwise documented below in the visit note.  

## 2013-08-29 NOTE — Telephone Encounter (Signed)
Patient Information:  Caller Name: [name]  Phone: [phone]  Patient: Candice Mcgrath, Candice Mcgrath  Gender: Female  DOB: 1976/06/01  Age: 37 Years  PCP: [name] (Family Practice)  Pregnant: No  Office Follow Up:  Does the office need to follow up with this patient?: No  Instructions For The Office: N/A   Symptoms  Reason For Call & Symptoms: Pt is calling and states that she has IBS with constipation; has been going to bathroom regularly all week; but having stomach cramps; no vomiting; pt is having BM every day and this is unusual for her since she usually goes every couple days;  no fever;  Reviewed Health History In EMR: Yes  Reviewed Medications In EMR: Yes  Reviewed Allergies In EMR: Yes  Reviewed Surgeries / Procedures: Yes  Date of Onset of Symptoms: 08/24/2013 OB / GYN:  LMP: 08/20/2013  Guideline(s) Used:  Abdominal Pain - Female  Disposition Per Guideline:   See Today in Office  Reason For Disposition Reached:   Moderate or mild pain that comes and goes (cramps) lasts > 24 hours  Advice Given:  Call Back If:  You become worse.  Patient Will Follow Care Advice:  YES  Appointment Scheduled:  08/29/2013 16:00:00 Appointment Scheduled Provider:  [facility])

## 2013-08-29 NOTE — Telephone Encounter (Signed)
Probably could be handles over the phone by pt has appt already scheduled. Will discuss then.

## 2013-08-30 DIAGNOSIS — A084 Viral intestinal infection, unspecified: Secondary | ICD-10-CM | POA: Insufficient documentation

## 2013-08-30 NOTE — Assessment & Plan Note (Signed)
Restart antispasmodic for pain. Can try probiotic to help return symptoms to baseline.

## 2013-08-30 NOTE — Assessment & Plan Note (Addendum)
Symptomatic care. Fluids, rest. Can use zofran for nausea if needed.

## 2013-09-23 ENCOUNTER — Ambulatory Visit (INDEPENDENT_AMBULATORY_CARE_PROVIDER_SITE_OTHER): Payer: BC Managed Care – PPO | Admitting: Family Medicine

## 2013-09-23 ENCOUNTER — Encounter: Payer: Self-pay | Admitting: Family Medicine

## 2013-09-23 VITALS — BP 100/64 | HR 100 | Temp 97.9°F | Ht 61.0 in | Wt 197.8 lb

## 2013-09-23 DIAGNOSIS — N912 Amenorrhea, unspecified: Secondary | ICD-10-CM

## 2013-09-23 DIAGNOSIS — M546 Pain in thoracic spine: Secondary | ICD-10-CM

## 2013-09-23 DIAGNOSIS — M549 Dorsalgia, unspecified: Secondary | ICD-10-CM | POA: Insufficient documentation

## 2013-09-23 LAB — HCG, QUANTITATIVE, PREGNANCY: hCG, Beta Chain, Quant, S: 0.47 m[IU]/mL

## 2013-09-23 LAB — POCT URINE PREGNANCY: Preg Test, Ur: NEGATIVE

## 2013-09-23 NOTE — Patient Instructions (Signed)
Stop at lab for serum pregnancy test. Start home stretching, heat per hand out given. If pregnancy test id negative we will consider starting antiinflamatory for pain and inflammation.

## 2013-09-23 NOTE — Assessment & Plan Note (Signed)
Eval with serum B HCG for verification.

## 2013-09-23 NOTE — Progress Notes (Signed)
Pre-visit discussion using our clinic review tool. No additional management support is needed unless otherwise documented below in the visit note.  

## 2013-09-23 NOTE — Assessment & Plan Note (Signed)
Home PT, heat, NSAIDs and possibly muscle relaxant if serum preg negative.

## 2013-09-23 NOTE — Progress Notes (Signed)
Subjective:    Patient ID: Candice Mcgrath, female    DOB: [birth year], 37 y.o.   MRN: [id number]  HPI  [name] presents with  One episode of ammenorrhea.  LMP in 07/2013, light and darker brown. Previously regular.  Has been using OCPs regularly. Using condoms as well. One test at home negative and one positive.  She has been having some low abd cramping and pain in mid upper back.   She has had mid back pain off and on in last month.. was the worst yestrday.  Usually can stretch it out but was unable to yesterday. No numbness or weakness in Lower Ext. She has used advil with minimal improvement.  She carries a back pack once a week. No known injury. She has been told in past by chiropractor that she has pinched nerve in that area.  X-ray: neg.       He pregnancy test here was negative.   Review of Systems  Constitutional: Negative for fever and fatigue.  HENT: Negative for ear pain.   Eyes: Negative for pain.  Respiratory: Negative for chest tightness and shortness of breath.   Cardiovascular: Negative for chest pain, palpitations and leg swelling.  Gastrointestinal: Negative for abdominal pain.  Genitourinary: Negative for dysuria.       Objective:   Physical Exam  Constitutional: Vital signs are normal. She appears well-developed and well-nourished. She is cooperative.  Non-toxic appearance. She does not appear ill. No distress.  HENT:  Head: Normocephalic.  Right Ear: Hearing, tympanic membrane, external ear and ear canal normal. Tympanic membrane is not erythematous, not retracted and not bulging.  Left Ear: Hearing, tympanic membrane, external ear and ear canal normal. Tympanic membrane is not erythematous, not retracted and not bulging.  Nose: No mucosal edema or rhinorrhea. Right sinus exhibits no maxillary sinus tenderness and no frontal sinus tenderness. Left sinus exhibits no maxillary sinus tenderness and no frontal sinus  tenderness.  Mouth/Throat: Uvula is midline, oropharynx is clear and moist and mucous membranes are normal.  Eyes: Conjunctivae, EOM and lids are normal. Pupils are equal, round, and reactive to light. Lids are everted and swept, no foreign bodies found.  Neck: Trachea normal and normal range of motion. Neck supple. Carotid bruit is not present. No mass and no thyromegaly present.  Cardiovascular: Normal rate, regular rhythm, S1 normal, S2 normal, normal heart sounds, intact distal pulses and normal pulses.  Exam reveals no gallop and no friction rub.   No murmur heard. Pulmonary/Chest: Effort normal and breath sounds normal. Not tachypneic. No respiratory distress. She has no decreased breath sounds. She has no wheezes. She has no rhonchi. She has no rales.  Pt has large breasts  Abdominal: Soft. Normal appearance and bowel sounds are normal. There is no tenderness.  Musculoskeletal:       Cervical back: Normal.       Thoracic back: She exhibits tenderness. She exhibits normal range of motion, no bony tenderness, no swelling and no edema.       Lumbar back: Normal.  Neg spurling.  ttp over right Paraspinous muscle and trapezius at insertion  Neurological: She is alert.  Skin: Skin is warm, dry and intact. No rash noted.  Psychiatric: Her speech is normal and behavior is normal. Judgment and thought content normal. Her mood appears not anxious. Cognition and memory are normal. She does not exhibit a depressed mood.  Assessment & Plan:

## 2013-09-24 ENCOUNTER — Telehealth: Payer: Self-pay | Admitting: *Deleted

## 2013-09-24 MED ORDER — MELOXICAM 15 MG PO TABS: 15.0000 mg | ORAL_TABLET | Freq: Every day | ORAL | Status: DC

## 2013-09-24 MED ORDER — TIZANIDINE HCL 4 MG PO TABS: 4.0000 mg | ORAL_TABLET | Freq: Every evening | ORAL | Status: DC

## 2013-09-24 NOTE — Telephone Encounter (Signed)
Go ahead and send in mobic 15 mg, 1 po daily, #30, 2 refills  But, please check with Candice Mcgrath first about the muscle relaxant. She is in recovery, and I would want to be very sure that she is ok with that. They are not narcotic based, but I wanted to double check with her. Stuff like flexeril, zanaflex, skelaxin.

## 2013-09-24 NOTE — Telephone Encounter (Signed)
Patient stopped by today to get her [facility] results.  States Dr. Ermalene Searing was going to send in an anti-inflammatory and muscle relaxant if test was negative.  Will forward to Dr. Caffie Pinto to send in prescriptions.

## 2013-09-24 NOTE — Telephone Encounter (Signed)
We checked, done.

## 2013-09-24 NOTE — Telephone Encounter (Signed)
Mobic 15 mg sent to pharmacy.  I left Allessandra a message on her phone to let her know this prescription has been sent to her pharmacy.  Also ask for a call back to double check and see if she wants a muscle relaxant as well.

## 2013-10-06 ENCOUNTER — Ambulatory Visit (INDEPENDENT_AMBULATORY_CARE_PROVIDER_SITE_OTHER): Payer: BC Managed Care – PPO | Admitting: Family Medicine

## 2013-10-06 ENCOUNTER — Encounter: Payer: Self-pay | Admitting: Family Medicine

## 2013-10-06 VITALS — BP 110/74 | HR 94 | Temp 98.3°F | Ht 61.0 in | Wt 202.5 lb

## 2013-10-06 DIAGNOSIS — J069 Acute upper respiratory infection, unspecified: Secondary | ICD-10-CM

## 2013-10-06 DIAGNOSIS — R042 Hemoptysis: Secondary | ICD-10-CM

## 2013-10-06 NOTE — Progress Notes (Signed)
Pre-visit discussion using our clinic review tool. No additional management support is needed unless otherwise documented below in the visit note.  

## 2013-10-06 NOTE — Progress Notes (Signed)
Patient Name: Candice Mcgrath Date of Birth: Oct 07, 1976 Medical Record Number: [id number]  History of Present Illness:  Patent presents with runny nose, sneezing, cough, sore throat, malaise and minimal / low-grade fever .  This morning coughing up phlegm with a lot of blood in it. Has had a cold for about a week. A fair amount in the phlegm.  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  The patent denies sore throat as the primary complaint. Denies sthortness of breath/wheezing, high fever, chest pain, rhinits for more than 14 days, significant myalgia, otalgia, facial pain, abdominal pain, changes in bowel or bladder.  PMH, PHS, Allergies, Problem List, Medications, Family History, and Social History have all been reviewed.  Patient Active Problem List   Diagnosis Date Noted  . Upper back pain on right side 09/23/2013  . Amenorrhea 09/23/2013  . Major depressive disorder, recurrent 12/24/2012  . Personal history of drug dependence 12/24/2012  . Migraine headache 12/24/2012  . SCIATICA 11/02/2010  . ASTHMA 06/23/2010  . IBS 12/13/2009  . HYPERLIPIDEMIA 11/08/2009  . ADD 11/08/2009  . FATIGUE 11/08/2009  . ALLERGIC RHINITIS 11/01/2008  . NEPHROLITHIASIS, HX OF 11/01/2008  . GENITAL HERPES 10/30/2008  . PERSONAL HISTORY OF ALCOHOLISM 10/30/2008    Past Medical History  Diagnosis Date  . ETOH abuse     sober 2007, [facility]  . 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 Headache(784.0)   . IBS (irritable bowel syndrome)   . Allergic rhinitis   . Elevated BP   . Herpes   . Nephrolithiasis   . Hyperlipidemia   . Migraine headache 12/24/2012  . Pre-diabetes   . Obesity     Past Surgical History  Procedure Laterality Date  . Basket surgery  2005, 06    kidney stones    History   Social History  . Marital Status: Single    Spouse Name: N/A    Number of Children: N/A  . Years of Education: N/A   Occupational History  . spanish     @  [employer]   Social History Main Topics  . Smoking status: Former Games developer  . Smokeless tobacco: Former Neurosurgeon    Quit date: 12/04/2008  . Alcohol Use: No     Comment: recovering AA  . Drug Use: No     Comment: Recovering NA  . Sexual Activity: Not on file   Other Topics Concern  . Not on file   Social History Narrative  . No narrative on file    Family History  Problem Relation Age of Onset  . Alcohol abuse Other     GP  . Breast cancer Cousin   . Stroke Other     GGM    Allergies  Allergen Reactions  . Sulfamethoxazole-Trimethoprim     REACTION: itching    Medication list reviewed and updated in full in [place] [facility].  Review of Systems: as above, eating and drinking - tolerating PO. Urinating normally. No excessive vomitting or diarrhea. O/w as above.  Physical Exam:  Filed Vitals:   10/06/13 1148  BP: 110/74  Pulse: 94  Temp: 98.3 F (36.8 C)  TempSrc: Oral  Height: 5\' 1"  (1.549 m)  Weight: 202 lb 8 oz (91.853 kg)    GEN: WDWN, Non-toxic, Atraumatic, normocephalic. A and O x 3. HEENT: Oropharynx clear without exudate, MMM, no significant LAD, mild rhinnorhea Ears: TM clear, COL visualized with good landmarks  CV: RRR, no m/g/r. Pulm: CTA B, no wheezes, rhonchi, or crackles, normal respiratory effort. EXT: no c/c/e Psych: well oriented, neither depressed nor anxious in appearance  A/P: 1. URI. Supportive care reviewed with patient. See patient instruction section.

## 2013-10-10 ENCOUNTER — Ambulatory Visit (INDEPENDENT_AMBULATORY_CARE_PROVIDER_SITE_OTHER): Payer: BC Managed Care – PPO | Admitting: Internal Medicine

## 2013-10-10 VITALS — BP 124/84 | HR 102 | Temp 98.7°F | Resp 18 | Ht 61.0 in | Wt 201.0 lb

## 2013-10-10 DIAGNOSIS — R059 Cough, unspecified: Secondary | ICD-10-CM

## 2013-10-10 DIAGNOSIS — J22 Unspecified acute lower respiratory infection: Secondary | ICD-10-CM

## 2013-10-10 DIAGNOSIS — J988 Other specified respiratory disorders: Secondary | ICD-10-CM

## 2013-10-10 DIAGNOSIS — R05 Cough: Secondary | ICD-10-CM

## 2013-10-10 DIAGNOSIS — J9801 Acute bronchospasm: Secondary | ICD-10-CM

## 2013-10-10 MED ORDER — PREDNISONE 20 MG PO TABS: ORAL_TABLET | ORAL | Status: DC

## 2013-10-10 MED ORDER — AZITHROMYCIN 250 MG PO TABS: ORAL_TABLET | ORAL | Status: DC

## 2013-10-10 MED ORDER — HYDROCODONE-HOMATROPINE 5-1.5 MG/5ML PO SYRP: 5.0000 mL | ORAL_SOLUTION | Freq: Four times a day (QID) | ORAL | Status: DC | PRN

## 2013-10-10 NOTE — Progress Notes (Signed)
Subjective:    Patient ID: Candice Mcgrath, female    DOB: 1976/02/19, 37 y.o.   MRN: [id number] This chart was scribed for Ellamae Sia, MD by Clydene Laming, ED Scribe. This patient was seen in room 9 and the patient's care was started at 6:17 PM. HPI HPI Comments: Candice Mcgrath is a 37 y.o. female who presents to the [facility] complaining of a cough with an associated sore throat onset three days ago. Pt denies fever. Pt states the cough is severe to the point where it become difficult to catch her breath. 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 No history of asthma/no recent change in medication use.  Patient Active Problem List   Diagnosis Date Noted  . Upper back pain on right side 09/23/2013  . Amenorrhea 09/23/2013  . Major depressive disorder, recurrent 12/24/2012  . Personal history of drug dependence 12/24/2012  . Migraine headache 12/24/2012  . SCIATICA 11/02/2010  . ASTHMA 06/23/2010  . IBS 12/13/2009  . HYPERLIPIDEMIA 11/08/2009  . ADD 11/08/2009  . FATIGUE 11/08/2009  . ALLERGIC RHINITIS 11/01/2008  . NEPHROLITHIASIS, HX OF 11/01/2008  . GENITAL HERPES 10/30/2008  . PERSONAL HISTORY OF ALCOHOLISM 10/30/2008     Allergies  Allergen Reactions  . Sulfamethoxazole-Trimethoprim     REACTION: itching   Prior to Admission medications   Medication Sig Start Date End Date Taking? Authorizing Provider  albuterol (PROAIR HFA) 108 (90 BASE) MCG/ACT inhaler Inhale 2 puffs into the lungs every 4 (four) hours as needed. Use sparingly 07/10/13  Yes [name], MD  amitriptyline (ELAVIL) 10 MG tablet Take 1 tablet (10 mg total) by mouth at bedtime. 07/10/13  Yes [name], MD  atomoxetine (STRATTERA) 40 MG capsule Take 1 capsule (40 mg total) by mouth 2 (two) times daily. 07/10/13  Yes Spencer Copland, MD  baclofen (LIORESAL) 10 MG tablet Take 10 mg by mouth 2 (two) times daily as needed.   Yes Historical Provider, MD    beclomethasone (QVAR) 80 MCG/ACT inhaler Inhale 2 puffs into the lungs 2 (two) times daily. 07/10/13  Yes Spencer Copland, MD  fexofenadine (ALLEGRA) 180 MG tablet Take 1 tablet (180 mg total) by mouth daily. 07/10/13  Yes Spencer Copland, MD  FLUoxetine (PROZAC) 20 MG tablet Take 1 tablet (20 mg total) by mouth daily. 07/10/13  Yes Spencer Copland, MD  hyoscyamine (LEVSIN SL) 0.125 MG SL tablet Place 1 tablet (0.125 mg total) under the tongue every 4 (four) hours as needed. 07/10/13  Yes [name], MD  meloxicam (MOBIC) 15 MG tablet Take 1 tablet (15 mg total) by mouth daily. 09/24/13  Yes Spencer Copland, MD  montelukast (SINGULAIR) 10 MG tablet Take 1 tablet (10 mg total) by mouth daily. 07/10/13  Yes Spencer Copland, MD  ondansetron (ZOFRAN) 4 MG tablet Take 1 tablet (4 mg total) by mouth every 8 (eight) hours as needed for nausea or vomiting. 08/29/13  Yes Amy Michelle Nasuti, MD  tiZANidine (ZANAFLEX) 4 MG tablet Take 1 tablet (4 mg total) by mouth Nightly. 09/24/13  Yes Spencer Copland, MD  valACYclovir (VALTREX) 500 MG tablet Take 1 tablet (500 mg total) by mouth daily as needed. 07/10/13  Yes [name], MD        Review of Systems  HENT: Positive for sore throat.   Respiratory: Positive for cough.        Objective:   Physical Exam  Nursing note and vitals reviewed. Constitutional: She is oriented  to person, place, and time. She appears well-developed and well-nourished. No distress.  Awake, alert, nontoxic appearance  HENT:  Head: Normocephalic and atraumatic.  Right Ear: External ear normal.  Left Ear: External ear normal.  Mouth/Throat: Oropharynx is clear and moist. No oropharyngeal exudate.  Purulent discharge from nares  Eyes: Conjunctivae are normal. No scleral icterus.  Neck: Normal range of motion. Neck supple.  Cardiovascular: Normal rate, regular rhythm and normal heart sounds.   Pulmonary/Chest: Effort normal. No respiratory distress.  There is wheezing  bilaterally on forced expiration/no rales or rhonchi  Musculoskeletal: She exhibits no edema.  Lymphadenopathy:    She has no cervical adenopathy.  Neurological: She is alert and oriented to person, place, and time.     Skin: No rash noted.  Psychiatric: She has a normal mood and affect.   Filed Vitals:   10/10/13 1719  BP: 124/84  Pulse: 102  Temp: 98.7 F (37.1 C)  TempSrc: Oral  Resp: 18  Height: 5\' 1"  (1.549 m)  Weight: 201 lb (91.173 kg)  SpO2: 97%          Assessment & Plan:  6:22 PM- Discussed treatment plan with pt at bedside. Pt verbalized understanding and agreement with plan.  I personally performed the services described in this documentation, which was scribed in my presence. The recorded information has been reviewed and is accurate. Acute bronchospasm  Lower resp. tract infection  Cough  Meds ordered this encounter  Medications  . azithromycin (ZITHROMAX) 250 MG tablet    Sig: As packaged    Dispense:  6 tablet    Refill:  0  . HYDROcodone-homatropine (HYCODAN) 5-1.5 MG/5ML syrup    Sig: Take 5 mLs by mouth every 6 (six) hours as needed for cough.    Dispense:  120 mL    Refill:  0  . predniSONE (DELTASONE) 20 MG tablet    Sig: 3/3/2/2/1/1 single daily dose for 6 days    Dispense:  12 tablet    Refill:  0

## 2013-11-04 ENCOUNTER — Telehealth: Payer: Self-pay

## 2013-11-04 ENCOUNTER — Other Ambulatory Visit: Payer: Self-pay | Admitting: Family Medicine

## 2013-11-04 MED ORDER — LIDOCAINE-PRILOCAINE 2.5-2.5 % EX CREA: TOPICAL_CREAM | Freq: Four times a day (QID) | CUTANEOUS | Status: DC | PRN

## 2013-11-04 NOTE — Telephone Encounter (Signed)
Pt left v/m having vaginal herpes outbreak and pt request topical cream Dr Lorelei Pont gave pt in past that helps with the pain; pt cannot remember name of cream and said it has been at least 2 years since had cream. Pt request cream sent to [facility]. Pt request cb when called in.

## 2013-11-04 NOTE — Telephone Encounter (Signed)
done

## 2013-11-04 NOTE — Telephone Encounter (Signed)
Left message for Candice Mcgrath that prescription has been sent in to her pharmacy.

## 2014-01-26 ENCOUNTER — Ambulatory Visit (INDEPENDENT_AMBULATORY_CARE_PROVIDER_SITE_OTHER): Payer: BC Managed Care – PPO | Admitting: Family Medicine

## 2014-01-26 VITALS — BP 110/70 | HR 113 | Temp 98.6°F | Resp 18 | Ht 61.0 in | Wt 200.0 lb

## 2014-01-26 DIAGNOSIS — A088 Other specified intestinal infections: Secondary | ICD-10-CM

## 2014-01-26 DIAGNOSIS — A084 Viral intestinal infection, unspecified: Secondary | ICD-10-CM

## 2014-01-26 MED ORDER — ONDANSETRON HCL 4 MG PO TABS: 4.0000 mg | ORAL_TABLET | Freq: Three times a day (TID) | ORAL | Status: DC | PRN

## 2014-01-26 NOTE — Progress Notes (Signed)
Candice Mcgrath is a 38 y.o. female who presents to Urgent Care today with complaints of nausea/vomiting/diarrhea:  1.  N/V/Diarrhea:  Present x 2 days after trip to [place].  No sick contacts she knows of.  Some vomiting Sunday AM, still with some nausea but no further vomiting.  Diarrhea yesterday and today, watery.  No recent antibiotics.  No fevers or chills.  No abdominal pain. Eating and drinking well.    PMH reviewed.  Past Medical History  Diagnosis Date  . ETOH abuse     sober 2007, [facility]  . 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 Headache(784.0)   . IBS (irritable bowel syndrome)   . Allergic rhinitis   . Elevated BP   . Herpes   . Nephrolithiasis   . Hyperlipidemia   . Migraine headache 12/24/2012  . Pre-diabetes   . Obesity    Past Surgical History  Procedure Laterality Date  . Basket surgery  2005, 06    kidney stones    Medications reviewed. Current Outpatient Prescriptions  Medication Sig Dispense Refill  . albuterol (PROAIR HFA) 108 (90 BASE) MCG/ACT inhaler Inhale 2 puffs into the lungs every 4 (four) hours as needed. Use sparingly  1 Inhaler  4  . amitriptyline (ELAVIL) 10 MG tablet Take 1 tablet (10 mg total) by mouth at bedtime.  30 tablet  11  . atomoxetine (STRATTERA) 40 MG capsule Take 1 capsule (40 mg total) by mouth 2 (two) times daily.  60 capsule  11  . baclofen (LIORESAL) 10 MG tablet Take 10 mg by mouth 2 (two) times daily as needed.      . beclomethasone (QVAR) 80 MCG/ACT inhaler Inhale 2 puffs into the lungs 2 (two) times daily.  1 Inhaler  11  . chlorproMAZINE (THORAZINE) 25 MG tablet       . fexofenadine (ALLEGRA) 180 MG tablet Take 1 tablet (180 mg total) by mouth daily.  30 tablet  11  . FLUoxetine (PROZAC) 20 MG tablet Take 1 tablet (20 mg total) by mouth daily.  30 tablet  11  . fluticasone (FLONASE) 50 MCG/ACT nasal spray       . hyoscyamine (LEVSIN SL) 0.125 MG SL tablet Place 1 tablet (0.125 mg total) under the tongue  every 4 (four) hours as needed.  30 tablet  11  . lidocaine-prilocaine (EMLA) cream Apply topically 4 (four) times daily as needed. Four times a day to affected area, 1 month supply  30 g  2  . meloxicam (MOBIC) 15 MG tablet Take 1 tablet (15 mg total) by mouth daily.  30 tablet  2  . ondansetron (ZOFRAN) 4 MG tablet Take 1 tablet (4 mg total) by mouth every 8 (eight) hours as needed for nausea or vomiting.  20 tablet  0  . ORSYTHIA 0.1-20 MG-MCG tablet       . tiZANidine (ZANAFLEX) 4 MG tablet Take 1 tablet (4 mg total) by mouth Nightly.  30 tablet  2  . valACYclovir (VALTREX) 500 MG tablet Take 1 tablet (500 mg total) by mouth daily as needed.  30 tablet  11   No current facility-administered medications for this visit.    ROS as above otherwise neg.    Physical Exam:  BP 110/70  Pulse 113  Temp(Src) 98.6 F (37 C) (Oral)  Resp 18  Ht 5\' 1"  (1.549 m)  Wt 200 lb (90.719 kg)  BMI 37.81 kg/m2  SpO2 98% Gen:  Alert, cooperative patient who appears stated age in no acute distress.  Vital signs reviewed.  Not ill appearing.  HEENT: EOMI,  MMM.  No tonsillar exudates.   Pulm:  Clear to auscultation Cardiac:  Regular rate and rhythm  Abd:  Soft/nondistended/nontender.  Hyperactive BS.  No guarding or rebound.   Assessment and Plan:  1.  Viral gastro: - improving. - Still with diarrhea, very mild nausea.   - able to take PO -- encouraged continued fluids - zofran and immodium for relief.

## 2014-01-26 NOTE — Patient Instructions (Signed)
Keep drinking as much as possible to stay hydrated.  Gatorade and ginger ale can help.  Take the Immodium for diarrhea if you need it.  Take the Zofran for nausea if you need it.  If you're not better later this week, come back to see [place].  Don't wait if you start getting worse.

## 2014-01-30 ENCOUNTER — Other Ambulatory Visit: Payer: Self-pay | Admitting: Family Medicine

## 2014-01-30 NOTE — Telephone Encounter (Signed)
Last office visit 10/06/2013.  Ok to refill? 

## 2014-02-10 ENCOUNTER — Ambulatory Visit (INDEPENDENT_AMBULATORY_CARE_PROVIDER_SITE_OTHER): Payer: BC Managed Care – PPO | Admitting: Internal Medicine

## 2014-02-10 ENCOUNTER — Encounter: Payer: Self-pay | Admitting: Internal Medicine

## 2014-02-10 VITALS — BP 118/76 | HR 88 | Temp 97.6°F | Wt 202.0 lb

## 2014-02-10 DIAGNOSIS — J309 Allergic rhinitis, unspecified: Secondary | ICD-10-CM

## 2014-02-10 MED ORDER — METHYLPREDNISOLONE ACETATE 80 MG/ML IJ SUSP
80.0000 mg | Freq: Once | INTRAMUSCULAR | Status: AC
  Administered 2014-02-10: 80 mg via INTRAMUSCULAR

## 2014-02-10 NOTE — Addendum Note (Signed)
Addended by: Lurlean Nanny on: 02/10/2014 01:18 PM   Modules accepted: Orders

## 2014-02-10 NOTE — Progress Notes (Signed)
Pre visit review using our clinic review tool, if applicable. No additional management support is needed unless otherwise documented below in the visit note. 

## 2014-02-10 NOTE — Patient Instructions (Addendum)
Allergic Rhinitis Allergic rhinitis is when the mucous membranes in the nose respond to allergens. Allergens are particles in the air that cause your body to have an allergic reaction. This causes you to release allergic antibodies. Through a chain of events, these eventually cause you to release histamine into the blood stream. Although meant to protect the body, it is this release of histamine that causes your discomfort, such as frequent sneezing, congestion, and an itchy, runny nose.  CAUSES  Seasonal allergic rhinitis (hay fever) is caused by pollen allergens that may come from grasses, trees, and weeds. Year-round allergic rhinitis (perennial allergic rhinitis) is caused by allergens such as house dust mites, pet dander, and mold spores.  SYMPTOMS   Nasal stuffiness (congestion).  Itchy, runny nose with sneezing and tearing of the eyes. DIAGNOSIS  Your health care provider can help you determine the allergen or allergens that trigger your symptoms. If you and your health care provider are unable to determine the allergen, skin or blood testing may be used. TREATMENT  Allergic Rhinitis does not have a cure, but it can be controlled by:  Medicines and allergy shots (immunotherapy).  Avoiding the allergen. Hay fever may often be treated with antihistamines in pill or nasal spray forms. Antihistamines block the effects of histamine. There are over-the-counter medicines that may help with nasal congestion and swelling around the eyes. Check with your health care provider before taking or giving this medicine.  If avoiding the allergen or the medicine prescribed do not work, there are many new medicines your health care provider can prescribe. Stronger medicine may be used if initial measures are ineffective. Desensitizing injections can be used if medicine and avoidance does not work. Desensitization is when a patient is given ongoing shots until the body becomes less sensitive to the allergen.  Make sure you follow up with your health care provider if problems continue. HOME CARE INSTRUCTIONS It is not possible to completely avoid allergens, but you can reduce your symptoms by taking steps to limit your exposure to them. It helps to know exactly what you are allergic to so that you can avoid your specific triggers. SEEK MEDICAL CARE IF:   You have a fever.  You develop a cough that does not stop easily (persistent).  You have shortness of breath.  You start wheezing.  Symptoms interfere with normal daily activities. Document Released: 07/04/2001 Document Revised: 07/30/2013 Document Reviewed: 06/16/2013 [employer] Patient Information 2014 [employer].  

## 2014-02-10 NOTE — Progress Notes (Signed)
Subjective:    Patient ID: Candice Mcgrath, female    DOB: 12-Feb-1976, 38 y.o.   MRN: [id number]  HPI  Pt presents to the clinic today with c/o decreased hearing out of her right ear and wheezing. She noticed this 3 days ago after camping. She also notes itchy eyes, scratchy throat. She denies fever, chills or body aches. She has tried Sudafed, Human resources officer, flonase and her inhaler. 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 She has not had sick contacts.  Review of Systems      Past Medical History  Diagnosis Date  . ETOH abuse     sober 2007, [facility]  . 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 Headache(784.0)   . IBS (irritable bowel syndrome)   . Allergic rhinitis   . Elevated BP   . Herpes   . Nephrolithiasis   . Hyperlipidemia   . Migraine headache 12/24/2012  . Pre-diabetes   . Obesity     Current Outpatient Prescriptions  Medication Sig Dispense Refill  . albuterol (PROAIR HFA) 108 (90 BASE) MCG/ACT inhaler Inhale 2 puffs into the lungs every 4 (four) hours as needed. Use sparingly  1 Inhaler  4  . amitriptyline (ELAVIL) 10 MG tablet Take 1 tablet (10 mg total) by mouth at bedtime.  30 tablet  11  . atomoxetine (STRATTERA) 40 MG capsule Take 1 capsule (40 mg total) by mouth 2 (two) times daily.  60 capsule  11  . baclofen (LIORESAL) 10 MG tablet Take 10 mg by mouth 2 (two) times daily as needed.      . beclomethasone (QVAR) 80 MCG/ACT inhaler Inhale 2 puffs into the lungs 2 (two) times daily.  1 Inhaler  11  . chlorproMAZINE (THORAZINE) 25 MG tablet       . fexofenadine (ALLEGRA) 180 MG tablet Take 1 tablet (180 mg total) by mouth daily.  30 tablet  11  . FLUoxetine (PROZAC) 20 MG tablet Take 1 tablet (20 mg total) by mouth daily.  30 tablet  11  . fluticasone (FLONASE) 50 MCG/ACT nasal spray       . hyoscyamine (LEVSIN SL) 0.125 MG SL tablet Place 1 tablet (0.125 mg total) under the tongue every 4 (four) hours as needed.  30 tablet  11  .  lidocaine-prilocaine (EMLA) cream Apply topically 4 (four) times daily as needed. Four times a day to affected area, 1 month supply  30 g  2  . meloxicam (MOBIC) 15 MG tablet TAKE 1 TABLET (15 MG TOTAL) BY MOUTH DAILY.  30 tablet  2  . ondansetron (ZOFRAN) 4 MG tablet Take 1 tablet (4 mg total) by mouth every 8 (eight) hours as needed for nausea or vomiting.  20 tablet  0  . ORSYTHIA 0.1-20 MG-MCG tablet       . tiZANidine (ZANAFLEX) 4 MG tablet TAKE 1 TABLET BY MOUTH AT BEDTIME  30 tablet  2  . valACYclovir (VALTREX) 500 MG tablet Take 1 tablet (500 mg total) by mouth daily as needed.  30 tablet  11   No current facility-administered medications for this visit.    Allergies  Allergen Reactions  . Sulfamethoxazole-Trimethoprim     REACTION: itching    Family History  Problem Relation Age of Onset  . Alcohol abuse Other     GP  . Breast cancer Cousin   . Stroke Other     [place]  . Stroke Maternal Grandmother   .  Cancer Maternal Grandfather   . Heart disease Paternal Grandfather     History   Social History  . Marital Status: Single    Spouse Name: N/A    Number of Children: N/A  . Years of Education: N/A   Occupational History  . spanish     @ [employer]   Social History Main Topics  . Smoking status: Former Research scientist (life sciences)  . Smokeless tobacco: Former Systems developer    Quit date: 12/04/2008  . Alcohol Use: No     Comment: recovering AA  . Drug Use: No     Comment: Recovering NA  . Sexual Activity: Not on file   Other Topics Concern  . Not on file   Social History Narrative  . No narrative on file     Constitutional: Pt reports headache. Denies fever, malaise, fatigue, or abrupt weight changes.  HEENT: Pt reports difficulty hearing out of right ear. Denies eye pain, eye redness, ear pain, ringing in the ears, wax buildup, runny nose, nasal congestion, bloody nose, or sore throat. Respiratory: Pt reports wheezing. Denies difficulty breathing, shortness of breath,  cough or sputum production.      No other specific complaints in a complete review of systems (except as listed in HPI above).  Objective:   Physical Exam   BP 118/76  Pulse 88  Temp(Src) 97.6 F (36.4 C) (Oral)  Wt 202 lb (91.627 kg)  SpO2 99% Wt Readings from Last 3 Encounters:  02/10/14 202 lb (91.627 kg)  01/26/14 200 lb (90.719 kg)  10/10/13 201 lb (91.173 kg)    General: Appears her stated age, obese but well developed, well nourished in NAD. HEENT: Head: normal shape and size; Eyes: sclera white, no icterus, conjunctiva pink, PERRLA and EOMs intact; Ears: Tm's gray and intact, normal light reflex, + effusion on the right; Nose: mucosa boggy and moist, septum midline; Throat/Mouth: Teeth present, mucosa erythematous and moist, no exudate, lesions or ulcerations noted.  Cardiovascular: Normal rate and rhythm. S1,S2 noted.  No murmur, rubs or gallops noted. No JVD or BLE edema. No carotid bruits noted. Pulmonary/Chest: Normal effort and positive vesicular breath sounds. No respiratory distress. No wheezes, rales or ronchi noted.   BMET    Component Value Date/Time   NA 137 07/03/2013 0742   K 3.6 07/03/2013 0742   CL 111 07/03/2013 0742   CO2 19 07/03/2013 0742   GLUCOSE 148* 07/03/2013 0742   BUN 10 07/03/2013 0742   CREATININE 0.9 07/03/2013 0742   CALCIUM 8.6 07/03/2013 0742   GFRNONAA 77.07 11/08/2009 1035   GFRAA  Value: >60        The eGFR has been calculated using the MDRD equation. This calculation has not been validated in all clinical 03/24/2007 2140    Lipid Panel     Component Value Date/Time   CHOL 158 07/03/2013 0742   TRIG 121.0 07/03/2013 0742   HDL 45.20 07/03/2013 0742   CHOLHDL 3 07/03/2013 0742   VLDL 24.2 07/03/2013 0742   LDLCALC 89 07/03/2013 0742    CBC    Component Value Date/Time   WBC 11.1* 07/03/2013 0742   RBC 4.57 07/03/2013 0742   HGB 14.2 07/03/2013 0742   HCT 42.1 07/03/2013 0742   PLT 280.0 07/03/2013 0742   MCV 92.1 07/03/2013 0742   MCHC  33.7 07/03/2013 0742   RDW 13.0 07/03/2013 0742   LYMPHSABS 2.2 07/03/2013 0742   MONOABS 0.7 07/03/2013 0742   EOSABS 0.2 07/03/2013 8891  BASOSABS 0.0 07/03/2013 0742    Hgb A1C Lab Results  Component Value Date   HGBA1C 6.1 07/03/2013         Assessment & Plan:   Allergic Rhinitis:  Continue allegra, flonase and QVAR 80 mg Depo IM today Will hold off on pred taper for now  RTC as needed or if symptoms persist or worsen

## 2014-02-13 ENCOUNTER — Other Ambulatory Visit: Payer: Self-pay | Admitting: Internal Medicine

## 2014-02-13 ENCOUNTER — Telehealth: Payer: Self-pay

## 2014-02-13 MED ORDER — PREDNISONE 10 MG PO TABS: ORAL_TABLET | ORAL | Status: DC

## 2014-02-13 NOTE — Telephone Encounter (Signed)
Pt was seen 02/10/14 and cannot see any improvement since received injection on 02/10/14, pt wants to know if can get prednisone taper or what to do? Pt request cb. [facility].

## 2014-02-13 NOTE — Telephone Encounter (Signed)
Left detailed msg per HIPAA letting pt know Rx was sent to pharmacy

## 2014-02-13 NOTE — Telephone Encounter (Signed)
pred taper called in

## 2014-02-18 ENCOUNTER — Telehealth: Payer: Self-pay

## 2014-02-18 ENCOUNTER — Other Ambulatory Visit: Payer: Self-pay | Admitting: Family Medicine

## 2014-02-18 DIAGNOSIS — F32A Depression, unspecified: Secondary | ICD-10-CM

## 2014-02-18 DIAGNOSIS — F329 Major depressive disorder, single episode, unspecified: Secondary | ICD-10-CM

## 2014-02-18 MED ORDER — FLUOXETINE HCL 40 MG PO CAPS: 40.0000 mg | ORAL_CAPSULE | Freq: Every day | ORAL | Status: DC

## 2014-02-18 NOTE — Telephone Encounter (Signed)
Pt left v/m; pt feeling more depressed; pt wants to know if Dr Lorelei Pont would consider changing her med or do referral to psychiatrist. Unable to reach pt by phone.Please advise.

## 2014-02-18 NOTE — Telephone Encounter (Signed)
Left message for patient to return my call.

## 2014-02-18 NOTE — Telephone Encounter (Signed)
Patient notified as instructed by telephone.  Prescription for Prozac 40 mg sent to [facility].  Advised Rosaria Ferries or Vaughan Basta would be calling her with her appointment to the psychiatrist.

## 2014-02-18 NOTE — Telephone Encounter (Signed)
Increase prozac to 40 mg a day  (2 tabs a day)  Ok to call in prozac 40 mg for her, #30, 2 ref if needed  I think we should also get psychiatry involved. Our office will be calling her.

## 2014-05-09 ENCOUNTER — Other Ambulatory Visit: Payer: Self-pay | Admitting: Family Medicine

## 2014-05-09 NOTE — Telephone Encounter (Signed)
Last office visit 02/10/2014 with [name].  Last refilled 01/30/2014 for #30 with 2 refills.  Ok to refill?

## 2014-05-13 ENCOUNTER — Telehealth: Payer: Self-pay | Admitting: *Deleted

## 2014-05-13 NOTE — Telephone Encounter (Addendum)
Received [facility] from [facility] 40 mg.  PA completed on CoverMyMeds.  Authorization was approved effective 04/22/2014 through 05/13/2015.  Case ID: [id number].

## 2014-05-15 ENCOUNTER — Telehealth: Payer: Self-pay | Admitting: Family Medicine

## 2014-05-15 NOTE — Telephone Encounter (Signed)
I have been calling this patient since 02/18/14 and have left several messages about a Psychiatry appt. Would like to cancel this referral as it looks like the patient is not interested in going to see a specialist at this time. Is it ok to cancel this referral?

## 2014-05-15 NOTE — Telephone Encounter (Signed)
Do not cancel it yet, i am seeing her on Monday.

## 2014-05-18 ENCOUNTER — Ambulatory Visit (INDEPENDENT_AMBULATORY_CARE_PROVIDER_SITE_OTHER): Payer: BC Managed Care – PPO | Admitting: Family Medicine

## 2014-05-18 ENCOUNTER — Encounter: Payer: Self-pay | Admitting: Family Medicine

## 2014-05-18 VITALS — BP 120/88 | HR 92 | Temp 97.7°F | Ht 61.0 in | Wt 202.8 lb

## 2014-05-18 DIAGNOSIS — F3341 Major depressive disorder, recurrent, in partial remission: Secondary | ICD-10-CM | POA: Insufficient documentation

## 2014-05-18 DIAGNOSIS — IMO0002 Reserved for concepts with insufficient information to code with codable children: Secondary | ICD-10-CM

## 2014-05-18 NOTE — Telephone Encounter (Signed)
Please cancel referral

## 2014-05-18 NOTE — Progress Notes (Signed)
[place] [address] Phone: [phone] Fax: [phone]  Patient ID: [name] MRN: [id number], DOB: 15-Jun-1976, 38 y.o. Date of Encounter: 05/18/2014  Primary Physician:  Owens Loffler, MD   Chief Complaint: Medication Refill and Discuss Referral   Subjective:   History of Present Illness:  Candice Mcgrath is a 38 y.o. very pleasant female patient who presents with the following:  Stopped Prozac, will stop Elavil.  The patient stopped her Prozac in May, and she wants to go ahead and stop her amitriptyline. Little over 2 months ago, I talked to her about doing a psychiatry appointment because she was doing worse. Now she think she is doing a whole lot better, she has had some time off away from school.  She has had about a 30 pound weight gain, and also her libido has been influenced, she thinks secondary to medication.  Past Medical History, Surgical History, Social History, Family History, Problem List, Medications, and Allergies have been reviewed and updated if relevant.  Review of Systems:  GEN: No acute illnesses, no fevers, chills. GI: No n/v/d, eating normally Pulm: No SOB Interactive and getting along well at home.  Otherwise, ROS is as per the HPI.  Objective:   Physical Examination: BP 120/88  Pulse 92  Temp(Src) 97.7 F (36.5 C) (Oral)  Ht 5\' 1"  (1.549 m)  Wt 202 lb 12 oz (91.967 kg)  BMI 38.33 kg/m2  LMP 05/10/2014   GEN: WDWN, NAD, Non-toxic, A & O x 3 HEENT: Atraumatic, Normocephalic. Neck supple. No masses, No LAD. Ears and Nose: No external deformity. CV: RRR, No M/G/R. No JVD. No thrill. No extra heart sounds. PULM: CTA B, no wheezes, crackles, rhonchi. No retractions. No resp. distress. No accessory muscle use. EXTR: No c/c/e NEURO Normal gait.  PSYCH: Normally interactive. Conversant. Not depressed or anxious appearing.  Calm demeanor.   Laboratory and Imaging Data:  Assessment & Plan:   Major depressive  disorder, recurrent episode, in partial remission  Stop psych meds - we will see how she does off of medication.  Follow-up: No Follow-up on file. Unless noted above, the patient is to follow-up if symptoms worsen. Red flags were reviewed with the patient.  Signed,  Maud Deed. Aiman Noe, MD, [facility] Sports Medicine   Discontinued Medications   AMITRIPTYLINE (ELAVIL) 10 MG TABLET    Take 1 tablet (10 mg total) by mouth at bedtime.   FLUOXETINE (PROZAC) 40 MG CAPSULE    Take 1 capsule (40 mg total) by mouth daily.   ONDANSETRON (ZOFRAN) 4 MG TABLET    Take 1 tablet (4 mg total) by mouth every 8 (eight) hours as needed for nausea or vomiting.   PREDNISONE (DELTASONE) 10 MG TABLET    Take 3 tabs on days 1-2, take 2 tabs on days 3-4, take 1 tab on days 5-6   VALACYCLOVIR (VALTREX) 500 MG TABLET    Take 1 tablet (500 mg total) by mouth daily as needed.   Current Medications at Discharge:   Medication List       This list is accurate as of: 05/18/14  1:38 PM.  Always use your most recent med list.               albuterol 108 (90 BASE) MCG/ACT inhaler  Commonly known as:  PROAIR HFA  Inhale 2 puffs into the lungs every 4 (four) hours as needed. Use sparingly     atomoxetine 40 MG capsule  Commonly known  as:  STRATTERA  Take 1 capsule (40 mg total) by mouth 2 (two) times daily.     baclofen 10 MG tablet  Commonly known as:  LIORESAL  Take 10 mg by mouth 2 (two) times daily as needed.     beclomethasone 80 MCG/ACT inhaler  Commonly known as:  QVAR  Inhale 2 puffs into the lungs 2 (two) times daily.     chlorproMAZINE 25 MG tablet  Commonly known as:  THORAZINE     fexofenadine 180 MG tablet  Commonly known as:  ALLEGRA  Take 1 tablet (180 mg total) by mouth daily.     fluticasone 50 MCG/ACT nasal spray  Commonly known as:  FLONASE     hyoscyamine 0.125 MG SL tablet  Commonly known as:  LEVSIN SL  Place 1 tablet (0.125 mg total) under the tongue every 4 (four) hours as needed.      lidocaine-prilocaine cream  Commonly known as:  EMLA  Apply topically 4 (four) times daily as needed. Four times a day to affected area, 1 month supply     meloxicam 15 MG tablet  Commonly known as:  MOBIC  TAKE 1 TABLET (15 MG TOTAL) BY MOUTH DAILY.     ORSYTHIA 0.1-20 MG-MCG tablet  Generic drug:  levonorgestrel-ethinyl estradiol     tiZANidine 4 MG tablet  Commonly known as:  ZANAFLEX  TAKE 1 TABLET BY MOUTH AT BEDTIME

## 2014-05-18 NOTE — Progress Notes (Signed)
Pre visit review using our clinic review tool, if applicable. No additional management support is needed unless otherwise documented below in the visit note. 

## 2014-06-06 ENCOUNTER — Other Ambulatory Visit: Payer: Self-pay | Admitting: Family Medicine

## 2014-06-25 ENCOUNTER — Ambulatory Visit (INDEPENDENT_AMBULATORY_CARE_PROVIDER_SITE_OTHER): Payer: BC Managed Care – PPO | Admitting: Internal Medicine

## 2014-06-25 ENCOUNTER — Encounter: Payer: Self-pay | Admitting: Internal Medicine

## 2014-06-25 VITALS — BP 118/72 | HR 86 | Temp 97.8°F | Wt 201.0 lb

## 2014-06-25 DIAGNOSIS — R8290 Unspecified abnormal findings in urine: Secondary | ICD-10-CM

## 2014-06-25 DIAGNOSIS — R82998 Other abnormal findings in urine: Secondary | ICD-10-CM

## 2014-06-25 DIAGNOSIS — R3915 Urgency of urination: Secondary | ICD-10-CM

## 2014-06-25 DIAGNOSIS — R350 Frequency of micturition: Secondary | ICD-10-CM

## 2014-06-25 LAB — POCT URINALYSIS DIPSTICK
Bilirubin, UA: NEGATIVE
Glucose, UA: NEGATIVE
Ketones, UA: NEGATIVE
Leukocytes, UA: NEGATIVE
NITRITE UA: NEGATIVE
RBC UA: NEGATIVE
Spec Grav, UA: 1.02
UROBILINOGEN UA: NEGATIVE
pH, UA: 6

## 2014-06-25 NOTE — Progress Notes (Signed)
Pre visit review using our clinic review tool, if applicable. No additional management support is needed unless otherwise documented below in the visit note. 

## 2014-06-25 NOTE — Patient Instructions (Addendum)

## 2014-06-25 NOTE — Addendum Note (Signed)
Addended by: Lurlean Nanny on: 06/25/2014 09:50 AM   Modules accepted: Orders

## 2014-06-25 NOTE — Progress Notes (Signed)
HPI  Pt presents to the clinic today with c/o urinary frequency and strong odor. She reports this started 4-5 days ago. She has noticed some itching in the areas as well. She denies fever, chills or low back pain. She has not noticed any vaginal lesions. She has not tried anything OTC.   Review of Systems  Past Medical History  Diagnosis Date  . ETOH abuse     sober 2007, [facility]  . 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 Headache(784.0)   . IBS (irritable bowel syndrome)   . Allergic rhinitis   . Elevated BP   . Herpes   . Nephrolithiasis   . Hyperlipidemia   . Migraine headache 12/24/2012  . Pre-diabetes   . Obesity     Family History  Problem Relation Age of Onset  . Alcohol abuse Other     GP  . Breast cancer Cousin   . Stroke Other     [place]  . Stroke Maternal Grandmother   . Cancer Maternal Grandfather   . Heart disease Paternal Grandfather     History   Social History  . Marital Status: Single    Spouse Name: N/A    Number of Children: N/A  . Years of Education: N/A   Occupational History  . spanish     @ [employer]   Social History Main Topics  . Smoking status: Former Research scientist (life sciences)  . Smokeless tobacco: Former Systems developer    Quit date: 12/04/2008  . Alcohol Use: No     Comment: recovering AA  . Drug Use: No     Comment: Recovering NA  . Sexual Activity: Not on file   Other Topics Concern  . Not on file   Social History Narrative  . No narrative on file    Allergies  Allergen Reactions  . Sulfamethoxazole-Trimethoprim     REACTION: itching    Constitutional: Denies fever, malaise, fatigue, headache or abrupt weight changes.   GU: Pt reports urgency, frequency and strong odor to urine. Denies burning sensation, blood in urine, or discharge. Skin: Denies redness, rashes, lesions or ulcercations.   No other specific complaints in a complete review of systems (except as listed in HPI above).    Objective:   Physical  Exam  BP 118/72  Pulse 86  Temp(Src) 97.8 F (36.6 C) (Oral)  Wt 201 lb (91.173 kg)  LMP 06/03/2014  Wt Readings from Last 3 Encounters:  05/18/14 202 lb 12 oz (91.967 kg)  02/10/14 202 lb (91.627 kg)  01/26/14 200 lb (90.719 kg)    General: Appears her stated age, well developed, well nourished in NAD. Cardiovascular: Normal rate and rhythm. S1,S2 noted.  No murmur, rubs or gallops noted. No JVD or BLE edema. No carotid bruits noted. Pulmonary/Chest: Normal effort and positive vesicular breath sounds. No respiratory distress. No wheezes, rales or ronchi noted.  Abdomen: Soft and nontender. Normal bowel sounds, no bruits noted. No distention or masses noted. Liver, spleen and kidneys non palpable. No CVA tenderness.      Assessment & Plan:   Urgency, Frequency ans strong odor to urine:   Urinalysis: trace protien OK to take AZO OTC Drink plenty of fluids Work note provided  RTC as needed or if symptoms persist.

## 2014-07-05 ENCOUNTER — Other Ambulatory Visit: Payer: Self-pay | Admitting: Family Medicine

## 2014-08-26 ENCOUNTER — Other Ambulatory Visit: Payer: Self-pay | Admitting: Family Medicine

## 2014-08-26 NOTE — Telephone Encounter (Signed)
Last office visit 06/25/2014 with Candice Mcgrath.  Not on current medication list.   Ok to refill?

## 2014-08-26 NOTE — Telephone Encounter (Signed)
Last office visit 06/25/2014 with Candice Mcgrath.  Prozac not on current medication list.  Ok to refill?

## 2014-08-27 ENCOUNTER — Telehealth: Payer: Self-pay

## 2014-08-27 NOTE — Telephone Encounter (Signed)
Pt left v/m; pt is participating in a medical [facility] with [facility] in [place]; pt was advised to continue in this program pt will not be able to take amitriptyline. Lexapro and Zoloft are allowed. Pt wants to know if substitute med could be prescribed to take the place of Amitriptyline. [facility].pt request cb.

## 2014-08-28 NOTE — Telephone Encounter (Signed)
Left message for Annalea to call the office and schedule office visit with Dr. Lorelei Pont to discuss medical study and medications.

## 2014-08-28 NOTE — Telephone Encounter (Signed)
This is a complex question, and she is on a number of meds that need to be handled with care.   I would prefer for her to f/u routine OV to discuss

## 2014-09-01 ENCOUNTER — Other Ambulatory Visit: Payer: Self-pay | Admitting: Family Medicine

## 2014-09-01 NOTE — Telephone Encounter (Signed)
Last office visit 06/25/2014 with [name].  Last CPE 07/10/2013.  No record of recent pap smear.  Patient is seeing you tomorrow to discuss Medical Study and her medications.   Ok to refill?

## 2014-09-02 ENCOUNTER — Encounter: Payer: Self-pay | Admitting: Family Medicine

## 2014-09-02 ENCOUNTER — Ambulatory Visit (INDEPENDENT_AMBULATORY_CARE_PROVIDER_SITE_OTHER): Payer: BC Managed Care – PPO | Admitting: Family Medicine

## 2014-09-02 VITALS — BP 102/70 | HR 96 | Temp 98.0°F | Ht 61.0 in | Wt 203.5 lb

## 2014-09-02 DIAGNOSIS — F411 Generalized anxiety disorder: Secondary | ICD-10-CM | POA: Insufficient documentation

## 2014-09-02 DIAGNOSIS — F1921 Other psychoactive substance dependence, in remission: Secondary | ICD-10-CM

## 2014-09-02 DIAGNOSIS — G43709 Chronic migraine without aura, not intractable, without status migrainosus: Secondary | ICD-10-CM

## 2014-09-02 DIAGNOSIS — F3341 Major depressive disorder, recurrent, in partial remission: Secondary | ICD-10-CM

## 2014-09-02 MED ORDER — ESCITALOPRAM OXALATE 10 MG PO TABS: 10.0000 mg | ORAL_TABLET | Freq: Every day | ORAL | Status: DC

## 2014-09-02 NOTE — Progress Notes (Signed)
Pre visit review using our clinic review tool, if applicable. No additional management support is needed unless otherwise documented below in the visit note. 

## 2014-09-02 NOTE — Progress Notes (Signed)
Dr. Frederico Hamman T. Lota Leamer, MD, [facility] Medicine [place], [address] Phone: [phone] Fax: [phone]  09/02/2014  Patient: Candice Mcgrath, MRN: [id number], DOB: 12-Sep-1976, 38 y.o.  Primary Physician:  Owens Loffler, MD  Chief Complaint: Medication Management  Subjective:   Candice Mcgrath is a 38 y.o. very pleasant female patient who presents with the following:  D/c elavil, for sleep  More depression and anxiety: down, feeling anxiety and tension with someone at school.     Past Medical History, Surgical History, Social History, Family History, Problem List, Medications, and Allergies have been reviewed and updated if relevant.   GEN: No acute illnesses, no fevers, chills. GI: No n/v/d, eating normally Pulm: No SOB Interactive and getting along well at home.  Otherwise, ROS is as per the HPI.  Objective:   BP 102/70 mmHg  Pulse 96  Temp(Src) 98 F (36.7 C) (Oral)  Ht 5\' 1"  (1.549 m)  Wt 203 lb 8 oz (92.307 kg)  BMI 38.47 kg/m2  LMP 09/01/2014 (Exact Date)   GEN: WDWN, NAD, Non-toxic, Alert & Oriented x 3 HEENT: Atraumatic, Normocephalic.  Ears and Nose: No external deformity. EXTR: No clubbing/cyanosis/edema NEURO: Normal gait.  PSYCH: Normally interactive. Conversant. Not depressed or anxious appearing.  Calm demeanor.   Laboratory and Imaging Data:  Assessment and Plan:   Major depressive disorder, recurrent episode, in partial remission  Generalized anxiety disorder  Chronic migraine without aura without status migrainosus, not intractable  Personal history of drug dependence  >25 minutes spent in face to face time with patient, >50% spent in counselling or coordination of care: pleasant patient with a history of drug and alcohol dependence and significant migraines who has recently entered into a study. She has been taking Elavil for some time, and this is helped with her sleep, and we are  going to discontinue this because it is not allowed in her study. She has been having some increased depression and some increased anxiety, sore going to start her on Lexapro.  She did discontinue Prozac over the summer with 20 pound weight gain.  Follow-up: Return in about 6 weeks (around 10/14/2014).  New Prescriptions   ESCITALOPRAM (LEXAPRO) 10 MG TABLET    Take 1 tablet (10 mg total) by mouth daily.   No orders of the defined types were placed in this encounter.    Signed,  Maud Deed. Sayda Grable, MD   Patient's Medications  New Prescriptions   ESCITALOPRAM (LEXAPRO) 10 MG TABLET    Take 1 tablet (10 mg total) by mouth daily.  Previous Medications   ATOMOXETINE (STRATTERA) 40 MG CAPSULE    Take 1 capsule (40 mg total) by mouth 2 (two) times daily.   BACLOFEN (LIORESAL) 10 MG TABLET    Take 10 mg by mouth 2 (two) times daily as needed.   BECLOMETHASONE (QVAR) 80 MCG/ACT INHALER    Inhale 2 puffs into the lungs 2 (two) times daily.   CHLORPROMAZINE (THORAZINE) 25 MG TABLET       FEXOFENADINE (ALLEGRA) 180 MG TABLET    Take 1 tablet (180 mg total) by mouth daily.   FLUTICASONE (FLONASE) 50 MCG/ACT NASAL SPRAY       HYOSCYAMINE (LEVSIN SL) 0.125 MG SL TABLET    Place 1 tablet (0.125 mg total) under the tongue every 4 (four) hours as needed.   KETOROLAC (TORADOL) 10 MG TABLET       LEVONORGESTREL-ETHINYL ESTRADIOL (ORSYTHIA) 0.1-20 MG-MCG  TABLET    TAKE 1 TABLET BY MOUTH DAILY   LIDOCAINE-PRILOCAINE (EMLA) CREAM    Apply topically 4 (four) times daily as needed. Four times a day to affected area, 1 month supply   MELOXICAM (MOBIC) 15 MG TABLET    TAKE 1 TABLET (15 MG TOTAL) BY MOUTH DAILY.   PROAIR HFA 108 (90 BASE) MCG/ACT INHALER    INHALE 2 PUFFS INTO THE LUNGS EVERY 4 (FOUR) HOURS AS NEEDED. USE SPARINGLY   TIZANIDINE (ZANAFLEX) 4 MG TABLET    TAKE 1 TABLET BY MOUTH AT BEDTIME  Modified Medications   No medications on file  Discontinued Medications   AMITRIPTYLINE (ELAVIL) 10 MG  TABLET    TAKE 1 TABLET (10 MG TOTAL) BY MOUTH AT BEDTIME.   FLUOXETINE (PROZAC) 20 MG TABLET    TAKE 1 TABLET (20 MG TOTAL) BY MOUTH DAILY.

## 2014-09-08 ENCOUNTER — Telehealth: Payer: Self-pay | Admitting: Family Medicine

## 2014-09-08 NOTE — Telephone Encounter (Signed)
emmi emailed °

## 2014-09-24 ENCOUNTER — Other Ambulatory Visit: Payer: Self-pay | Admitting: Family Medicine

## 2014-09-24 NOTE — Telephone Encounter (Signed)
Last office visit 09/02/2014.  Last refilled 07/10/2013 for #60 with 11 refills.  Ok to refill?

## 2014-10-21 ENCOUNTER — Ambulatory Visit: Payer: BC Managed Care – PPO | Admitting: Family Medicine

## 2014-11-09 ENCOUNTER — Ambulatory Visit (INDEPENDENT_AMBULATORY_CARE_PROVIDER_SITE_OTHER): Payer: BC Managed Care – PPO | Admitting: Family Medicine

## 2014-11-09 ENCOUNTER — Encounter: Payer: Self-pay | Admitting: Family Medicine

## 2014-11-09 ENCOUNTER — Telehealth: Payer: Self-pay | Admitting: Family Medicine

## 2014-11-09 VITALS — BP 100/70 | HR 105 | Temp 98.2°F | Ht 61.0 in | Wt 198.8 lb

## 2014-11-09 DIAGNOSIS — L298 Other pruritus: Secondary | ICD-10-CM

## 2014-11-09 DIAGNOSIS — R21 Rash and other nonspecific skin eruption: Secondary | ICD-10-CM

## 2014-11-09 DIAGNOSIS — N898 Other specified noninflammatory disorders of vagina: Secondary | ICD-10-CM

## 2014-11-09 LAB — POCT WET PREP WITH KOH
Clue Cells Wet Prep HPF POC: NEGATIVE
KOH Prep POC: NEGATIVE
RBC Wet Prep HPF POC: NEGATIVE
Trichomonas, UA: NEGATIVE
YEAST WET PREP PER HPF POC: NEGATIVE

## 2014-11-09 MED ORDER — TRIAMCINOLONE ACETONIDE 0.1 % EX CREA: 1.0000 "application " | TOPICAL_CREAM | Freq: Two times a day (BID) | CUTANEOUS | Status: DC

## 2014-11-09 NOTE — Progress Notes (Signed)
Pre visit review using our clinic review tool, if applicable. No additional management support is needed unless otherwise documented below in the visit note. 

## 2014-11-09 NOTE — Progress Notes (Signed)
Dr. Frederico Hamman T. Nivek Powley, MD, [facility] [place], [address] Phone: [phone] Fax: [phone]  11/09/2014  Patient: Candice Mcgrath, MRN: [id number], DOB: 1976/04/18, 39 y.o.  Primary Physician:  Owens Loffler, MD  Chief Complaint: Rash and Vaginal Itching  Subjective:   Candice Mcgrath is a 39 y.o. very pleasant female patient who presents with the following:  Rash up under left arm? Mildly itchy.  She has not tried anything on this.  Yeast infection? No exposures.  Mild external itching.   Past Medical History, Surgical History, Social History, Family History, Problem List, Medications, and Allergies have been reviewed and updated if relevant.   GEN: No acute illnesses, no fevers, chills. GI: No n/v/d, eating normally Pulm: No SOB Interactive and getting along well at home.  Otherwise, ROS is as per the HPI.  Objective:   BP 100/70 mmHg  Pulse 105  Temp(Src) 98.2 F (36.8 C) (Oral)  Ht 5\' 1"  (1.549 m)  Wt 198 lb 12 oz (90.152 kg)  BMI 37.57 kg/m2  LMP 11/02/2014  GEN: WDWN, NAD, Non-toxic, A & O x 3 HEENT: Atraumatic, Normocephalic. Neck supple. No masses, No LAD. Ears and Nose: No external deformity. CV: RRR, No M/G/R. No JVD. No thrill. No extra heart sounds. PULM: CTA B, no wheezes, crackles, rhonchi. No retractions. No resp. distress. No accessory muscle use. GU: normal external appearance. This portion of the physical examination was chaperoned by Hedy Camara, CMA.  EXTR: No c/c/e NEURO Normal gait.  PSYCH: Normally interactive. Conversant. Not depressed or anxious appearing.  Calm demeanor.  Small rash, size of quarter.  Minimal scaling.  Laboratory and Imaging Data: Results for orders placed or performed in visit on 11/09/14  POCT Wet Prep with KOH  Result Value Ref Range   Trichomonas, UA Negative    Clue Cells Wet Prep HPF POC Negative    Epithelial Wet Prep HPF POC 10-20    Yeast  Wet Prep HPF POC Negative    Bacteria Wet Prep HPF POC 3+    RBC Wet Prep HPF POC Negative    WBC Wet Prep HPF POC 1-5    KOH Prep POC Negative      Assessment and Plan:   Rash  Vaginal itching - Plan: POCT Wet Prep with KOH  Reassured.  Wet prep is normal.  Small dermatitis LEFT axilla.  Corticosteroid cream.  Follow-up: No Follow-up on file.  New Prescriptions   TRIAMCINOLONE CREAM (KENALOG) 0.1 %    Apply 1 application topically 2 (two) times daily.   Orders Placed This Encounter  Procedures  . POCT Wet Prep with KOH    Signed,  Alyvia Derk T. Trang Bouse, MD   Patient's Medications  New Prescriptions   TRIAMCINOLONE CREAM (KENALOG) 0.1 %    Apply 1 application topically 2 (two) times daily.  Previous Medications   BACLOFEN (LIORESAL) 10 MG TABLET    Take 10 mg by mouth 2 (two) times daily as needed.   BECLOMETHASONE (QVAR) 80 MCG/ACT INHALER    Inhale 2 puffs into the lungs 2 (two) times daily.   CHLORPROMAZINE (THORAZINE) 25 MG TABLET       ESCITALOPRAM (LEXAPRO) 10 MG TABLET    Take 1 tablet (10 mg total) by mouth daily.   FEXOFENADINE (ALLEGRA) 180 MG TABLET    Take 1 tablet (180 mg total) by mouth daily.   FLUTICASONE (FLONASE) 50 MCG/ACT NASAL SPRAY  HYOSCYAMINE (LEVSIN SL) 0.125 MG SL TABLET    Place 1 tablet (0.125 mg total) under the tongue every 4 (four) hours as needed.   KETOROLAC (TORADOL) 10 MG TABLET       LEVONORGESTREL-ETHINYL ESTRADIOL (ORSYTHIA) 0.1-20 MG-MCG TABLET    TAKE 1 TABLET BY MOUTH DAILY   LIDOCAINE-PRILOCAINE (EMLA) CREAM    Apply topically 4 (four) times daily as needed. Four times a day to affected area, 1 month supply   MELOXICAM (MOBIC) 15 MG TABLET    TAKE 1 TABLET (15 MG TOTAL) BY MOUTH DAILY.   PROAIR HFA 108 (90 BASE) MCG/ACT INHALER    INHALE 2 PUFFS INTO THE LUNGS EVERY 4 (FOUR) HOURS AS NEEDED. USE SPARINGLY   STRATTERA 40 MG CAPSULE    TAKE 1 CAPSULE (40 MG TOTAL) BY MOUTH 2 (TWO) TIMES DAILY.   TIZANIDINE (ZANAFLEX) 4 MG  TABLET    TAKE 1 TABLET BY MOUTH AT BEDTIME  Modified Medications   No medications on file  Discontinued Medications   No medications on file

## 2014-11-09 NOTE — Telephone Encounter (Signed)
emmi emailed °

## 2014-12-10 ENCOUNTER — Telehealth: Payer: Self-pay

## 2014-12-10 NOTE — Telephone Encounter (Signed)
Candice Mcgrath with [employer] left v/m requesting cb [phone] x [phone] re; to status of fax sent on 10/26/14. Lattie Haw will send 2nd fax and Lattie Haw request cb with status of fax.

## 2014-12-10 NOTE — Telephone Encounter (Signed)
Left message for Candice Mcgrath that we have not received any forms on Candice Mcgrath.  Recommended that they refax forms to my direct fax at [phone].

## 2014-12-12 NOTE — Telephone Encounter (Signed)
noted 

## 2015-01-28 ENCOUNTER — Other Ambulatory Visit: Payer: Self-pay | Admitting: Family Medicine

## 2015-01-29 NOTE — Telephone Encounter (Signed)
Last office visit 11/09/2014.  Last refilled 11/04/2013 for 30 g with 2 refills.  Ok to refill?

## 2015-03-13 ENCOUNTER — Other Ambulatory Visit: Payer: Self-pay | Admitting: Family Medicine

## 2015-03-14 NOTE — Telephone Encounter (Signed)
Last office visit 11/09/2014.  Last refilled 05/10/2014 for #30 with 2 refills.  Ok to refill?

## 2015-03-20 ENCOUNTER — Other Ambulatory Visit: Payer: Self-pay | Admitting: Family Medicine

## 2015-03-30 ENCOUNTER — Other Ambulatory Visit: Payer: Self-pay | Admitting: Occupational Medicine

## 2015-03-30 ENCOUNTER — Ambulatory Visit: Payer: Self-pay

## 2015-03-30 DIAGNOSIS — M79672 Pain in left foot: Secondary | ICD-10-CM

## 2015-04-07 ENCOUNTER — Telehealth: Payer: Self-pay

## 2015-04-07 NOTE — Telephone Encounter (Signed)
Pt said her employer has updated list of immunizations and will ck with him; pt will also ck with travel advisory at health dept about what immunizations are needed for travel to [place]

## 2015-04-07 NOTE — Telephone Encounter (Signed)
Pt left v/m requesting if she had previously had Hep A vaccine; pt is traveling to [place] Left v/m for pt to cb. Do not see immunization record other than flu shots listed.

## 2015-04-27 ENCOUNTER — Other Ambulatory Visit: Payer: Self-pay | Admitting: Urology

## 2015-05-01 ENCOUNTER — Other Ambulatory Visit (HOSPITAL_COMMUNITY): Payer: Self-pay | Admitting: Nurse Practitioner

## 2015-05-01 ENCOUNTER — Ambulatory Visit (HOSPITAL_COMMUNITY)
Admission: RE | Admit: 2015-05-01 | Discharge: 2015-05-01 | Disposition: A | Discharge status: Home / Self Care | Payer: BC Managed Care – PPO | Source: Ambulatory Visit | Attending: Nurse Practitioner | Admitting: Nurse Practitioner

## 2015-05-01 ENCOUNTER — Ambulatory Visit (HOSPITAL_COMMUNITY)
Admission: RE | Admit: 2015-05-01 | Discharge: 2015-05-01 | Disposition: A | Discharge status: Home / Self Care | Payer: BC Managed Care – PPO | Source: Other Acute Inpatient Hospital | Attending: Nurse Practitioner | Admitting: Nurse Practitioner

## 2015-05-01 DIAGNOSIS — R1032 Left lower quadrant pain: Secondary | ICD-10-CM | POA: Diagnosis present

## 2015-05-01 DIAGNOSIS — R102 Pelvic and perineal pain: Secondary | ICD-10-CM

## 2015-05-03 ENCOUNTER — Encounter (HOSPITAL_COMMUNITY): Payer: Self-pay | Admitting: General Practice

## 2015-05-05 NOTE — H&P (Signed)
History of Present Illness She presents for a pre-procedural appointment for ESWL to treat a left renal calculus. She is scheduled for 07/14 to have the procedure. In the interim, she endorses intermittent flank pain that she manages with ketorolac. She denies any bothersome LUTS including gross hematuria or dysuria. She has been afebrile and without recent infection.   Past Medical History Problems  1. History of Anxiety (F41.9) 2. History of depression (Z86.59) 3. History of esophageal reflux (Z87.19) 4. History of hypercholesterolemia (Z86.39) 5. History of hypertension (Z86.79) 6. History of kidney stones (Z87.442) 7. History of kidney stones (Z87.442) 8. History of Urinary Calculus On The Left  Surgical History Problems  1. History of Cystoscopy With Ureteroscopy With Removal Of Calculus 2. History of Lithotripsy  Current Meds 1. Baclofen TABS;  Therapy: (Recorded:07Jul2016) to Recorded 2. ChlorproMAZINE HCl TABS;  Therapy: (Recorded:07Jul2016) to Recorded 3. Ketorolac Tromethamine 10 MG Oral Tablet; TAKE 1 TABLET EVERY 6 HOURS AS  NEEDED;  Therapy: 23Sep2015 to (Evaluate:20Apr2016)  Requested for: 15Apr2016; Last  Rx:15Apr2016 Ordered 4. Meloxicam TABS;  Therapy: (Recorded:23Sep2015) to Recorded 5. Microgestin FE 1/20 1-20 MG-MCG Oral Tablet;  Therapy: [id number] to Recorded 6. Potassium Citrate ER 10 MEQ (1080 MG) Oral Tablet Extended Release; TAKE 1 TABLET  3 TIMES A  DAY;  Therapy: 43PIR5188 to (Last CZ:[id number])  Requested for: 10XNA3557 Ordered 7. Strattera 40 MG Oral Capsule;  Therapy: 21Feb2011 to Recorded 8. TiZANidine HCl TABS;  Therapy: (Recorded:23Sep2015) to Recorded  Allergies Medication  1. Bactrim DS TABS  Family History Problems  1. Family history of Nephrolithiasis : Brother 2. Family history of Prostate Cancer  Social History Problems  1. Denied: Alcohol Use 2. Caffeine Use 3. Current every day smoker (F17.200) 4. Marital History -  Single  Review of Systems Genitourinary, constitutional, skin, eye, otolaryngeal, hematologic/lymphatic, cardiovascular, pulmonary, endocrine, musculoskeletal, gastrointestinal, neurological and psychiatric system(s) were reviewed and pertinent findings if present are noted and are otherwise negative.  Gastrointestinal: flank pain.    Vitals Vital Signs [Data Includes: Last 1 Day]  Recorded: 32KGU5427 03:26PM  Height: 5 ft 1 in Weight: 212 lb  BMI Calculated: 40.06 BSA Calculated: 1.94 Blood Pressure: 121 / 83 Temperature: 98 F Heart Rate: 87  Physical Exam Constitutional: Well nourished and well developed . No acute distress.  ENT:. The ears and nose are normal in appearance.  Neck: The appearance of the neck is normal and no neck mass is present.  Pulmonary: No respiratory distress and normal respiratory rhythm and effort.  Cardiovascular: Heart rate and rhythm are normal . No peripheral edema.  Abdomen: The abdomen is soft and nontender. No masses are palpated. No CVA tenderness. No hernias are palpable. No hepatosplenomegaly noted.  Genitourinary:  The bladder is non tender and not distended.  Lymphatics: The femoral and inguinal nodes are not enlarged or tender.  Skin: Normal skin turgor, no visible rash and no visible skin lesions.  Neuro/Psych:. Mood and affect are appropriate.    Results/Data Urine [Data Includes: Last 1 Day]   [id number]  COLOR YELLOW   APPEARANCE CLEAR   SPECIFIC GRAVITY 1.025   pH 6.0   GLUCOSE NEG mg/dL  BILIRUBIN NEG   KETONE NEG mg/dL  BLOOD MOD   PROTEIN TRACE mg/dL  UROBILINOGEN 0.2 mg/dL  NITRITE NEG   LEUKOCYTE ESTERASE NEG   SQUAMOUS EPITHELIAL/HPF MODERATE   WBC 3-6 WBC/hpf  RBC 7-10 RBC/hpf  BACTERIA MODERATE   CRYSTALS NONE SEEN   CASTS NONE SEEN   Other MUCUS NOTED  The following images/tracing/specimen were independently visualized:  KUB demonstrates an approximately 5.6 mm x 8.0 mm lithiasis projected over the left L3  transverse process. Previous CT imaging last year places this same lithiasis over in the left upper pole. It appears now it has migrated more into the renal pelvis. The expected tracks of the bilateral ureter remains clear. The bladder appears to be free of obstruction. I could not visualize any lithiasis in the right renal shadow.  The following clinical lab reports were reviewed:  UA with bacteria, 7-10 rbc/hpf and 3-6 wbc/hpf. Moderate amount of squamous cells noted. I will culture in spite of this given her procedure is next week.    Assessment Assessed  1. Pyuria (N39.0) 2. Nephrolithiasis (N20.0)  Plan Health Maintenance  1. UA With REFLEX; [Do Not Release]; Status:Complete;   Done: 16RCV8938 02:49PM Nephrolithiasis  2. Follow-up Schedule Surgery Office  Follow-up  Status: Hold For - Appointment   Requested for: [phone] 3. URINE PREGNANCY; Status:Complete;   Done: 85IDP8242 03:07PM Pyuria  4. URINE CULTURE; Status:In Progress - Specimen/Data Collected;   Done: 35TIR4431  Discussion/Summary We discussed shockwave lithotripsy in detail. We then addressed how stones that are larger, more dense, and in patients with less favorable anatomy have incrementally decreased success rates. We discussed risks including, bleeding, infection, hematoma, loss of kidney, need for staged therapy, need for adjunctive therapy and requirement to refrain from any anticoagulants, anti-platelet or aspirin-like products peri-procedureally. After careful consideration, the patient has chosen to continue with SWL. She understands to discontinue taking Ketorolac and other NSAID medications prior to her procedure as this will increase her risk of bleeding. We will f/u with her after her procedure to discuss plan of care moving in the further management of her stone disease. I will rx antibiotics based upon her culture results. She understands f/u in the presence of uncontrollable pain, fever, or other concerning  LUTS.    CC: Dr Janice Norrie   Signatures Electronically signed by : Jiles Crocker, ANP-C; Apr 29 2015  8:54PM EST

## 2015-05-06 ENCOUNTER — Ambulatory Visit (HOSPITAL_COMMUNITY)
Admission: RE | Admit: 2015-05-06 | Discharge: 2015-05-06 | Disposition: A | Discharge status: Home / Self Care | Payer: BC Managed Care – PPO | Source: Ambulatory Visit | Attending: Urology | Admitting: Urology

## 2015-05-06 ENCOUNTER — Ambulatory Visit (HOSPITAL_COMMUNITY): Payer: BC Managed Care – PPO

## 2015-05-06 ENCOUNTER — Encounter (HOSPITAL_COMMUNITY): Payer: Self-pay | Admitting: *Deleted

## 2015-05-06 ENCOUNTER — Encounter (HOSPITAL_COMMUNITY)
Admission: RE | Disposition: A | Discharge status: Home / Self Care | Payer: Self-pay | Source: Ambulatory Visit | Attending: Urology

## 2015-05-06 DIAGNOSIS — K219 Gastro-esophageal reflux disease without esophagitis: Secondary | ICD-10-CM | POA: Insufficient documentation

## 2015-05-06 DIAGNOSIS — Z87442 Personal history of urinary calculi: Secondary | ICD-10-CM | POA: Insufficient documentation

## 2015-05-06 DIAGNOSIS — N200 Calculus of kidney: Secondary | ICD-10-CM | POA: Diagnosis not present

## 2015-05-06 DIAGNOSIS — F419 Anxiety disorder, unspecified: Secondary | ICD-10-CM | POA: Diagnosis not present

## 2015-05-06 DIAGNOSIS — Z79899 Other long term (current) drug therapy: Secondary | ICD-10-CM | POA: Insufficient documentation

## 2015-05-06 DIAGNOSIS — F329 Major depressive disorder, single episode, unspecified: Secondary | ICD-10-CM | POA: Diagnosis not present

## 2015-05-06 DIAGNOSIS — E780 Pure hypercholesterolemia: Secondary | ICD-10-CM | POA: Diagnosis not present

## 2015-05-06 DIAGNOSIS — I10 Essential (primary) hypertension: Secondary | ICD-10-CM | POA: Insufficient documentation

## 2015-05-06 LAB — PREGNANCY, URINE: Preg Test, Ur: NEGATIVE

## 2015-05-06 SURGERY — LITHOTRIPSY, ESWL
Anesthesia: LOCAL | Laterality: Left

## 2015-05-06 MED ORDER — DIPHENHYDRAMINE HCL 25 MG PO CAPS
25.0000 mg | ORAL_CAPSULE | ORAL | Status: AC
  Administered 2015-05-06: 25 mg via ORAL
  Filled 2015-05-06: qty 1

## 2015-05-06 MED ORDER — DIAZEPAM 5 MG PO TABS
10.0000 mg | ORAL_TABLET | ORAL | Status: AC
  Administered 2015-05-06: 10 mg via ORAL
  Filled 2015-05-06: qty 2

## 2015-05-06 MED ORDER — SODIUM CHLORIDE 0.9 % IV SOLN
INTRAVENOUS | Status: DC
  Administered 2015-05-06: 11:00:00 via INTRAVENOUS

## 2015-05-06 MED ORDER — CIPROFLOXACIN HCL 500 MG PO TABS
500.0000 mg | ORAL_TABLET | ORAL | Status: AC
  Administered 2015-05-06: 500 mg via ORAL
  Filled 2015-05-06: qty 1

## 2015-05-06 NOTE — Progress Notes (Signed)
Returned form ESWL Left groin area with softball sized area , pink.

## 2015-05-06 NOTE — Interval H&P Note (Signed)
History and Physical Interval Note:  05/06/2015 12:00 PM  Candice Mcgrath  has presented today for surgery, with the diagnosis of RENAL CALCULI  The various methods of treatment have been discussed with the patient and family. After consideration of risks, benefits and other options for treatment, the patient has consented to  Procedure(s): LEFT EXTRACORPOREAL SHOCK WAVE LITHOTRIPSY (ESWL) (Left) as a surgical intervention .  The patient's history has been reviewed, patient examined, no change in status, stable for surgery.  I have reviewed the patient's chart and labs.  Questions were answered to the patient's satisfaction.     Kjirsten Bloodgood,LES

## 2015-05-06 NOTE — Interval H&P Note (Signed)
History and Physical Interval Note:  05/06/2015 12:27 PM  Candice Mcgrath  has presented today for surgery, with the diagnosis of RENAL CALCULI  The various methods of treatment have been discussed with the patient and family. After consideration of risks, benefits and other options for treatment, the patient has consented to  Procedure(s): LEFT EXTRACORPOREAL SHOCK WAVE LITHOTRIPSY (ESWL) (Left) as a surgical intervention .  The patient's history has been reviewed, patient examined, no change in status, stable for surgery.  I have reviewed the patient's chart and labs.  Questions were answered to the patient's satisfaction.     Kaisha Wachob,LES

## 2015-05-06 NOTE — Op Note (Signed)
Please see scanned chart for planned operative note for ESWL.

## 2015-05-06 NOTE — Discharge Instructions (Signed)
1. You should strain your urine and collect all fragments and bring them to your follow up appointment.  °2. You should take your pain medication as needed.  Please call if your pain is severe to the point that it is not controlled with your pain medication. °3. You should call if you develop fever > 101 or persistent nausea or vomiting. °4. Your doctor may prescribe tamsulosin to take to help facilitate stone passage. °

## 2015-05-31 ENCOUNTER — Ambulatory Visit (INDEPENDENT_AMBULATORY_CARE_PROVIDER_SITE_OTHER): Payer: BC Managed Care – PPO | Admitting: Family Medicine

## 2015-05-31 ENCOUNTER — Encounter: Payer: Self-pay | Admitting: Family Medicine

## 2015-05-31 VITALS — BP 106/80 | HR 92 | Temp 97.8°F | Wt 207.0 lb

## 2015-05-31 DIAGNOSIS — L0292 Furuncle, unspecified: Secondary | ICD-10-CM | POA: Diagnosis not present

## 2015-05-31 DIAGNOSIS — Z3169 Encounter for other general counseling and advice on procreation: Secondary | ICD-10-CM | POA: Diagnosis not present

## 2015-05-31 NOTE — Progress Notes (Signed)
Pre visit review using our clinic review tool, if applicable. No additional management support is needed unless otherwise documented below in the visit note. 

## 2015-05-31 NOTE — Progress Notes (Signed)
Dr. Frederico Hamman T. Tykerria Mccubbins, MD, [facility] [place], [address] Phone: [phone] Fax: [phone]  05/31/2015  Patient: Candice Mcgrath, MRN: [id number], DOB: Jan 18, 1976, 39 y.o.  Primary Physician:  Owens Loffler, MD  Chief Complaint: Wants to discuss getting pregnant and ingrown hair  Subjective:   Candice Mcgrath is a 39 y.o. very pleasant female patient who presents with the following:  Got married in June.   Had surgery in July.   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   Ingrown hair, anterior, lower abdomen. Has been there 2 months, now with a bump  Past Medical History, Surgical History, Social History, Family History, Problem List, Medications, and Allergies have been reviewed and updated if relevant.  Patient Active Problem List   Diagnosis Date Noted  . Generalized anxiety disorder 09/02/2014    Priority: Medium  . Major depressive disorder, recurrent episode, in partial remission 05/18/2014    Priority: Medium  . Personal history of drug dependence 12/24/2012    Priority: Medium  . Migraine headache 12/24/2012    Priority: Medium  . ASTHMA 06/23/2010    Priority: Medium  . Personal history of alcoholism 10/30/2008    Priority: Medium  . SCIATICA 11/02/2010  . IBS 12/13/2009  . HYPERLIPIDEMIA 11/08/2009  . ADD 11/08/2009  . ALLERGIC RHINITIS 11/01/2008  . NEPHROLITHIASIS, HX OF 11/01/2008  . GENITAL HERPES 10/30/2008    Past Medical History  Diagnosis Date  . ETOH abuse     sober 2007, [facility]  . 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 Headache(784.0)   . IBS (irritable bowel syndrome)   . Allergic rhinitis   . Elevated BP   . Herpes   . Hyperlipidemia   . Migraine headache 12/24/2012  . Pre-diabetes   . Obesity   . Nephrolithiasis   . Kidney stones     Past Surgical History  Procedure Laterality Date  . Basket surgery  2005, 06    kidney stones  .  Tympanostomy tube placement Bilateral     as a child    History   Social History  . Marital Status: Married    Spouse Name: N/A  . Number of Children: N/A  . Years of Education: N/A   Occupational History  . spanish     @ [employer]   Social History Main Topics  . Smoking status: Current Every Day Smoker  . Smokeless tobacco: Former Systems developer    Quit date: 12/04/2008  . Alcohol Use: 1.2 - 2.4 oz/week    0 Standard drinks or equivalent, 2-4 Cans of beer per week     Comment: recovering AA  . Drug Use: No     Comment: Recovering NA  . Sexual Activity: Not on file   Other Topics Concern  . Not on file   Social History Narrative    Family History  Problem Relation Age of Onset  . Alcohol abuse Other     GP  . Breast cancer Cousin   . Stroke Other     [place]  . Stroke Maternal Grandmother   . Cancer Maternal Grandfather   . Heart disease Paternal Grandfather     Allergies  Allergen Reactions  . Sulfamethoxazole-Trimethoprim     REACTION: itching    Medication list reviewed and updated in full in [facility].   GEN: No acute illnesses, no fevers, chills. GI: No n/v/d, eating  normally Pulm: No SOB Interactive and getting along well at home.  Otherwise, ROS is as per the HPI.  Objective:   BP 106/80 mmHg  Pulse 92  Temp(Src) 97.8 F (36.6 C) (Oral)  Wt 207 lb (93.895 kg)  LMP 05/30/2015 (Exact Date)  GEN: WDWN, NAD, Non-toxic, A & O x 3 HEENT: Atraumatic, Normocephalic. Neck supple. No masses, No LAD. Ears and Nose: No external deformity. CV: RRR, No M/G/R. No JVD. No thrill. No extra heart sounds. PULM: CTA B, no wheezes, crackles, rhonchi. No retractions. No resp. distress. No accessory muscle use. EXTR: No c/c/e NEURO Normal gait.  PSYCH: Normally interactive. Conversant. Not depressed or anxious appearing.  Calm demeanor.  Chaperone, [employer], CMA Anterior GU, small palpable healed boil, mildly hard  Laboratory and Imaging  Data:  Assessment and Plan:   Boil  Pre-conception counseling  >15 minutes spent in face to face time with patient, >50% spent in counselling or coordination of care: she is Arty stopped all of her medication, and we will have to follow her in this regards.  My primary concern would be her depression, but she stopped her antidepressants about 2 months ago.  She seems to be doing relatively well.  She is stopped everything else as well, and I reviewed with her some basic prepregnancy discussion such as getting prenatal vitamins and if she has any kind of crisis at all, she can call me.  I reassured her about the boil, this is just scar tissue at this point and will likely fade over months to years.  Signed,  Maud Deed. Bunny Kleist, MD   Patient's Medications  New Prescriptions   No medications on file  Previous Medications   BACLOFEN (LIORESAL) 10 MG TABLET    Take 10 mg by mouth 2 (two) times daily as needed for muscle spasms.    BECLOMETHASONE (QVAR) 80 MCG/ACT INHALER    Inhale 2 puffs into the lungs 2 (two) times daily.   CHLORPROMAZINE (THORAZINE) 25 MG TABLET    Take 25-50 mg by mouth as needed (for headaches, takes twice a month).    ESCITALOPRAM (LEXAPRO) 10 MG TABLET    TAKE 1 TABLET BY MOUTH EVERY DAY   FEXOFENADINE (ALLEGRA) 180 MG TABLET    Take 1 tablet (180 mg total) by mouth daily.   FLUTICASONE (FLONASE) 50 MCG/ACT NASAL SPRAY    Place 1 spray into both nostrils daily.    HYOSCYAMINE (LEVSIN SL) 0.125 MG SL TABLET    Place 1 tablet (0.125 mg total) under the tongue every 4 (four) hours as needed.   LEVONORGESTREL-ETHINYL ESTRADIOL (ORSYTHIA) 0.1-20 MG-MCG TABLET    TAKE 1 TABLET BY MOUTH DAILY   LIDOCAINE-PRILOCAINE (EMLA) CREAM    APPLY TOPICALLY 4 (FOUR) TIMES DAILY AS NEEDED. FOUR TIMES A DAY TO AFFECTED AREA, 1 MONTH SUPPLY   PROAIR HFA 108 (90 BASE) MCG/ACT INHALER    INHALE 2 PUFFS INTO THE LUNGS EVERY 4 (FOUR) HOURS AS NEEDED. USE SPARINGLY   STRATTERA 40 MG CAPSULE     TAKE 1 CAPSULE (40 MG TOTAL) BY MOUTH 2 (TWO) TIMES DAILY.   TIZANIDINE (ZANAFLEX) 4 MG TABLET    TAKE 1 TABLET BY MOUTH AT BEDTIME   TRIAMCINOLONE CREAM (KENALOG) 0.1 %    Apply 1 application topically 2 (two) times daily.  Modified Medications   No medications on file  Discontinued Medications   HYDROCODONE-ACETAMINOPHEN (NORCO/VICODIN) 5-325 MG PER TABLET    Take 1 tablet by mouth every 6 (six)  hours as needed for moderate pain.   PROMETHAZINE (PHENERGAN) 12.5 MG TABLET    Take 12.5 mg by mouth every 6 (six) hours as needed for nausea or vomiting.

## 2015-06-08 ENCOUNTER — Other Ambulatory Visit: Payer: Self-pay | Admitting: Obstetrics and Gynecology

## 2015-06-09 LAB — CYTOLOGY - PAP

## 2015-09-01 ENCOUNTER — Other Ambulatory Visit: Payer: Self-pay | Admitting: Family Medicine

## 2015-09-01 NOTE — Telephone Encounter (Signed)
Last office visit 05/31/2015.  Last refilled 03/15/2015 for #30 with 2 refills.  Ok to refill?

## 2015-09-24 ENCOUNTER — Telehealth: Payer: Self-pay | Admitting: *Deleted

## 2015-09-24 MED ORDER — ATOMOXETINE HCL 40 MG PO CAPS: ORAL_CAPSULE | ORAL | Status: DC

## 2015-09-24 NOTE — Telephone Encounter (Signed)
i saw Candice Mcgrath a few months ago. I am completely fine if she does not take her Strattera every day. I routinely tell all of my adult ADD patients to not take their medication if they feel they don't need it and try drug holidays.   Please refill Stattera #30, 3 refills

## 2015-09-24 NOTE — Telephone Encounter (Addendum)
PA for Skipper Cliche completed on CoverMyMeds and was approved effective 09/03/2015 through 09/23/2016.  Case ID: [id number].  New prescription called into [facility] as well as the PA approval notification.

## 2015-09-24 NOTE — Telephone Encounter (Signed)
Received fax from [facility] requesting PA for Strattera.  According to patient's chart last prescription was written on 09/27/2014 for #30 with 5 refills.  Called and spoke with pharmacist to see who wrote the last prescription since our records showed she should of been out of medication refills in June.  Per Pharmacy she brought in the prescription written on 09/27/2014 on 03/21/2015 and just requested the first refill now.  Advised pharmacy that patient needs to be seen prior to refilling this medication since she is apparently not taking it like she should.  Will forward to Dr. Lorelei Pont to make sure he is aware and agrees.

## 2015-10-07 ENCOUNTER — Telehealth: Payer: Self-pay

## 2015-10-07 NOTE — Telephone Encounter (Signed)
Left message for patient to return my call for flu shot.

## 2015-10-13 NOTE — Telephone Encounter (Signed)
Pt left v/m requesting PA and refills for Statterra. Left detailed v/m per DPR that refills and PA was done 09/24/15. Pt will cb if needed.

## 2015-12-02 ENCOUNTER — Other Ambulatory Visit: Payer: Self-pay

## 2015-12-02 MED ORDER — ALBUTEROL SULFATE HFA 108 (90 BASE) MCG/ACT IN AERS: INHALATION_SPRAY | RESPIRATORY_TRACT | Status: DC

## 2015-12-02 NOTE — Telephone Encounter (Signed)
Debbie from [facility] left v/m requesting refill proair. Pt last annual exam on 07/10/2013. Pt has CPX scheduled on 03/06/16.Please advise.

## 2015-12-06 ENCOUNTER — Ambulatory Visit (INDEPENDENT_AMBULATORY_CARE_PROVIDER_SITE_OTHER): Payer: BC Managed Care – PPO | Admitting: Internal Medicine

## 2015-12-06 ENCOUNTER — Telehealth: Payer: Self-pay | Admitting: Family Medicine

## 2015-12-06 ENCOUNTER — Encounter: Payer: Self-pay | Admitting: Family Medicine

## 2015-12-06 ENCOUNTER — Other Ambulatory Visit: Payer: Self-pay | Admitting: Internal Medicine

## 2015-12-06 ENCOUNTER — Encounter: Payer: Self-pay | Admitting: Internal Medicine

## 2015-12-06 VITALS — BP 112/60 | HR 108 | Temp 98.3°F | Wt 207.8 lb

## 2015-12-06 DIAGNOSIS — A09 Infectious gastroenteritis and colitis, unspecified: Secondary | ICD-10-CM | POA: Diagnosis not present

## 2015-12-06 DIAGNOSIS — R197 Diarrhea, unspecified: Secondary | ICD-10-CM

## 2015-12-06 DIAGNOSIS — J00 Acute nasopharyngitis [common cold]: Secondary | ICD-10-CM | POA: Diagnosis not present

## 2015-12-06 DIAGNOSIS — K589 Irritable bowel syndrome without diarrhea: Secondary | ICD-10-CM | POA: Diagnosis not present

## 2015-12-06 NOTE — Progress Notes (Signed)
Chief Complaint  Patient presents with  . Diarrhea    bloody yesterday    HPI: Patient Candice Mcgrath  comes in today for SDA for  new problem evaluation. 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 [place] [employer]   Onset 4 days ago  lik eallergy attack and then yesterday had diarrhea loose watery with cramps  X 10  And then developed blood i n mucous around it   .  Had cramps . Mucousy. Early on watery     Then blood at end. br blood   In mucous    Today  Diarrhea . 3=4 x but no blood Diarrhea " Diaper light light brown" .  Urgency .  Cramps a little  not as bad.    Has ibs   .  ? If virus.   Plus  ibs sx    Baseline ibs   Constipation alternating diarrhea    Not usually this loose .   No recenet antibiotic use  ROS: See pertinent positives and negatives per HPI. No fever some chills  No rash  Congested  Upper  .   Past Medical History  Diagnosis Date  . ETOH abuse     sober 2007, [facility]  . 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 Headache(784.0)   . IBS (irritable bowel syndrome)   . Allergic rhinitis   . Elevated BP   . Herpes   . Hyperlipidemia   . Migraine headache 12/24/2012  . Pre-diabetes   . Obesity   . Nephrolithiasis   . Kidney stones     Family History  Problem Relation Age of Onset  . Alcohol abuse Other     GP  . Breast cancer Cousin   . Stroke Other     [place]  . Stroke Maternal Grandmother   . Cancer Maternal Grandfather   . Heart disease Paternal Grandfather     Social History   Social History  . Marital Status: Married    Spouse Name: N/A  . Number of Children: N/A  . Years of Education: N/A   Occupational History  . spanish     @ [employer]   Social History Main Topics  . Smoking status: Current Every Day Smoker  . Smokeless tobacco: Former Systems developer    Quit date: 12/04/2008  . Alcohol Use: 1.2 - 2.4 oz/week    0 Standard drinks  or equivalent, 2-4 Cans of beer per week     Comment: recovering AA  . Drug Use: No     Comment: Recovering NA  . Sexual Activity: Not on file   Other Topics Concern  . Not on file   Social History Narrative    Outpatient Prescriptions Prior to Visit  Medication Sig Dispense Refill  . albuterol (PROAIR HFA) 108 (90 Base) MCG/ACT inhaler INHALE 2 PUFFS INTO THE LUNGS EVERY 4 (FOUR) HOURS AS NEEDED. USE SPARINGLY 8.5 each 4  . atomoxetine (STRATTERA) 40 MG capsule TAKE 1 CAPSULE (40 MG TOTAL) BY MOUTH 2 (TWO) TIMES DAILY. 60 capsule 3  . baclofen (LIORESAL) 10 MG tablet Take 10 mg by mouth 2 (two) times daily as needed for muscle spasms.     . beclomethasone (QVAR) 80 MCG/ACT inhaler Inhale 2 puffs into the lungs 2 (two) times daily. (Patient not taking: Reported on 05/31/2015) 1 Inhaler 11  .  chlorproMAZINE (THORAZINE) 25 MG tablet Take 25-50 mg by mouth as needed (for headaches, takes twice a month).     . escitalopram (LEXAPRO) 10 MG tablet TAKE 1 TABLET BY MOUTH EVERY DAY (Patient not taking: Reported on 05/31/2015) 30 tablet 5  . fexofenadine (ALLEGRA) 180 MG tablet Take 1 tablet (180 mg total) by mouth daily. (Patient not taking: Reported on 05/31/2015) 30 tablet 11  . fluticasone (FLONASE) 50 MCG/ACT nasal spray Place 1 spray into both nostrils daily.     . hyoscyamine (LEVSIN SL) 0.125 MG SL tablet Place 1 tablet (0.125 mg total) under the tongue every 4 (four) hours as needed. (Patient not taking: Reported on 05/31/2015) 30 tablet 11  . levonorgestrel-ethinyl estradiol (ORSYTHIA) 0.1-20 MG-MCG tablet TAKE 1 TABLET BY MOUTH DAILY (Patient not taking: Reported on 05/31/2015) 28 tablet 5  . lidocaine-prilocaine (EMLA) cream APPLY TOPICALLY 4 (FOUR) TIMES DAILY AS NEEDED. FOUR TIMES A DAY TO AFFECTED AREA, 1 MONTH SUPPLY (Patient not taking: Reported on 05/31/2015) 30 g 2  . tiZANidine (ZANAFLEX) 4 MG tablet TAKE 1 TABLET BY MOUTH AT BEDTIME 30 tablet 2  . triamcinolone cream (KENALOG) 0.1 % Apply 1  application topically 2 (two) times daily. 454 g 0   No facility-administered medications prior to visit.     EXAM:  BP 112/60 mmHg  Pulse 108  Temp(Src) 98.3 F (36.8 C) (Oral)  Wt 207 lb 12.8 oz (94.257 kg)  SpO2 97%  Body mass index is 39.28 kg/(m^2).  GENERAL: vitals reviewed and listed above, alert, oriented, appears well hydrated and in no acute distress nasal congested   HEENT: atraumatic, conjunctiva  clear, no obvious abnormalities on inspection of external nose and ears OP : no lesion edema or exudate  NECK: no obvious masses on inspection palpation  LUNGS: clear to auscultation bilaterally, no wheezes, rales or rhonchi, good air movement CV: HRRR, no clubbing cyanosis or  peripheral edema nl cap refill  Abdomen:  Sof,t normal bowel sounds without hepatosplenomegaly, no guarding rebound or masses no CVA tenderness MS: moves all extremities without noticeable focal  abnormality PSYCH: pleasant and cooperative, no obvious depression or anxiety   ASSESSMENT AND PLAN:  Discussed the following assessment and plan:  Diarrhea, unspecified type - Plan: Stool culture, Fecal lactoferrin, Giardia/cryptosporidium (EIA)  IBS (irritable bowel syndrome)  Bloody diarrhea - transient yesterday  better today sounds like enteritis  but not dysentery . get stool cx sx rx for now   Coryza Total visit 71mins > 50% spent counseling and coordinating care as indicated in above note and in instructions to patient .  dsic alarm sx and note for work  Reassuring exam  -Patient advised to return or notify health care team  if symptoms worsen ,persist or new concerns arise.  Patient Instructions  This is probably a bowel  Infection  And the blood may have been from  The 10 diarrheal stools.    Stool test  For culture .Marland Kitchen Can use immodium with care  If needed for comfort  coantc [place] if fever or blood comes  Back     Diarrhea Diarrhea is frequent loose and watery bowel movements. It can  cause you to feel weak and dehydrated. Dehydration can cause you to become tired and thirsty, have a dry mouth, and have decreased urination that often is dark yellow. Diarrhea is a sign of another problem, most often an infection that will not last long. In most cases, diarrhea typically lasts 2-3 days. However, it can  last longer if it is a sign of something more serious. It is important to treat your diarrhea as directed by your caregiver to lessen or prevent future episodes of diarrhea. CAUSES  Some common causes include:  Gastrointestinal infections caused by viruses, bacteria, or parasites.  Food poisoning or food allergies.  Certain medicines, such as antibiotics, chemotherapy, and laxatives.  Artificial sweeteners and fructose.  Digestive disorders. HOME CARE INSTRUCTIONS  Ensure adequate fluid intake (hydration): Have 1 cup (8 oz) of fluid for each diarrhea episode. Avoid fluids that contain simple sugars or sports drinks, fruit juices, whole milk products, and sodas. Your urine should be clear or pale yellow if you are drinking enough fluids. Hydrate with an oral rehydration solution that you can purchase at pharmacies, retail stores, and online. You can prepare an oral rehydration solution at home by mixing the following ingredients together:   - tsp table salt.   tsp baking soda.   tsp salt substitute containing potassium chloride.  1  tablespoons sugar.  1 L (34 oz) of water.  Certain foods and beverages may increase the speed at which food moves through the gastrointestinal (GI) tract. These foods and beverages should be avoided and include:  Caffeinated and alcoholic beverages.  High-fiber foods, such as raw fruits and vegetables, nuts, seeds, and whole grain breads and cereals.  Foods and beverages sweetened with sugar alcohols, such as xylitol, sorbitol, and mannitol.  Some foods may be well tolerated and may help thicken stool including:  Starchy foods, such as  rice, toast, pasta, low-sugar cereal, oatmeal, grits, baked potatoes, crackers, and bagels.  Bananas.  Applesauce.  Add probiotic-rich foods to help increase healthy bacteria in the GI tract, such as yogurt and fermented milk products.  Wash your hands well after each diarrhea episode.  Only take over-the-counter or prescription medicines as directed by your caregiver.  Take a warm bath to relieve any burning or pain from frequent diarrhea episodes. SEEK IMMEDIATE MEDICAL CARE IF:   You are unable to keep fluids down.  You have persistent vomiting.  You have blood in your stool, or your stools are black and tarry.  You do not urinate in 6-8 hours, or there is only a small amount of very dark urine.  You have abdominal pain that increases or localizes.  You have weakness, dizziness, confusion, or light-headedness.  You have a severe headache.  Your diarrhea gets worse or does not get better.  You have a fever or persistent symptoms for more than 2-3 days.  You have a fever and your symptoms suddenly get worse. MAKE SURE YOU:   Understand these instructions.  Will watch your condition.  Will get help right away if you are not doing well or get worse.   This information is not intended to replace advice given to you by your health care provider. Make sure you discuss any questions you have with your health care provider.   Document Released: 09/29/2002 Document Revised: 10/30/2014 Document Reviewed: 06/16/2012 Elsevier Interactive Patient Education 2016 Elsevier Inc. Bloody Diarrhea Bloody diarrhea can be caused by many different conditions. Most of the time bloody diarrhea is the result of food poisoning or minor infections. Bloody diarrhea usually improves over 2 to 3 days of rest and fluid replacement. Other conditions that can cause bloody diarrhea include:  Internal bleeding.  Infection.  Diseases of the bowel and colon. Internal bleeding from an ulcer or  bowel disease can be severe and requires hospital care or even surgery.  DIAGNOSIS  To find out what is wrong your caregiver may check your:  Stool.  Blood.  Results from a test that looks inside the body (endoscopy). TREATMENT   Get plenty of rest.  Drink enough water and fluids to keep your urine clear or pale yellow.  Do not smoke.  Solid foods and dairy products should be avoided until your illness improves.  As you improve, slowly return to a regular diet with easily-digested foods first. Examples are:  Bananas.  Rice.  Toast.  Crackers. You should only need these for about 2 days before adding more normal foods to your diet.  Avoid spicy or fatty foods as well as caffeine and alcohol for several days.  Medicine to control cramping and diarrhea can relieve symptoms but may prolong some cases of bloody diarrhea. Antibiotics can speed recovery from diarrhea due to some bacterial infections. Call your caregiver if diarrhea does not get better in 3 days. SEEK MEDICAL CARE IF:   You do not improve after 3 days.  Your diarrhea improves but your stool appears black. SEEK IMMEDIATE MEDICAL CARE IF:   You become extremely weak or faint.  You become very sweaty.  You have increased pain or bleeding.  You develop repeated vomiting.  You vomit and you see blood or the vomit looks black in color.  You have a fever.   This information is not intended to replace advice given to you by your health care provider. Make sure you discuss any questions you have with your health care provider.   Document Released: 10/09/2005 Document Revised: 10/30/2014 Document Reviewed: 09/10/2009 Elsevier Interactive Patient Education [employer]. back         [place]. Panosh M.D.

## 2015-12-06 NOTE — Telephone Encounter (Signed)
Patient Name: Candice Mcgrath  DOB: [birth year]    Initial Comment Caller states, diarrhea for a few days, there was blood in it yesterday, today the blood has gone    Nurse Assessment  Nurse: Orvan Seen, RN, Jacquilin Date/Time (Eastern Time): 12/06/2015 12:46:32 PM  Confirm and document reason for call. If symptomatic, describe symptoms. You must click the next button to save text entered. ---Caller states, diarrhea for a few days, there was blood in it yesterday, today the blood has gone  Has the patient traveled out of the country within the last 30 days? ---No  Does the patient have any new or worsening symptoms? ---Yes  Will a triage be completed? ---Yes  Related visit to physician within the last 2 weeks? ---No  Does the PT have any chronic conditions? (i.e. diabetes, asthma, etc.) ---Yes  List chronic conditions. ---ibs  Is the patient pregnant or possibly pregnant? (Ask all females between the ages of 75-55) ---No  Is this a behavioral health or substance abuse call? ---No     Guidelines    Guideline Title Affirmed Question Affirmed Notes  Diarrhea [1] SEVERE diarrhea (e.g., 7 or more times / day more than normal) AND [2] present > 24 hours (1 day)    Final Disposition User   See Physician within 24 Hours Atkins, RN, Jacquilin    Comments  Checked with PCP but he was booked and out of the office tomorrow. Patient requested to be seen at [facility]. Made appointment with Dr. Regis Bill at 1545   Referrals  REFERRED TO PCP OFFICE   Disagree/Comply: Comply

## 2015-12-06 NOTE — Patient Instructions (Addendum)
This is probably a bowel  Infection  And the blood may have been from  The 10 diarrheal stools.    Stool test  For culture .Marland Kitchen Can use immodium with care  If needed for comfort  coantc [place] if fever or blood comes  Back     Diarrhea Diarrhea is frequent loose and watery bowel movements. It can cause you to feel weak and dehydrated. Dehydration can cause you to become tired and thirsty, have a dry mouth, and have decreased urination that often is dark yellow. Diarrhea is a sign of another problem, most often an infection that will not last long. In most cases, diarrhea typically lasts 2-3 days. However, it can last longer if it is a sign of something more serious. It is important to treat your diarrhea as directed by your caregiver to lessen or prevent future episodes of diarrhea. CAUSES  Some common causes include:  Gastrointestinal infections caused by viruses, bacteria, or parasites.  Food poisoning or food allergies.  Certain medicines, such as antibiotics, chemotherapy, and laxatives.  Artificial sweeteners and fructose.  Digestive disorders. HOME CARE INSTRUCTIONS  Ensure adequate fluid intake (hydration): Have 1 cup (8 oz) of fluid for each diarrhea episode. Avoid fluids that contain simple sugars or sports drinks, fruit juices, whole milk products, and sodas. Your urine should be clear or pale yellow if you are drinking enough fluids. Hydrate with an oral rehydration solution that you can purchase at pharmacies, retail stores, and online. You can prepare an oral rehydration solution at home by mixing the following ingredients together:   - tsp table salt.   tsp baking soda.   tsp salt substitute containing potassium chloride.  1  tablespoons sugar.  1 L (34 oz) of water.  Certain foods and beverages may increase the speed at which food moves through the gastrointestinal (GI) tract. These foods and beverages should be avoided and include:  Caffeinated and alcoholic  beverages.  High-fiber foods, such as raw fruits and vegetables, nuts, seeds, and whole grain breads and cereals.  Foods and beverages sweetened with sugar alcohols, such as xylitol, sorbitol, and mannitol.  Some foods may be well tolerated and may help thicken stool including:  Starchy foods, such as rice, toast, pasta, low-sugar cereal, oatmeal, grits, baked potatoes, crackers, and bagels.  Bananas.  Applesauce.  Add probiotic-rich foods to help increase healthy bacteria in the GI tract, such as yogurt and fermented milk products.  Wash your hands well after each diarrhea episode.  Only take over-the-counter or prescription medicines as directed by your caregiver.  Take a warm bath to relieve any burning or pain from frequent diarrhea episodes. SEEK IMMEDIATE MEDICAL CARE IF:   You are unable to keep fluids down.  You have persistent vomiting.  You have blood in your stool, or your stools are black and tarry.  You do not urinate in 6-8 hours, or there is only a small amount of very dark urine.  You have abdominal pain that increases or localizes.  You have weakness, dizziness, confusion, or light-headedness.  You have a severe headache.  Your diarrhea gets worse or does not get better.  You have a fever or persistent symptoms for more than 2-3 days.  You have a fever and your symptoms suddenly get worse. MAKE SURE YOU:   Understand these instructions.  Will watch your condition.  Will get help right away if you are not doing well or get worse.   This information is not intended to  replace advice given to you by your health care provider. Make sure you discuss any questions you have with your health care provider.   Document Released: 09/29/2002 Document Revised: 10/30/2014 Document Reviewed: 06/16/2012 Elsevier Interactive Patient Education 2016 Elsevier Inc. Bloody Diarrhea Bloody diarrhea can be caused by many different conditions. Most of the time bloody  diarrhea is the result of food poisoning or minor infections. Bloody diarrhea usually improves over 2 to 3 days of rest and fluid replacement. Other conditions that can cause bloody diarrhea include:  Internal bleeding.  Infection.  Diseases of the bowel and colon. Internal bleeding from an ulcer or bowel disease can be severe and requires hospital care or even surgery. DIAGNOSIS  To find out what is wrong your caregiver may check your:  Stool.  Blood.  Results from a test that looks inside the body (endoscopy). TREATMENT   Get plenty of rest.  Drink enough water and fluids to keep your urine clear or pale yellow.  Do not smoke.  Solid foods and dairy products should be avoided until your illness improves.  As you improve, slowly return to a regular diet with easily-digested foods first. Examples are:  Bananas.  Rice.  Toast.  Crackers. You should only need these for about 2 days before adding more normal foods to your diet.  Avoid spicy or fatty foods as well as caffeine and alcohol for several days.  Medicine to control cramping and diarrhea can relieve symptoms but may prolong some cases of bloody diarrhea. Antibiotics can speed recovery from diarrhea due to some bacterial infections. Call your caregiver if diarrhea does not get better in 3 days. SEEK MEDICAL CARE IF:   You do not improve after 3 days.  Your diarrhea improves but your stool appears black. SEEK IMMEDIATE MEDICAL CARE IF:   You become extremely weak or faint.  You become very sweaty.  You have increased pain or bleeding.  You develop repeated vomiting.  You vomit and you see blood or the vomit looks black in color.  You have a fever.   This information is not intended to replace advice given to you by your health care provider. Make sure you discuss any questions you have with your health care provider.   Document Released: 10/09/2005 Document Revised: 10/30/2014 Document Reviewed:  09/10/2009 Elsevier Interactive Patient Education [employer]. back

## 2015-12-06 NOTE — Telephone Encounter (Signed)
Pt has appt 12/06/15 at 3:45 with Dr Regis Bill.

## 2015-12-08 LAB — FECAL LACTOFERRIN, QUANT: Lactoferrin: POSITIVE

## 2015-12-11 LAB — STOOL CULTURE

## 2015-12-16 LAB — GIARDIA/CRYPTOSPORIDIUM (EIA)

## 2015-12-19 ENCOUNTER — Other Ambulatory Visit: Payer: Self-pay | Admitting: Family Medicine

## 2015-12-24 ENCOUNTER — Telehealth: Payer: Self-pay | Admitting: Family Medicine

## 2015-12-24 NOTE — Telephone Encounter (Signed)
lacatferrin  Is a marker for WBC or whit blood cells that can be present with inflammation or infection.   If she is all   better  No concern . I having  Continue diarrhea or  Gi sx blood etc   Then should make appt  fu with Dr Lorelei Pont

## 2015-12-24 NOTE — Telephone Encounter (Signed)
Pt seen she has positive lactoferrin.  Wants to know if she needs to be concerned.  What does this mean?  Okay to leave a message if she does not pick up.

## 2015-12-27 NOTE — Telephone Encounter (Signed)
Left message informing her of WP note.  Advised she follow up with PCP if not better.

## 2016-03-02 ENCOUNTER — Other Ambulatory Visit: Payer: Self-pay | Admitting: Family Medicine

## 2016-03-03 NOTE — Telephone Encounter (Signed)
Last office visit 05/31/2015.  Last refilled 09/01/2015 for #30 with 2 refills. CPE 03/06/2016.  Ok to refill?

## 2016-03-06 ENCOUNTER — Ambulatory Visit (INDEPENDENT_AMBULATORY_CARE_PROVIDER_SITE_OTHER): Payer: BC Managed Care – PPO | Admitting: Family Medicine

## 2016-03-06 ENCOUNTER — Encounter: Payer: Self-pay | Admitting: Family Medicine

## 2016-03-06 VITALS — BP 100/74 | HR 92 | Temp 98.0°F | Ht 61.5 in | Wt 212.2 lb

## 2016-03-06 DIAGNOSIS — Z0000 Encounter for general adult medical examination without abnormal findings: Secondary | ICD-10-CM

## 2016-03-06 MED ORDER — HYOSCYAMINE SULFATE 0.125 MG SL SUBL: 0.1250 mg | SUBLINGUAL_TABLET | SUBLINGUAL | Status: DC | PRN

## 2016-03-06 MED ORDER — LIDOCAINE-PRILOCAINE 2.5-2.5 % EX CREA: TOPICAL_CREAM | CUTANEOUS | Status: DC

## 2016-03-06 MED ORDER — BECLOMETHASONE DIPROPIONATE 80 MCG/ACT IN AERS: INHALATION_SPRAY | RESPIRATORY_TRACT | Status: DC

## 2016-03-06 MED ORDER — ALBUTEROL SULFATE HFA 108 (90 BASE) MCG/ACT IN AERS: INHALATION_SPRAY | RESPIRATORY_TRACT | Status: DC

## 2016-03-06 MED ORDER — FLUTICASONE PROPIONATE 50 MCG/ACT NA SUSP: 1.0000 | Freq: Every day | NASAL | Status: DC

## 2016-03-06 MED ORDER — ATOMOXETINE HCL 40 MG PO CAPS: ORAL_CAPSULE | ORAL | Status: DC

## 2016-03-06 NOTE — Progress Notes (Signed)
Pre visit review using our clinic review tool, if applicable. No additional management support is needed unless otherwise documented below in the visit note. 

## 2016-03-06 NOTE — Progress Notes (Signed)
Dr. Frederico Hamman T. Ameet Sandy, MD, [facility] [place], [address] Phone: [phone] Fax: [phone]  03/06/2016  Patient: Candice Mcgrath, MRN: [id number], DOB: 1975-11-03, 40 y.o.  Primary Physician:  Owens Loffler, MD   Chief Complaint  Patient presents with  . Annual Exam   Subjective:   Candice Mcgrath is a 40 y.o. pleasant patient who presents with the following:  Health Maintenance Summary Reviewed and updated, unless pt declines services.  Tobacco History Reviewed. Non-smoker Alcohol: RECOVERY, NA AND AA, BUT NOW DRINKING SOME, 2-3 BEERS DAILY Exercise Habits: Some activity, rec at least 30 mins 5 times a week STD concerns: none Drug Use: None Birth control method: Menses regular: yes Lumps or breast concerns: no Breast Cancer Family History: no  Health Maintenance  Topic Date Due  . INFLUENZA VACCINE  05/23/2016  . TETANUS/TDAP  10/23/2017  . PAP SMEAR  06/07/2018  . HIV Screening  Completed    Immunization History  Administered Date(s) Administered  . Influenza, Seasonal, Injecte, Preservative Fre 09/02/2014  . Influenza,inj,Quad PF,36+ Mos 07/10/2013   Patient Active Problem List   Diagnosis Date Noted  . Generalized anxiety disorder 09/02/2014    Priority: Medium  . Major depressive disorder, recurrent episode, in partial remission ([place]) 05/18/2014    Priority: Medium  . Personal history of drug dependence ([place]) 12/24/2012    Priority: Medium  . Migraine headache 12/24/2012    Priority: Medium  . ASTHMA 06/23/2010    Priority: Medium  . Personal history of alcoholism ([place]) 10/30/2008    Priority: Medium  . SCIATICA 11/02/2010  . IBS 12/13/2009  . HYPERLIPIDEMIA 11/08/2009  . ADD 11/08/2009  . ALLERGIC RHINITIS 11/01/2008  . NEPHROLITHIASIS, HX OF 11/01/2008  . GENITAL HERPES 10/30/2008   Past Medical History  Diagnosis Date  . ETOH abuse     [facility]  . 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 Headache(784.0)   . IBS (irritable bowel syndrome)   . Allergic rhinitis   . Elevated BP   . Herpes   . Hyperlipidemia   . Migraine headache 12/24/2012  . Pre-diabetes   . Obesity   . Nephrolithiasis   . Kidney stones    Past Surgical History  Procedure Laterality Date  . Basket surgery  2005, 06    kidney stones  . Tympanostomy tube placement Bilateral     as a child   Social History   Social History  . Marital Status: Married    Spouse Name: N/A  . Number of Children: N/A  . Years of Education: N/A   Occupational History  . spanish     @ [employer]   Social History Main Topics  . Smoking status: Current Every Day Smoker  . Smokeless tobacco: Former Systems developer    Quit date: 12/04/2008  . Alcohol Use: 1.2 - 2.4 oz/week    0 Standard drinks or equivalent, 2-4 Cans of beer per week     Comment: recovering AA  . Drug Use: No     Comment: Recovering NA  . Sexual Activity: Not on file   Other Topics Concern  . Not on file   Social History Narrative   Family History  Problem Relation Age of Onset  . Alcohol abuse Other     GP  . Breast cancer Cousin   . Stroke Other     [place]  .  Stroke Maternal Grandmother   . Cancer Maternal Grandfather   . Heart disease Paternal Grandfather    Allergies  Allergen Reactions  . Sulfamethoxazole-Trimethoprim     REACTION: itching    Medication list has been reviewed and updated.   General: Denies fever, chills, sweats. No significant weight loss. Eyes: Denies blurring,significant itching ENT: Denies earache, sore throat, and hoarseness.  Cardiovascular: Denies chest pains, palpitations, dyspnea on exertion,  Respiratory: Denies cough, dyspnea at rest,wheeezing Breast: no concerns about lumps GI: Denies nausea, vomiting, diarrhea, constipation, change in bowel habits, abdominal pain, melena, hematochezia GU: Denies dysuria, hematuria, urinary hesitancy,  nocturia, denies STD risk, no concerns about discharge Musculoskeletal: Denies back pain, joint pain Derm: Denies rash, itching Neuro: Denies  paresthesias, frequent falls, frequent headaches Psych: Denies depression, anxiety. DRINKING 2-3 BEERS NIGHTLY NOW Endocrine: Denies cold intolerance, heat intolerance, polydipsia Heme: Denies enlarged lymph nodes Allergy: No hayfever  Objective:   BP 100/74 mmHg  Pulse 92  Temp(Src) 98 F (36.7 C) (Oral)  Ht 5' 1.5" (1.562 m)  Wt 212 lb 4 oz (96.276 kg)  BMI 39.46 kg/m2  LMP 02/20/2016 No exam data present  GEN: well developed, well nourished, no acute distress Eyes: conjunctiva and lids normal, PERRLA, EOMI ENT: TM clear, nares clear, oral exam WNL Neck: supple, no lymphadenopathy, no thyromegaly, no JVD Pulm: clear to auscultation and percussion, respiratory effort normal CV: regular rate and rhythm, S1-S2, no murmur, rub or gallop, no bruits Chest: no scars, masses, no lumps BREAST: breast exam declined GI: soft, non-tender; no hepatosplenomegaly, masses; active bowel sounds all quadrants GU: GU exam declined Lymph: no cervical, axillary or inguinal adenopathy MSK: gait normal, muscle tone and strength WNL, no joint swelling, effusions, discoloration, crepitus  SKIN: clear, good turgor, color WNL, no rashes, lesions, or ulcerations Neuro: normal mental status, normal strength, sensation, and motion Psych: alert; oriented to person, place and time, normally interactive and not anxious or depressed in appearance.   All labs reviewed with patient. We will obtain records from the patient's prior physicians.   Assessment and Plan:   Healthcare maintenance  Health Maintenance Exam: The patient's preventative maintenance and recommended screening tests for an annual wellness exam were reviewed in full today. Brought up to date unless services declined.  Counselled on the importance of diet, exercise, and its role in overall health  and mortality. The patient's FH and SH was reviewed, including their home life, tobacco status, and drug and alcohol status.  Counselled about ETOH - suggested calling friend from [place] Overall doing well otherwise  Follow-up: No Follow-up on file. Or follow-up in 1 year for complete physical examination  New Prescriptions   No medications on file   Modified Medications   Modified Medication Previous Medication   ALBUTEROL (PROAIR HFA) 108 (90 BASE) MCG/ACT INHALER albuterol (PROAIR HFA) 108 (90 Base) MCG/ACT inhaler      INHALE 2 PUFFS INTO THE LUNGS EVERY 4 (FOUR) HOURS AS NEEDED. USE SPARINGLY    INHALE 2 PUFFS INTO THE LUNGS EVERY 4 (FOUR) HOURS AS NEEDED. USE SPARINGLY   ATOMOXETINE (STRATTERA) 40 MG CAPSULE atomoxetine (STRATTERA) 40 MG capsule      TAKE 1 CAPSULE (40 MG TOTAL) BY MOUTH 2 (TWO) TIMES DAILY.    TAKE 1 CAPSULE (40 MG TOTAL) BY MOUTH 2 (TWO) TIMES DAILY.   BECLOMETHASONE (QVAR) 80 MCG/ACT INHALER QVAR 80 MCG/ACT inhaler      INHALE 2 PUFFS INTO THE LUNGS 2 (TWO) TIMES DAILY.  INHALE 2 PUFFS INTO THE LUNGS 2 (TWO) TIMES DAILY.   FLUTICASONE (FLONASE) 50 MCG/ACT NASAL SPRAY fluticasone (FLONASE) 50 MCG/ACT nasal spray      Place 1 spray into both nostrils daily.    Place 1 spray into both nostrils daily.    HYOSCYAMINE (LEVSIN SL) 0.125 MG SL TABLET hyoscyamine (LEVSIN SL) 0.125 MG SL tablet      Place 1 tablet (0.125 mg total) under the tongue every 4 (four) hours as needed.    Place 1 tablet (0.125 mg total) under the tongue every 4 (four) hours as needed.   LIDOCAINE-PRILOCAINE (EMLA) CREAM lidocaine-prilocaine (EMLA) cream      APPLY TOPICALLY 4 (FOUR) TIMES DAILY AS NEEDED. FOUR TIMES A DAY TO AFFECTED AREA, 1 MONTH SUPPLY    APPLY TOPICALLY 4 (FOUR) TIMES DAILY AS NEEDED. FOUR TIMES A DAY TO AFFECTED AREA, 1 MONTH SUPPLY   No orders of the defined types were placed in this encounter.    Signed,  Maud Deed. Kashis Penley, MD   Patient's Medications  New  Prescriptions   No medications on file  Previous Medications   AZELASTINE (ASTELIN) 0.1 % NASAL SPRAY    Place 1 spray into both nostrils 2 (two) times daily. Use in each nostril as directed   LEVOCETIRIZINE (XYZAL) 5 MG TABLET    TK 1 T PO QD IN THE EVE   TIZANIDINE (ZANAFLEX) 4 MG TABLET    TAKE 1 TABLET BY MOUTH AT BEDTIME   TRIAMCINOLONE CREAM (KENALOG) 0.1 %    Apply 1 application topically 2 (two) times daily.  Modified Medications   Modified Medication Previous Medication   ALBUTEROL (PROAIR HFA) 108 (90 BASE) MCG/ACT INHALER albuterol (PROAIR HFA) 108 (90 Base) MCG/ACT inhaler      INHALE 2 PUFFS INTO THE LUNGS EVERY 4 (FOUR) HOURS AS NEEDED. USE SPARINGLY    INHALE 2 PUFFS INTO THE LUNGS EVERY 4 (FOUR) HOURS AS NEEDED. USE SPARINGLY   ATOMOXETINE (STRATTERA) 40 MG CAPSULE atomoxetine (STRATTERA) 40 MG capsule      TAKE 1 CAPSULE (40 MG TOTAL) BY MOUTH 2 (TWO) TIMES DAILY.    TAKE 1 CAPSULE (40 MG TOTAL) BY MOUTH 2 (TWO) TIMES DAILY.   BECLOMETHASONE (QVAR) 80 MCG/ACT INHALER QVAR 80 MCG/ACT inhaler      INHALE 2 PUFFS INTO THE LUNGS 2 (TWO) TIMES DAILY.    INHALE 2 PUFFS INTO THE LUNGS 2 (TWO) TIMES DAILY.   FLUTICASONE (FLONASE) 50 MCG/ACT NASAL SPRAY fluticasone (FLONASE) 50 MCG/ACT nasal spray      Place 1 spray into both nostrils daily.    Place 1 spray into both nostrils daily.    HYOSCYAMINE (LEVSIN SL) 0.125 MG SL TABLET hyoscyamine (LEVSIN SL) 0.125 MG SL tablet      Place 1 tablet (0.125 mg total) under the tongue every 4 (four) hours as needed.    Place 1 tablet (0.125 mg total) under the tongue every 4 (four) hours as needed.   LIDOCAINE-PRILOCAINE (EMLA) CREAM lidocaine-prilocaine (EMLA) cream      APPLY TOPICALLY 4 (FOUR) TIMES DAILY AS NEEDED. FOUR TIMES A DAY TO AFFECTED AREA, 1 MONTH SUPPLY    APPLY TOPICALLY 4 (FOUR) TIMES DAILY AS NEEDED. FOUR TIMES A DAY TO AFFECTED AREA, 1 MONTH SUPPLY  Discontinued Medications   BACLOFEN (LIORESAL) 10 MG TABLET    Take 10 mg by  mouth 2 (two) times daily as needed for muscle spasms.    CHLORPROMAZINE (THORAZINE) 25 MG TABLET  Take 25-50 mg by mouth as needed (for headaches, takes twice a month).    ESCITALOPRAM (LEXAPRO) 10 MG TABLET    TAKE 1 TABLET BY MOUTH EVERY DAY   FEXOFENADINE (ALLEGRA) 180 MG TABLET    Take 1 tablet (180 mg total) by mouth daily.   LEVONORGESTREL-ETHINYL ESTRADIOL (ORSYTHIA) 0.1-20 MG-MCG TABLET    TAKE 1 TABLET BY MOUTH DAILY

## 2016-07-16 ENCOUNTER — Other Ambulatory Visit: Payer: Self-pay | Admitting: Family Medicine

## 2016-07-17 MED ORDER — TIZANIDINE HCL 4 MG PO TABS: 4.0000 mg | ORAL_TABLET | Freq: Every day | ORAL | 0 refills | Status: DC

## 2016-07-17 NOTE — Telephone Encounter (Signed)
Received fax from [facility] requesting 90 day supply. Ok to change to 90 day supply per Dr. Lorelei Pont.

## 2016-07-17 NOTE — Addendum Note (Signed)
Addended by: Carter Kitten on: 07/17/2016 03:48 PM   Modules accepted: Orders

## 2016-07-24 ENCOUNTER — Encounter: Payer: Self-pay | Admitting: Family Medicine

## 2016-07-24 ENCOUNTER — Ambulatory Visit (INDEPENDENT_AMBULATORY_CARE_PROVIDER_SITE_OTHER): Payer: BC Managed Care – PPO | Admitting: Family Medicine

## 2016-07-24 VITALS — BP 92/60 | HR 84 | Temp 97.5°F | Ht 66.25 in | Wt 216.5 lb

## 2016-07-24 DIAGNOSIS — Z23 Encounter for immunization: Secondary | ICD-10-CM | POA: Diagnosis not present

## 2016-07-24 DIAGNOSIS — M5432 Sciatica, left side: Secondary | ICD-10-CM

## 2016-07-24 DIAGNOSIS — M25562 Pain in left knee: Secondary | ICD-10-CM

## 2016-07-24 NOTE — Progress Notes (Signed)
Pre visit review using our clinic review tool, if applicable. No additional management support is needed unless otherwise documented below in the visit note. 

## 2016-07-24 NOTE — Progress Notes (Signed)
Dr. Frederico Hamman T. Zeynep Fantroy, MD, [facility] [place], [address] Phone: [phone] Fax: [phone]  07/24/2016  Patient: Candice Mcgrath, MRN: [id number], DOB: 1976/05/27, 40 y.o.  Primary Physician:  Owens Loffler, MD   Chief Complaint  Patient presents with  . Follow-up    Sciatica   Subjective:   Candice Mcgrath is a 40 y.o. very pleasant female patient who presents with the following:  L knee is popping on the side and also sciatica started before then.  Intensity of sciatica is hurting a lot - 5-8 minutes to get out of bed.   The patient has been having intermittent sciatica for a number years. She has had a flareup over the last few days to a week, and this is all on the left side. She has pain in her buttocks. She has not a rate numbness or tingling going down her left leg. She recently try to increase her exercise program and started to get more pain.  She also started to get some pain was some clicking and popping on her left knee. There is no mechanical locking up or giving way. She has not had any specific injury.  Overall she has improved quite a bit over the weekend and today compared to yesterday.  Past Medical History, Surgical History, Social History, Family History, Problem List, Medications, and Allergies have been reviewed and updated if relevant.  Patient Active Problem List   Diagnosis Date Noted  . Generalized anxiety disorder 09/02/2014    Priority: Medium  . Major depressive disorder, recurrent episode, in partial remission ([place]) 05/18/2014    Priority: Medium  . Personal history of drug dependence ([place]) 12/24/2012    Priority: Medium  . Migraine headache 12/24/2012    Priority: Medium  . ASTHMA 06/23/2010    Priority: Medium  . Personal history of alcoholism ([place]) 10/30/2008    Priority: Medium  . SCIATICA 11/02/2010  . IBS 12/13/2009  . HYPERLIPIDEMIA 11/08/2009  . ADD  11/08/2009  . ALLERGIC RHINITIS 11/01/2008  . NEPHROLITHIASIS, HX OF 11/01/2008  . GENITAL HERPES 10/30/2008    Past Medical History:  Diagnosis Date  . Allergic rhinitis   . Drug abuse    adderrall, Xanax, MJ, others, no IV  . Elevated BP   . ETOH abuse    [facility]  . Headache(784.0)   . Herpes   . Hyperlipidemia   . IBS (irritable bowel syndrome)   . Migraine headache 12/24/2012  . Nephrolithiasis   . Obesity   . Pre-diabetes     Past Surgical History:  Procedure Laterality Date  . Basket Surgery  2005, 06   kidney stones  . TYMPANOSTOMY TUBE PLACEMENT Bilateral    as a child    Social History   Social History  . Marital status: Married    Spouse name: N/A  . Number of children: N/A  . Years of education: N/A   Occupational History  . [employer]    @ [employer]   Social History Main Topics  . Smoking status: Former Smoker    Quit date: 06/24/2016  . Smokeless tobacco: Former Systems developer    Quit date: 12/04/2008  . Alcohol use 1.2 - 2.4 oz/week    2 - 4 Cans of beer per week     Comment: recovering AA  . Drug use: No     Comment: Recovering NA  . Sexual  activity: Not on file   Other Topics Concern  . Not on file   Social History Narrative  . No narrative on file    Family History  Problem Relation Age of Onset  . Alcohol abuse Other     GP  . Breast cancer Cousin   . Stroke Other     [place]  . Stroke Maternal Grandmother   . Cancer Maternal Grandfather   . Heart disease Paternal Grandfather     Allergies  Allergen Reactions  . Sulfamethoxazole-Trimethoprim     REACTION: itching    Medication list reviewed and updated in full in [place].  GEN: No fevers, chills. Nontoxic. Primarily MSK c/o today. MSK: Detailed in the HPI GI: tolerating PO intake without difficulty Neuro: No numbness, parasthesias, or tingling associated. Otherwise the pertinent positives of the ROS are noted  above.   Objective:   BP 92/60   Pulse 84   Temp 97.5 F (36.4 C) (Oral)   Ht 5' 6.25" (1.683 m)   Wt 216 lb 8 oz (98.2 kg)   LMP 07/10/2016   BMI 34.68 kg/m    GEN: Well-developed,well-nourished,in no acute distress; alert,appropriate and cooperative throughout examination HEENT: Normocephalic and atraumatic without obvious abnormalities. Ears, externally no deformities PULM: Breathing comfortably in no respiratory distress EXT: No clubbing, cyanosis, or edema PSYCH: Normally interactive. Cooperative during the interview. Pleasant. Friendly and conversant. Not anxious or depressed appearing. Normal, full affect.  Range of motion at  the waist: Flexion: normal Extension: normal Lateral bending: normal Rotation: all normal  No echymosis or edema Rises to examination table with no difficulty Gait: non antalgic  Inspection/Deformity: N Paraspinus Tenderness: l4-s1 b  B Ankle Dorsiflexion (L5,4): 5/5 B Great Toe Dorsiflexion (L5,4): 5/5 Heel Walk (L5): WNL Toe Walk (S1): WNL Rise/Squat (L4): WNL  SENSORY B Medial Foot (L4): WNL B Dorsum (L5): WNL B Lateral (S1): WNL Light Touch: WNL Pinprick: WNL  REFLEXES Knee (L4): 2+ Ankle (S1): 2+  B SLR, seated: neg B SLR, supine: neg B FABER: neg B Reverse FABER: neg B Greater Troch: NT B Log Roll: neg B Stork: NT B Sciatic Notch: NT   Knee:  b Gait: Normal heel toe pattern ROM: 0-125 Effusion: neg Echymosis or edema: none Patellar tendon NT Painful PLICA: neg Patellar grind: negative Medial and lateral patellar facet loading: negative medial and lateral joint lines:NT Mcmurray's neg Flexion-pinch neg Varus and valgus stress: stable Lachman: neg Ant and Post drawer: neg Hip abduction, IR, ER: WNL Hip flexion str: 5/5 Hip abd: 5/5 Quad: 5/5 VMO atrophy:No Hamstring concentric and eccentric: 5/5   Radiology: No results found.   Assessment and Plan:   Sciatica of left side  Need for prophylactic  vaccination and inoculation against influenza - Plan: Flu Vaccine QUAD 36+ mos IM  Acute pain of left knee  Exam is very reassuring. Prior sciatica. Reviewed stretches for piriformis.  Knee pain is likely secondary. Exam is very reassuring. No sign of internal derangement. Reassured the patient.  Oral Tylenol or Motrin as needed. Muscle lysis for back if needed.  Follow-up: prn  Orders Placed This Encounter  Procedures  . Flu Vaccine QUAD 36+ mos IM    Signed,  Mylan Schwarz T. Matheau Orona, MD   Patient's Medications  New Prescriptions   No medications on file  Previous Medications   ALBUTEROL (PROAIR HFA) 108 (90 BASE) MCG/ACT INHALER    INHALE 2 PUFFS INTO THE LUNGS EVERY 4 (FOUR) HOURS AS NEEDED. USE SPARINGLY  ATOMOXETINE (STRATTERA) 40 MG CAPSULE    TAKE 1 CAPSULE (40 MG TOTAL) BY MOUTH 2 (TWO) TIMES DAILY.   AZELASTINE (ASTELIN) 0.1 % NASAL SPRAY    Place 1 spray into both nostrils 2 (two) times daily. Use in each nostril as directed   BECLOMETHASONE (QVAR) 80 MCG/ACT INHALER    INHALE 2 PUFFS INTO THE LUNGS 2 (TWO) TIMES DAILY.   FLUTICASONE (FLONASE) 50 MCG/ACT NASAL SPRAY    Place 1 spray into both nostrils daily.   HYOSCYAMINE (LEVSIN SL) 0.125 MG SL TABLET    Place 1 tablet (0.125 mg total) under the tongue every 4 (four) hours as needed.   LEVOCETIRIZINE (XYZAL) 5 MG TABLET    TK 1 T PO QD IN THE EVE   LIDOCAINE-PRILOCAINE (EMLA) CREAM    APPLY TOPICALLY 4 (FOUR) TIMES DAILY AS NEEDED. FOUR TIMES A DAY TO AFFECTED AREA, 1 MONTH SUPPLY   TIZANIDINE (ZANAFLEX) 4 MG TABLET    Take 1 tablet (4 mg total) by mouth at bedtime.   TRIAMCINOLONE CREAM (KENALOG) 0.1 %    Apply 1 application topically 2 (two) times daily.  Modified Medications   No medications on file  Discontinued Medications   No medications on file

## 2016-11-02 ENCOUNTER — Encounter: Payer: Self-pay | Admitting: Family Medicine

## 2016-11-02 ENCOUNTER — Ambulatory Visit (INDEPENDENT_AMBULATORY_CARE_PROVIDER_SITE_OTHER): Payer: BC Managed Care – PPO | Admitting: Family Medicine

## 2016-11-02 ENCOUNTER — Encounter: Payer: Self-pay | Admitting: *Deleted

## 2016-11-02 VITALS — BP 118/84 | HR 85 | Temp 97.9°F | Ht 66.25 in | Wt 215.6 lb

## 2016-11-02 DIAGNOSIS — R6889 Other general symptoms and signs: Secondary | ICD-10-CM

## 2016-11-02 LAB — POC INFLUENZA A&B (BINAX/QUICKVUE)
INFLUENZA A, POC: NEGATIVE
Influenza B, POC: NEGATIVE

## 2016-11-02 NOTE — Patient Instructions (Signed)
Use flonase 2 sprays each nostril daily for 3 weeks.  Plenty of fluids. Imodium can help if any diarrhea.  INSTRUCTIONS FOR UPPER RESPIRATORY INFECTION:  -plenty of rest and fluids  -nasal saline wash 2-3 times daily (use prepackaged nasal saline or bottled/distilled water if making your own)   -can use tylenol (in no history of liver disease) or ibuprofen (if no history of kidney disease, bowel bleeding or significant heart disease) as directed for aches and sorethroat  -in the winter time, using a humidifier at night is helpful (please follow cleaning instructions)  -if you are taking a cough medication - use only as directed, may also try a teaspoon of honey to coat the throat and throat lozenges.  -for sore throat, salt water gargles can help  -follow up if you have fevers, facial pain, tooth pain, difficulty breathing or are worsening or symptoms persist longer then expected  Upper Respiratory Infection, Adult An upper respiratory infection (URI) is also known as the common cold. It is often caused by a type of germ (virus). Colds are easily spread (contagious). You can pass it to others by kissing, coughing, sneezing, or drinking out of the same glass. Usually, you get better in 1 to 3  weeks.  However, the cough can last for even longer. HOME CARE   Only take medicine as told by your doctor. Follow instructions provided above.  Drink enough water and fluids to keep your pee (urine) clear or pale yellow.  Get plenty of rest.  Return to work when your temperature is < 100 for 24 hours or as told by your doctor. You may use a face mask and wash your hands to stop your cold from spreading. GET HELP RIGHT AWAY IF:   After the first few days, you feel you are getting worse.  You have questions about your medicine.  You have chills, shortness of breath, or red spit (mucus).  You have pain in the face for more then 1-2 days, especially when you bend forward.  You have a fever,  puffy (swollen) neck, pain when you swallow, or white spots in the back of your throat.  You have a bad headache, ear pain, sinus pain, or chest pain.  You have a high-pitched whistling sound when you breathe in and out (wheezing).  You cough up blood.  You have sore muscles or a stiff neck. MAKE SURE YOU:   Understand these instructions.  Will watch your condition.  Will get help right away if you are not doing well or get worse. Document Released: 03/27/2008 Document Revised: 01/01/2012 Document Reviewed: 01/14/2014 [facility] Patient Information 2015 [place], [place]. This information is not intended to replace advice given to you by your health care provider. Make sure you discuss any questions you have with your health care provider.

## 2016-11-02 NOTE — Progress Notes (Signed)
Pre visit review using our clinic review tool, if applicable. No additional management support is needed unless otherwise documented below in the visit note. 

## 2016-11-02 NOTE — Progress Notes (Signed)
HPI:  ? Flu: -started: 2-3 days ago -symptoms:nasal congestion, cough, ears popping, ears feel full, loose bowels 1-2 times daily - has IBS -wants release from work note for today and tomorrow -denies:fever, SOB, NVD, tooth pain, body aches -has tried: nothing -sick contacts/travel/risks: no reported flu, strep or tick exposure -Hx of: allergies, asthma, IBS, fibro - not taking nasal spray though reports is supposed to  ROS: See pertinent positives and negatives per HPI.  Past Medical History:  Diagnosis Date  . Allergic rhinitis   . Drug abuse    adderrall, Xanax, MJ, others, no IV  . Elevated BP   . ETOH abuse    initially sober 2007, [facility]  . Headache(784.0)   . Herpes   . Hyperlipidemia   . IBS (irritable bowel syndrome)   . Migraine headache 12/24/2012  . Nephrolithiasis   . Obesity   . Pre-diabetes     Past Surgical History:  Procedure Laterality Date  . Basket Surgery  2005, 06   kidney stones  . TYMPANOSTOMY TUBE PLACEMENT Bilateral    as a child    Family History  Problem Relation Age of Onset  . Alcohol abuse Other     GP  . Breast cancer Cousin   . Stroke Other     [place]  . Stroke Maternal Grandmother   . Cancer Maternal Grandfather   . Heart disease Paternal Grandfather     Social History   Social History  . Marital status: Married    Spouse name: N/A  . Number of children: N/A  . Years of education: N/A   Occupational History  . [place]    @ [employer]   Social History Main Topics  . Smoking status: Former Smoker    Quit date: 06/24/2016  . Smokeless tobacco: Former Systems developer    Quit date: 12/04/2008  . Alcohol use 1.2 - 2.4 oz/week    2 - 4 Cans of beer per week     Comment: recovering AA  . Drug use: No     Comment: Recovering NA  . Sexual activity: Not Asked   Other Topics Concern  . None   Social History Narrative  . None     Current Outpatient Prescriptions:  .  albuterol (PROAIR  HFA) 108 (90 Base) MCG/ACT inhaler, INHALE 2 PUFFS INTO THE LUNGS EVERY 4 (FOUR) HOURS AS NEEDED. USE SPARINGLY, Disp: 8.5 each, Rfl: 4 .  atomoxetine (STRATTERA) 40 MG capsule, TAKE 1 CAPSULE (40 MG TOTAL) BY MOUTH 2 (TWO) TIMES DAILY., Disp: 60 capsule, Rfl: 5 .  azelastine (ASTELIN) 0.1 % nasal spray, Place 1 spray into both nostrils 2 (two) times daily. Use in each nostril as directed, Disp: , Rfl:  .  beclomethasone (QVAR) 80 MCG/ACT inhaler, INHALE 2 PUFFS INTO THE LUNGS 2 (TWO) TIMES DAILY., Disp: 8.7 g, Rfl: 5 .  fluticasone (FLONASE) 50 MCG/ACT nasal spray, Place 1 spray into both nostrils daily., Disp: 16 g, Rfl: 5 .  hyoscyamine (LEVSIN SL) 0.125 MG SL tablet, Place 1 tablet (0.125 mg total) under the tongue every 4 (four) hours as needed., Disp: 30 tablet, Rfl: 11 .  levocetirizine (XYZAL) 5 MG tablet, TK 1 T PO QD IN THE EVE, Disp: , Rfl: 3 .  lidocaine-prilocaine (EMLA) cream, APPLY TOPICALLY 4 (FOUR) TIMES DAILY AS NEEDED. FOUR TIMES A DAY TO AFFECTED AREA, 1 MONTH SUPPLY, Disp: 30 g, Rfl: 2 .  tiZANidine (ZANAFLEX) 4 MG tablet, Take 1  tablet (4 mg total) by mouth at bedtime., Disp: 90 tablet, Rfl: 0 .  triamcinolone cream (KENALOG) 0.1 %, Apply 1 application topically 2 (two) times daily., Disp: 454 g, Rfl: 0  EXAM:  Vitals:   11/02/16 1452  BP: 118/84  Pulse: 85  Temp: 97.9 F (36.6 C)    Body mass index is 34.54 kg/m.  GENERAL: vitals reviewed and listed above, alert, oriented, appears well hydrated and in no acute distress  HEENT: atraumatic, conjunttiva clear, no obvious abnormalities on inspection of external nose and ears, normal appearance of ear canals and TMs with clear effusion bilat, clear nasal congestion, mild post oropharyngeal erythema with PND, no tonsillar edema or exudate, no sinus TTP  NECK: no obvious masses on inspection  LUNGS: clear to auscultation bilaterally, no wheezes, rales or rhonchi, good air movement  CV: HRRR, no peripheral edema  MS:  moves all extremities without noticeable abnormality  PSYCH: pleasant and cooperative, no obvious depression or anxiety  ASSESSMENT AND PLAN:  Discussed the following assessment and plan:  Flu-like symptoms - Plan: POC Influenza A&B(BINAX/QUICKVUE)  -given HPI and exam findings today, a serious infection or illness is unlikely. We discussed potential etiologies, with VURI or allergies with mild ETD being most likely, and advised supportive care and monitoring and getting back on INS. Flu test neg. If diarrhea can use imodium. If persistent change in bowels in 1-2 weeks or worsening advised follow up. Work note seen.We discussed treatment side effects, likely course, antibiotic misuse, transmission, and signs of developing a serious illness. -of course, we advised to return or notify a doctor immediately if symptoms worsen or persist or new concerns arise.    Patient Instructions  Use flonase 2 sprays each nostril daily for 3 weeks.  Plenty of fluids. Imodium can help if any diarrhea.  INSTRUCTIONS FOR UPPER RESPIRATORY INFECTION:  -plenty of rest and fluids  -nasal saline wash 2-3 times daily (use prepackaged nasal saline or bottled/distilled water if making your own)   -can use tylenol (in no history of liver disease) or ibuprofen (if no history of kidney disease, bowel bleeding or significant heart disease) as directed for aches and sorethroat  -in the winter time, using a humidifier at night is helpful (please follow cleaning instructions)  -if you are taking a cough medication - use only as directed, may also try a teaspoon of honey to coat the throat and throat lozenges.  -for sore throat, salt water gargles can help  -follow up if you have fevers, facial pain, tooth pain, difficulty breathing or are worsening or symptoms persist longer then expected  Upper Respiratory Infection, Adult An upper respiratory infection (URI) is also known as the common cold. It is often caused  by a type of germ (virus). Colds are easily spread (contagious). You can pass it to others by kissing, coughing, sneezing, or drinking out of the same glass. Usually, you get better in 1 to 3  weeks.  However, the cough can last for even longer. HOME CARE   Only take medicine as told by your doctor. Follow instructions provided above.  Drink enough water and fluids to keep your pee (urine) clear or pale yellow.  Get plenty of rest.  Return to work when your temperature is < 100 for 24 hours or as told by your doctor. You may use a face mask and wash your hands to stop your cold from spreading. GET HELP RIGHT AWAY IF:   After the first few days, you  feel you are getting worse.  You have questions about your medicine.  You have chills, shortness of breath, or red spit (mucus).  You have pain in the face for more then 1-2 days, especially when you bend forward.  You have a fever, puffy (swollen) neck, pain when you swallow, or white spots in the back of your throat.  You have a bad headache, ear pain, sinus pain, or chest pain.  You have a high-pitched whistling sound when you breathe in and out (wheezing).  You cough up blood.  You have sore muscles or a stiff neck. MAKE SURE YOU:   Understand these instructions.  Will watch your condition.  Will get help right away if you are not doing well or get worse. Document Released: 03/27/2008 Document Revised: 01/01/2012 Document Reviewed: 01/14/2014 [facility] Patient Information 2015 [place], [place]. This information is not intended to replace advice given to you by your health care provider. Make sure you discuss any questions you have with your health care provider.    Colin Benton R., DO

## 2016-11-03 NOTE — Telephone Encounter (Signed)
I spoke with her on the phone.  I think this sounds like she has primarily a viral illness with some GI component.  IBS at baseline is compounding this.  Recommended Levsin as needed, pushing fluids.  Imodium p.r.n. For diarrhea.  Rest.  Reassurance given over the phone.  She is able to take p.o. Intake.

## 2016-11-06 ENCOUNTER — Ambulatory Visit: Payer: Self-pay | Admitting: Family Medicine

## 2016-11-06 ENCOUNTER — Telehealth: Payer: Self-pay | Admitting: Family Medicine

## 2016-11-06 NOTE — Telephone Encounter (Signed)
Patient did not come in for their appointment today for struggling with stomach  Please let me know if patient needs to be contacted immediately for follow up or no follow up needed.

## 2016-11-06 NOTE — Telephone Encounter (Signed)
F/u prn

## 2016-11-15 ENCOUNTER — Encounter: Payer: Self-pay | Admitting: Family Medicine

## 2016-12-05 ENCOUNTER — Other Ambulatory Visit: Payer: Self-pay | Admitting: Family Medicine

## 2016-12-05 NOTE — Telephone Encounter (Signed)
Last office visit 07/24/2016.  Last refilled 03/06/2016 for #60 with 5 refills.  Ok to refill?

## 2016-12-06 ENCOUNTER — Telehealth: Payer: Self-pay | Admitting: *Deleted

## 2016-12-06 NOTE — Telephone Encounter (Signed)
PA for Strattera approved from 12/06/2016 through 02/14/2020.  [facility] notified of approval via fax.

## 2016-12-06 NOTE — Telephone Encounter (Signed)
Received fax from [facility] requesting a PA for Strattera 40 mg.  PA completed on CoverMyMeds.  Awaiting decision.

## 2017-03-27 ENCOUNTER — Other Ambulatory Visit: Payer: Self-pay | Admitting: Family Medicine

## 2017-03-27 ENCOUNTER — Encounter: Payer: Self-pay | Admitting: Family Medicine

## 2017-03-28 NOTE — Telephone Encounter (Signed)
Last office visit 07/24/2016.  Last refilled 07/17/2016 for #90 with no refills.  Ok to refill?

## 2017-03-28 NOTE — Telephone Encounter (Signed)
Can you have her schedule an appointment - we will need to look up malaria, cdc recs, etc. vaccines

## 2017-04-04 ENCOUNTER — Ambulatory Visit (INDEPENDENT_AMBULATORY_CARE_PROVIDER_SITE_OTHER): Payer: BC Managed Care – PPO | Admitting: Family Medicine

## 2017-04-04 ENCOUNTER — Encounter: Payer: Self-pay | Admitting: Family Medicine

## 2017-04-04 VITALS — BP 100/80 | HR 90 | Temp 97.7°F | Ht 66.25 in | Wt 204.2 lb

## 2017-04-04 DIAGNOSIS — Z7189 Other specified counseling: Secondary | ICD-10-CM

## 2017-04-04 DIAGNOSIS — Z23 Encounter for immunization: Secondary | ICD-10-CM

## 2017-04-04 DIAGNOSIS — Z7184 Encounter for health counseling related to travel: Secondary | ICD-10-CM

## 2017-04-04 MED ORDER — CIPROFLOXACIN HCL 500 MG PO TABS: 500.0000 mg | ORAL_TABLET | Freq: Two times a day (BID) | ORAL | 0 refills | Status: DC

## 2017-04-04 NOTE — Progress Notes (Signed)
Dr. Frederico Hamman T. Preciliano Castell, MD, [facility] [place], [address] Phone: [phone] Fax: [phone]  04/04/2017  Patient: Candice Mcgrath, MRN: [id number], DOB: [birth year], 41 y.o.  Primary Physician:  Owens Loffler, MD   Chief Complaint  Patient presents with  . Travel Vaccines   Subjective:   Candice Mcgrath is a 41 y.o. very pleasant female patient who presents with the following:  Very pleasant patient who is known well and she is going to the [place] state in [place] for a mission trip.   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 She overall feels well now.  Past Medical History, Surgical History, Social History, Family History, Problem List, Medications, and Allergies have been reviewed and updated if relevant.  Patient Active Problem List   Diagnosis Date Noted  . Generalized anxiety disorder 09/02/2014    Priority: Medium  . Major depressive disorder, recurrent episode, in partial remission ([place]) 05/18/2014    Priority: Medium  . Personal history of drug dependence ([place]) 12/24/2012    Priority: Medium  . Migraine headache 12/24/2012    Priority: Medium  . ASTHMA 06/23/2010    Priority: Medium  . Personal history of alcoholism ([place]) 10/30/2008    Priority: Medium  . SCIATICA 11/02/2010  . IBS 12/13/2009  . HYPERLIPIDEMIA 11/08/2009  . ADD 11/08/2009  . ALLERGIC RHINITIS 11/01/2008  . NEPHROLITHIASIS, HX OF 11/01/2008  . GENITAL HERPES 10/30/2008    Past Medical History:  Diagnosis Date  . Allergic rhinitis   . Drug abuse    adderrall, Xanax, MJ, others, no IV  . Elevated BP   . ETOH abuse    initially sober 2007, [facility]  . Headache(784.0)   . Herpes   . Hyperlipidemia   . IBS (irritable bowel syndrome)   . Migraine headache 12/24/2012  . Nephrolithiasis   . Obesity   . Pre-diabetes     Past Surgical History:  Procedure Laterality Date  . Basket Surgery  2005, 06   kidney stones  . TYMPANOSTOMY TUBE PLACEMENT Bilateral    as a child    Social History   Social History  . Marital status: Married    Spouse name: N/A  . Number of children: N/A  . Years of education: N/A   Occupational History  . [place]    @ [employer]   Social History Main Topics  . Smoking status: Former Smoker    Quit date: 06/24/2016  . Smokeless tobacco: Former Systems developer    Quit date: 12/04/2008  . Alcohol use 1.2 - 2.4 oz/week    2 - 4 Cans of beer per week     Comment: recovering AA  . Drug use: No     Comment: Recovering NA  . Sexual activity: Not on file   Other Topics Concern  . Not on file   Social History Narrative  . No narrative on file    Family History  Problem Relation Age of Onset  . Alcohol abuse Other        GP  . Breast cancer Cousin   . Stroke Other        [place]  . Stroke Maternal Grandmother   . Cancer Maternal Grandfather   . Heart disease Paternal Grandfather     Allergies  Allergen Reactions  . Sulfamethoxazole-Trimethoprim     REACTION: itching    Medication list reviewed and updated in full in [facility].   GEN: No acute illnesses, no fevers, chills. GI: No n/v/d, eating normally Pulm: No SOB Interactive and getting along well at home.  Otherwise, ROS is as per the HPI.  Objective:   BP 100/80   Pulse 90   Temp 97.7 F (36.5 C) (Oral)   Ht 5' 6.25" (1.683 m)   Wt 204 lb 4 oz (92.6 kg)   LMP 03/19/2017   BMI 32.72 kg/m   GEN: WDWN, NAD, Non-toxic, A & O x 3 HEENT: Atraumatic, Normocephalic. Neck supple. No masses, No LAD. Ears and Nose: No external deformity. EXTR: No c/c/e NEURO Normal gait.  PSYCH: Normally interactive. Conversant. Not depressed or anxious appearing.  Calm demeanor.   Laboratory and Imaging Data:  Assessment and Plan:   Travel advice encounter  Need for prophylactic vaccination with combined diphtheria-tetanus-pertussis (DTP) vaccine - Plan: Tdap  vaccine greater than or equal to 7yo IM  Vaccine for viral hepatitis - Plan: Hepatitis A hepatitis B combined vaccine IM, Tdap vaccine greater than or equal to 7yo IM  Given vaccines, and travel Cipro. Reviewed CDC data, and given her location of travel, malaria prophylaxis is not needed, but recommended bug spray.  Follow-up: prn  Future Appointments Date Time Provider [place]  05/02/2017 11:30 AM Eliu Batch, Frederico Hamman, MD [id number]    Meds ordered this encounter  Medications  . ciprofloxacin (CIPRO) 500 MG tablet    Sig: Take 1 tablet (500 mg total) by mouth 2 (two) times daily. For 3 days if needed for diarrhea    Dispense:  12 tablet    Refill:  0   Medications Discontinued During This Encounter  Medication Reason  . tiZANidine (ZANAFLEX) 4 MG tablet Duplicate   Orders Placed This Encounter  Procedures  . Hepatitis A hepatitis B combined vaccine IM  . Tdap vaccine greater than or equal to 7yo IM    Signed,  Nayleen Janosik T. Gaylyn Berish, MD   Allergies as of 04/04/2017      Reactions   Sulfamethoxazole-trimethoprim    REACTION: itching      Medication List       Accurate as of 04/04/17 11:36 AM. Always use your most recent med list.          albuterol 108 (90 Base) MCG/ACT inhaler Commonly known as:  PROAIR HFA INHALE 2 PUFFS INTO THE LUNGS EVERY 4 (FOUR) HOURS AS NEEDED. USE SPARINGLY   azelastine 0.1 % nasal spray Commonly known as:  ASTELIN Place 1 spray into both nostrils 2 (two) times daily. Use in each nostril as directed   beclomethasone 80 MCG/ACT inhaler Commonly known as:  QVAR INHALE 2 PUFFS INTO THE LUNGS 2 (TWO) TIMES DAILY.   ciprofloxacin 500 MG tablet Commonly known as:  CIPRO Take 1 tablet (500 mg total) by mouth 2 (two) times daily. For 3 days if needed for diarrhea   fluticasone 50 MCG/ACT nasal spray Commonly known as:  FLONASE Place 1 spray into both nostrils daily.   hyoscyamine 0.125 MG SL tablet Commonly known as:   LEVSIN SL Place 1 tablet (0.125 mg total) under the tongue every 4 (four) hours as needed.   levocetirizine 5 MG tablet Commonly known as:  XYZAL TK 1 T PO QD IN THE EVE   lidocaine-prilocaine cream Commonly known as:  EMLA APPLY TOPICALLY 4 (FOUR) TIMES DAILY AS NEEDED. FOUR TIMES A DAY TO AFFECTED AREA, 1 MONTH SUPPLY   STRATTERA 40 MG capsule Generic drug:  atomoxetine TAKE 1 CAPSULE BY  MOUTH TWICE DAILY   tiZANidine 4 MG tablet Commonly known as:  ZANAFLEX TAKE 1 TABLET AT BEDTIME   triamcinolone cream 0.1 % Commonly known as:  KENALOG Apply 1 application topically 2 (two) times daily.

## 2017-04-17 ENCOUNTER — Other Ambulatory Visit: Payer: Self-pay | Admitting: Family Medicine

## 2017-04-17 DIAGNOSIS — Z0000 Encounter for general adult medical examination without abnormal findings: Secondary | ICD-10-CM

## 2017-04-19 ENCOUNTER — Other Ambulatory Visit (INDEPENDENT_AMBULATORY_CARE_PROVIDER_SITE_OTHER): Payer: BC Managed Care – PPO

## 2017-04-19 ENCOUNTER — Other Ambulatory Visit: Payer: BC Managed Care – PPO

## 2017-04-19 DIAGNOSIS — Z0000 Encounter for general adult medical examination without abnormal findings: Secondary | ICD-10-CM | POA: Diagnosis not present

## 2017-04-19 LAB — HEPATIC FUNCTION PANEL
ALBUMIN: 4.1 g/dL (ref 3.5–5.2)
ALK PHOS: 77 U/L (ref 39–117)
ALT: 18 U/L (ref 0–35)
AST: 17 U/L (ref 0–37)
BILIRUBIN TOTAL: 0.5 mg/dL (ref 0.2–1.2)
Bilirubin, Direct: 0.1 mg/dL (ref 0.0–0.3)
Total Protein: 7.1 g/dL (ref 6.0–8.3)

## 2017-04-19 LAB — CBC WITH DIFFERENTIAL/PLATELET
BASOS ABS: 0 10*3/uL (ref 0.0–0.1)
BASOS PCT: 0.5 % (ref 0.0–3.0)
Eosinophils Absolute: 0.2 10*3/uL (ref 0.0–0.7)
Eosinophils Relative: 2.5 % (ref 0.0–5.0)
HCT: 41.3 % (ref 36.0–46.0)
Hemoglobin: 14.1 g/dL (ref 12.0–15.0)
LYMPHS ABS: 1.7 10*3/uL (ref 0.7–4.0)
LYMPHS PCT: 19.2 % (ref 12.0–46.0)
MCHC: 34.1 g/dL (ref 30.0–36.0)
MCV: 92.9 fl (ref 78.0–100.0)
MONOS PCT: 8.7 % (ref 3.0–12.0)
Monocytes Absolute: 0.8 10*3/uL (ref 0.1–1.0)
NEUTROS ABS: 6 10*3/uL (ref 1.4–7.7)
Neutrophils Relative %: 69.1 % (ref 43.0–77.0)
PLATELETS: 285 10*3/uL (ref 150.0–400.0)
RBC: 4.44 Mil/uL (ref 3.87–5.11)
RDW: 13.4 % (ref 11.5–15.5)
WBC: 8.7 10*3/uL (ref 4.0–10.5)

## 2017-04-19 LAB — LIPID PANEL
CHOL/HDL RATIO: 3
Cholesterol: 165 mg/dL (ref 0–200)
HDL: 51.8 mg/dL (ref >39.00)
LDL Cholesterol: 91 mg/dL (ref 0–99)
NONHDL: 112.77
Triglycerides: 108 mg/dL (ref 0.0–149.0)
VLDL: 21.6 mg/dL (ref 0.0–40.0)

## 2017-04-19 LAB — TSH: TSH: 2.79 u[IU]/mL (ref 0.35–4.50)

## 2017-04-19 LAB — BASIC METABOLIC PANEL
BUN: 10 mg/dL (ref 6–23)
CALCIUM: 9.3 mg/dL (ref 8.4–10.5)
CHLORIDE: 102 meq/L (ref 96–112)
CO2: 26 meq/L (ref 19–32)
Creatinine, Ser: 0.81 mg/dL (ref 0.40–1.20)
GFR: 83.43 mL/min (ref >60.00)
GLUCOSE: 106 mg/dL — AB (ref 70–99)
Potassium: 4.1 mEq/L (ref 3.5–5.1)
Sodium: 137 mEq/L (ref 135–145)

## 2017-05-02 ENCOUNTER — Ambulatory Visit (INDEPENDENT_AMBULATORY_CARE_PROVIDER_SITE_OTHER): Payer: BC Managed Care – PPO | Admitting: Family Medicine

## 2017-05-02 ENCOUNTER — Encounter: Payer: Self-pay | Admitting: Family Medicine

## 2017-05-02 VITALS — BP 100/72 | HR 96 | Temp 98.3°F | Ht 61.5 in | Wt 208.0 lb

## 2017-05-02 DIAGNOSIS — Z0000 Encounter for general adult medical examination without abnormal findings: Secondary | ICD-10-CM

## 2017-05-02 NOTE — Progress Notes (Signed)
Dr. Frederico Hamman T. Copland, MD, [facility] [place], [address] Phone: [phone] Fax: [phone]  05/02/2017  Patient: Candice Mcgrath, MRN: [id number], DOB: 12-12-1975, 41 y.o.  Primary Physician:  Owens Loffler, MD   Chief Complaint  Patient presents with  . Annual Exam   Subjective:   Candice Mcgrath is a 41 y.o. pleasant patient who presents with the following:  Health Maintenance Summary Reviewed and updated, unless pt declines services.  Walking twice a day, weights.  No smoking.  Tobacco History Reviewed. Non-smoker Alcohol: she is drinking some again.  1-3 drinks of alcohol per night.  Denies excessive use.  Denies drug use. Exercise Habits: walking, rec at least 30 mins 5 times a week STD concerns: none Drug Use: None Menses regular: yes Lumps or breast concerns: no Breast Cancer Family History: cousin  Health Maintenance  Topic Date Due  . INFLUENZA VACCINE  05/23/2017  . PAP SMEAR  06/07/2018  . TETANUS/TDAP  04/05/2027  . HIV Screening  Completed    Immunization History  Administered Date(s) Administered  . Hep A / Hep B 04/04/2017  . Influenza, Seasonal, Injecte, Preservative Fre 09/02/2014  . Influenza,inj,Quad PF,36+ Mos 07/10/2013, 07/24/2016  . Tdap 04/04/2017   Patient Active Problem List   Diagnosis Date Noted  . Generalized anxiety disorder 09/02/2014    Priority: Medium  . Major depressive disorder, recurrent episode, in partial remission ([place]) 05/18/2014    Priority: Medium  . Personal history of drug dependence ([place]) 12/24/2012    Priority: Medium  . Migraine headache 12/24/2012    Priority: Medium  . ASTHMA 06/23/2010    Priority: Medium  . Personal history of alcoholism ([place]) 10/30/2008    Priority: Medium  . SCIATICA 11/02/2010  . IBS 12/13/2009  . HYPERLIPIDEMIA 11/08/2009  . ADD 11/08/2009  . ALLERGIC RHINITIS 11/01/2008  . NEPHROLITHIASIS, HX OF  11/01/2008  . GENITAL HERPES 10/30/2008   Past Medical History:  Diagnosis Date  . Allergic rhinitis   . Drug abuse    adderrall, Xanax, MJ, others, no IV  . Elevated BP   . ETOH abuse    initially sober 2007, [facility]  . Headache(784.0)   . Herpes   . Hyperlipidemia   . IBS (irritable bowel syndrome)   . Migraine headache 12/24/2012  . Nephrolithiasis   . Obesity   . Pre-diabetes    Past Surgical History:  Procedure Laterality Date  . Basket Surgery  2005, 06   kidney stones  . TYMPANOSTOMY TUBE PLACEMENT Bilateral    as a child   Social History   Social History  . Marital status: Married    Spouse name: N/A  . Number of children: N/A  . Years of education: N/A   Occupational History  . [employer]    @ [employer]   Social History Main Topics  . Smoking status: Former Smoker    Quit date: 06/24/2016  . Smokeless tobacco: Former Systems developer    Quit date: 12/04/2008  . Alcohol use 1.2 - 2.4 oz/week    2 - 4 Cans of beer per week     Comment: recovering AA  . Drug use: No     Comment: Recovering NA  . Sexual activity: Not on file   Other Topics Concern  . Not on file   Social History Narrative  . No narrative on file   Family History  Problem Relation  Age of Onset  . Alcohol abuse Other        GP  . Breast cancer Cousin   . Stroke Other        [place]  . Stroke Maternal Grandmother   . Cancer Maternal Grandfather   . Heart disease Paternal Grandfather    Allergies  Allergen Reactions  . Sulfamethoxazole-Trimethoprim     REACTION: itching    Medication list has been reviewed and updated.   General: Denies fever, chills, sweats. No significant weight loss. Eyes: Denies blurring,significant itching ENT: Denies earache, sore throat, and hoarseness.  Cardiovascular: Denies chest pains, palpitations, dyspnea on exertion,  Respiratory: Denies cough, dyspnea at rest,wheeezing Breast: no concerns about lumps GI: Denies  nausea, vomiting, diarrhea, constipation, change in bowel habits, abdominal pain, melena, hematochezia GU: Denies dysuria, hematuria, urinary hesitancy, nocturia, denies STD risk, no concerns about discharge Musculoskeletal: Denies back pain, joint pain Derm: Denies rash, itching Neuro: Denies  paresthesias, frequent falls, frequent headaches Psych: Denies depression, anxiety Endocrine: Denies cold intolerance, heat intolerance, polydipsia Heme: Denies enlarged lymph nodes Allergy: No hayfever  Objective:   BP 100/72   Pulse 96   Temp 98.3 F (36.8 C) (Oral)   Ht 5' 1.5" (1.562 m)   Wt 208 lb (94.3 kg)   LMP 04/12/2017   BMI 38.66 kg/m  No exam data present  GEN: well developed, well nourished, no acute distress Eyes: conjunctiva and lids normal, PERRLA, EOMI ENT: TM clear, nares clear, oral exam WNL Neck: supple, no lymphadenopathy, no thyromegaly, no JVD Pulm: clear to auscultation and percussion, respiratory effort normal CV: regular rate and rhythm, S1-S2, no murmur, rub or gallop, no bruits Chest: no scars, masses, no lumps BREAST: breast exam declined GI: soft, non-tender; no hepatosplenomegaly, masses; active bowel sounds all quadrants GU: GU exam declined Lymph: no cervical, axillary or inguinal adenopathy MSK: gait normal, muscle tone and strength WNL, no joint swelling, effusions, discoloration, crepitus  SKIN: clear, good turgor, color WNL, no rashes, lesions, or ulcerations Neuro: normal mental status, normal strength, sensation, and motion Psych: alert; oriented to person, place and time, normally interactive and not anxious or depressed in appearance.   All labs reviewed with patient. Lipids:    Component Value Date/Time   CHOL 165 04/19/2017 0822   TRIG 108.0 04/19/2017 0822   HDL 51.80 04/19/2017 0822   VLDL 21.6 04/19/2017 0822   CHOLHDL 3 04/19/2017 0822   CBC: CBC Latest Ref Rng & Units 04/19/2017 07/03/2013 06/06/2012  WBC 4.0 - 10.5 K/uL 8.7  11.1(H) 8.1  Hemoglobin 12.0 - 15.0 g/dL 14.1 14.2 13.8  Hematocrit 36.0 - 46.0 % 41.3 42.1 41.0  Platelets 150.0 - 400.0 K/uL 285.0 280.0 678.9    Basic Metabolic Panel:    Component Value Date/Time   NA 137 04/19/2017 0822   K 4.1 04/19/2017 0822   CL 102 04/19/2017 0822   CO2 26 04/19/2017 0822   BUN 10 04/19/2017 0822   CREATININE 0.81 04/19/2017 0822   GLUCOSE 106 (H) 04/19/2017 0822   CALCIUM 9.3 04/19/2017 0822   Hepatic Function Latest Ref Rng & Units 04/19/2017 07/03/2013 06/06/2012  Total Protein 6.0 - 8.3 g/dL 7.1 7.2 7.1  Albumin 3.5 - 5.2 g/dL 4.1 3.6 3.8  AST 0 - 37 U/L _0 ALT 0 - 35 U/L _1 Alk Phosphatase 39 - 117 U/L 77 72 59  Total Bilirubin 0.2 - 1.2 mg/dL 0.5 0.4 0.1(L)  Bilirubin, Direct 0.0 - 0.3  mg/dL 0.1 0.0 0.0    Lab Results  Component Value Date   TSH 2.79 04/19/2017   No results found.  Assessment and Plan:   Healthcare maintenance   Health Maintenance Exam: The patient's preventative maintenance and recommended screening tests for an annual wellness exam were reviewed in full today. Brought up to date unless services declined.  Counselled on the importance of diet, exercise, and its role in overall health and mortality. The patient's FH and SH was reviewed, including their home life, tobacco status, and drug and alcohol status.  Follow-up in 1 year for physical exam or additional follow-up below.  Follow-up: No Follow-up on file. Or follow-up in 1 year if not noted.  Future Appointments Date Time Provider [place]  05/03/2018 8:00 AM [facility]  05/08/2018 8:30 AM Jarvin Ogren, Frederico Hamman, MD [facility]    Meds ordered this encounter  Medications  . EPINEPHrine 0.3 mg/0.3 mL IJ SOAJ injection    Sig: USE UTD PRF SYSTEMATIC REACTION.    Refill:  1  . FLOVENT HFA 110 MCG/ACT inhaler    Sig: INL 2 PFS PO BID    Refill:  6  . PAZEO 0.7 % SOLN    Refill:  3   Medications Discontinued  During This Encounter  Medication Reason  . ciprofloxacin (CIPRO) 500 MG tablet Completed Course  . beclomethasone (QVAR) 80 MCG/ACT inhaler Completed Course   No orders of the defined types were placed in this encounter.   Signed,  Maud Deed. Andoni Busch, MD   Allergies as of 05/02/2017      Reactions   Sulfamethoxazole-trimethoprim    REACTION: itching      Medication List       Accurate as of 05/02/17 11:59 PM. Always use your most recent med list.          albuterol 108 (90 Base) MCG/ACT inhaler Commonly known as:  PROAIR HFA INHALE 2 PUFFS INTO THE LUNGS EVERY 4 (FOUR) HOURS AS NEEDED. USE SPARINGLY   azelastine 0.1 % nasal spray Commonly known as:  ASTELIN Place 1 spray into both nostrils 2 (two) times daily. Use in each nostril as directed   EPINEPHrine 0.3 mg/0.3 mL Soaj injection Commonly known as:  EPI-PEN USE UTD PRF SYSTEMATIC REACTION.   FLOVENT HFA 110 MCG/ACT inhaler Generic drug:  fluticasone INL 2 PFS PO BID   fluticasone 50 MCG/ACT nasal spray Commonly known as:  FLONASE Place 1 spray into both nostrils daily.   hyoscyamine 0.125 MG SL tablet Commonly known as:  LEVSIN SL Place 1 tablet (0.125 mg total) under the tongue every 4 (four) hours as needed.   levocetirizine 5 MG tablet Commonly known as:  XYZAL TK 1 T PO QD IN THE EVE   lidocaine-prilocaine cream Commonly known as:  EMLA APPLY TOPICALLY 4 (FOUR) TIMES DAILY AS NEEDED. FOUR TIMES A DAY TO AFFECTED AREA, 1 MONTH SUPPLY   PAZEO 0.7 % Soln Generic drug:  Olopatadine HCl   STRATTERA 40 MG capsule Generic drug:  atomoxetine TAKE 1 CAPSULE BY MOUTH TWICE DAILY   tiZANidine 4 MG tablet Commonly known as:  ZANAFLEX TAKE 1 TABLET AT BEDTIME   triamcinolone cream 0.1 % Commonly known as:  KENALOG Apply 1 application topically 2 (two) times daily.

## 2017-05-04 ENCOUNTER — Encounter: Payer: Self-pay | Admitting: Family Medicine

## 2017-07-25 ENCOUNTER — Encounter: Payer: Self-pay | Admitting: Family Medicine

## 2017-07-25 ENCOUNTER — Ambulatory Visit (INDEPENDENT_AMBULATORY_CARE_PROVIDER_SITE_OTHER): Payer: BC Managed Care – PPO | Admitting: Family Medicine

## 2017-07-25 VITALS — BP 104/78 | HR 99 | Temp 97.5°F | Ht 61.5 in | Wt 214.8 lb

## 2017-07-25 DIAGNOSIS — R350 Frequency of micturition: Secondary | ICD-10-CM | POA: Diagnosis not present

## 2017-07-25 LAB — POC URINALSYSI DIPSTICK (AUTOMATED)
BILIRUBIN UA: NEGATIVE
GLUCOSE UA: NEGATIVE
Ketones, UA: NEGATIVE
Leukocytes, UA: NEGATIVE
NITRITE UA: NEGATIVE
PH UA: 6 (ref 5.0–8.0)
Protein, UA: NEGATIVE
RBC UA: NEGATIVE
Spec Grav, UA: 1.025 (ref 1.010–1.025)
UROBILINOGEN UA: 0.2 U/dL

## 2017-07-25 MED ORDER — CEPHALEXIN 250 MG PO CAPS: 250.0000 mg | ORAL_CAPSULE | Freq: Two times a day (BID) | ORAL | 0 refills | Status: DC

## 2017-07-25 NOTE — Patient Instructions (Signed)
Drink lots of water and avoid other beverages  Take the keflex as directed   We will alert you when culture returns and make a plan from there

## 2017-07-25 NOTE — Progress Notes (Signed)
Subjective:    Patient ID: Candice Mcgrath, female    DOB: 1976-08-12, 41 y.o.   MRN: [id number]  HPI Here for urinary symptoms Started just over a week   She used to get utis quite often   41 yo pt of Dr Lorelei Pont   C/o of urinary frequency and abdominal pressure   A little itchy with clear vaginal d/c as well  Urgency to urinate (no incontinence)  No dysuria  No blood in urine  A little flank pain -not bad   H/o kidney stones in the past -does not feel like she has one   Some nausea  No vomiting  This afternoon she felt feverish but did not have a fever     Results for orders placed or performed in visit on 07/25/17  POCT Urinalysis Dipstick (Automated)  Result Value Ref Range   Color, UA yellow    Clarity, UA clear    Glucose, UA negative    Bilirubin, UA negative    Ketones, UA negative    Spec Grav, UA 1.025 1.010 - 1.025   Blood, UA negative    pH, UA 6.0 5.0 - 8.0   Protein, UA negative    Urobilinogen, UA 0.2 0.2 or 1.0 E.U./dL   Nitrite, UA negative    Leukocytes, UA Negative Negative    Drinking lots of water  Urine does have an odor   She has had a few bubble baths in the past   Sometimes sexual activity brings on utis  She is active  Also trying to get pregnant for 3 y   Has regular menses  LMP was 9/12   Patient Active Problem List   Diagnosis Date Noted  . Urine frequency 07/25/2017  . Generalized anxiety disorder 09/02/2014  . Major depressive disorder, recurrent episode, in partial remission ([place]) 05/18/2014  . Personal history of drug dependence ([place]) 12/24/2012  . Migraine headache 12/24/2012  . SCIATICA 11/02/2010  . ASTHMA 06/23/2010  . IBS 12/13/2009  . HYPERLIPIDEMIA 11/08/2009  . ADD 11/08/2009  . ALLERGIC RHINITIS 11/01/2008  . NEPHROLITHIASIS, HX OF 11/01/2008  . GENITAL HERPES 10/30/2008  . Personal history of alcoholism ([place]) 10/30/2008   Past Medical History:  Diagnosis Date  . Allergic rhinitis   . Drug  abuse ([place])    adderrall, Xanax, MJ, others, no IV  . Elevated BP   . ETOH abuse    initially sober 2007, [facility]  . Headache(784.0)   . Herpes   . Hyperlipidemia   . IBS (irritable bowel syndrome)   . Migraine headache 12/24/2012  . Nephrolithiasis   . Obesity   . Pre-diabetes    Past Surgical History:  Procedure Laterality Date  . Basket Surgery  2005, 06   kidney stones  . TYMPANOSTOMY TUBE PLACEMENT Bilateral    as a child   Social History  Substance Use Topics  . Smoking status: Former Smoker    Quit date: 06/24/2016  . Smokeless tobacco: Former Systems developer    Quit date: 12/04/2008  . Alcohol use 1.2 - 2.4 oz/week    2 - 4 Cans of beer per week     Comment: recovering AA   Family History  Problem Relation Age of Onset  . Stroke Maternal Grandmother   . Cancer Maternal Grandfather   . Heart disease Paternal Grandfather   . Alcohol abuse Other        GP  . Breast cancer Cousin   . Stroke Other  GGM   Allergies  Allergen Reactions  . Sulfamethoxazole-Trimethoprim     REACTION: itching   Current Outpatient Prescriptions on File Prior to Visit  Medication Sig Dispense Refill  . albuterol (PROAIR HFA) 108 (90 Base) MCG/ACT inhaler INHALE 2 PUFFS INTO THE LUNGS EVERY 4 (FOUR) HOURS AS NEEDED. USE SPARINGLY 8.5 each 4  . azelastine (ASTELIN) 0.1 % nasal spray Place 1 spray into both nostrils 2 (two) times daily. Use in each nostril as directed    . EPINEPHrine 0.3 mg/0.3 mL IJ SOAJ injection USE UTD PRF SYSTEMATIC REACTION.  1  . FLOVENT HFA 110 MCG/ACT inhaler INL 2 PFS PO BID  6  . fluticasone (FLONASE) 50 MCG/ACT nasal spray Place 1 spray into both nostrils daily. 16 g 5  . hyoscyamine (LEVSIN SL) 0.125 MG SL tablet Place 1 tablet (0.125 mg total) under the tongue every 4 (four) hours as needed. 30 tablet 11  . levocetirizine (XYZAL) 5 MG tablet TK 1 T PO QD IN THE EVE  3  . lidocaine-prilocaine (EMLA) cream APPLY TOPICALLY 4 (FOUR) TIMES DAILY AS NEEDED.  FOUR TIMES A DAY TO AFFECTED AREA, 1 MONTH SUPPLY 30 g 2  . PAZEO 0.7 % SOLN   3  . STRATTERA 40 MG capsule TAKE 1 CAPSULE BY MOUTH TWICE DAILY 60 capsule 5  . tiZANidine (ZANAFLEX) 4 MG tablet TAKE 1 TABLET AT BEDTIME 30 tablet 2  . triamcinolone cream (KENALOG) 0.1 % Apply 1 application topically 2 (two) times daily. 454 g 0   No current facility-administered medications on file prior to visit.     Review of Systems  Constitutional: Positive for fatigue. Negative for activity change, appetite change and fever.  HENT: Negative for congestion and sore throat.   Eyes: Negative for itching and visual disturbance.  Respiratory: Negative for cough and shortness of breath.   Cardiovascular: Negative for leg swelling.  Gastrointestinal: Negative for abdominal distention, abdominal pain, constipation, diarrhea and nausea.  Endocrine: Negative for cold intolerance and polydipsia.  Genitourinary: Positive for dysuria, frequency and urgency. Negative for difficulty urinating, flank pain, hematuria, pelvic pain and vaginal bleeding.  Musculoskeletal: Negative for myalgias.  Skin: Negative for rash.  Allergic/Immunologic: Negative for immunocompromised state.  Neurological: Negative for dizziness and weakness.  Hematological: Negative for adenopathy.       Objective:   Physical Exam  Constitutional: She appears well-developed and well-nourished. No distress.  obese and well appearing   HENT:  Head: Normocephalic and atraumatic.  Eyes: Pupils are equal, round, and reactive to light. Conjunctivae and EOM are normal.  Neck: Normal range of motion. Neck supple.  Cardiovascular: Normal rate, regular rhythm and normal heart sounds.   Pulmonary/Chest: Effort normal and breath sounds normal.  Abdominal: Soft. Bowel sounds are normal. She exhibits no distension. There is tenderness. There is no rebound.  No cva tenderness  Mild suprapubic tenderness  Musculoskeletal: She exhibits no edema.    Lymphadenopathy:    She has no cervical adenopathy.  Neurological: She is alert.  Skin: No rash noted.  Psychiatric: She has a normal mood and affect.          Assessment & Plan:   Problem List Items Addressed This Visit      Other   Urine frequency    With hx of uti but bland ua  Pending cx  Cover with 3 d of keflex given symptoms  Disc poss of vaginitis or urethritis Disc stopping all beverages but water and also avoiding bubble  baths or soap in genital area Further plan to follow cx result        Other Visit Diagnoses    Urinary frequency    -  Primary   Relevant Orders   POCT Urinalysis Dipstick (Automated) (Completed)   Urine Culture

## 2017-07-26 ENCOUNTER — Encounter: Payer: Self-pay | Admitting: Family Medicine

## 2017-07-26 ENCOUNTER — Other Ambulatory Visit: Payer: Self-pay | Admitting: Family Medicine

## 2017-07-26 LAB — URINE CULTURE
MICRO NUMBER:: 81098475
Result:: NO GROWTH
SPECIMEN QUALITY:: ADEQUATE

## 2017-07-26 NOTE — Assessment & Plan Note (Signed)
With hx of uti but bland ua  Pending cx  Cover with 3 d of keflex given symptoms  Disc poss of vaginitis or urethritis Disc stopping all beverages but water and also avoiding bubble baths or soap in genital area Further plan to follow cx result

## 2017-07-26 NOTE — Telephone Encounter (Signed)
Last office visit 07/25/2017 with Dr. Glori Bickers.  Last refilled 03/28/2017 for #30 with 2 refills.  Ok to refill?

## 2017-09-26 ENCOUNTER — Encounter: Payer: Self-pay | Admitting: Family Medicine

## 2017-09-27 ENCOUNTER — Other Ambulatory Visit: Payer: Self-pay | Admitting: Family Medicine

## 2017-09-27 MED ORDER — AMITRIPTYLINE HCL 10 MG PO TABS: 10.0000 mg | ORAL_TABLET | Freq: Every day | ORAL | 5 refills | Status: DC

## 2017-09-27 MED ORDER — ESCITALOPRAM OXALATE 10 MG PO TABS: 10.0000 mg | ORAL_TABLET | Freq: Every day | ORAL | 5 refills | Status: DC

## 2017-09-27 MED ORDER — VALACYCLOVIR HCL 500 MG PO TABS: 500.0000 mg | ORAL_TABLET | Freq: Two times a day (BID) | ORAL | 5 refills | Status: DC

## 2017-10-10 ENCOUNTER — Other Ambulatory Visit: Payer: Self-pay | Admitting: *Deleted

## 2017-10-10 MED ORDER — TIZANIDINE HCL 4 MG PO TABS: ORAL_TABLET | ORAL | 0 refills | Status: DC

## 2017-10-10 NOTE — Telephone Encounter (Signed)
Last office visit 07/25/2017 with Dr. Glori Bickers for urinary frequency.  Last refilled 07/27/2017 for #30 with 2 refills.  Pharmacy is requesting 90 day supply.  Ok to refill?

## 2017-10-29 ENCOUNTER — Other Ambulatory Visit: Payer: Self-pay | Admitting: *Deleted

## 2017-10-29 MED ORDER — ESCITALOPRAM OXALATE 10 MG PO TABS: 10.0000 mg | ORAL_TABLET | Freq: Every day | ORAL | 1 refills | Status: DC

## 2017-10-29 MED ORDER — AMITRIPTYLINE HCL 10 MG PO TABS: 10.0000 mg | ORAL_TABLET | Freq: Every day | ORAL | 1 refills | Status: DC

## 2017-10-29 NOTE — Telephone Encounter (Signed)
Pharmacy requested 90 day supply.

## 2017-10-29 NOTE — Addendum Note (Signed)
Addended by: Carter Kitten on: 10/29/2017 03:35 PM   Modules accepted: Orders

## 2017-12-02 ENCOUNTER — Other Ambulatory Visit: Payer: Self-pay | Admitting: Family Medicine

## 2017-12-03 NOTE — Telephone Encounter (Signed)
Last office visit 07/25/2017 with Dr. Glori Bickers.  Last refilled 12/05/2016 for #60 with 5 refills.  Ok to refill?

## 2018-01-22 ENCOUNTER — Other Ambulatory Visit: Payer: Self-pay | Admitting: Family Medicine

## 2018-01-22 NOTE — Telephone Encounter (Signed)
Last office visit 07/25/2017 with Dr. Glori Bickers for Urinary Frequency.  Last refilled 12/19/018 for #90 with no refills.  Ok to refill?

## 2018-01-23 ENCOUNTER — Encounter: Payer: Self-pay | Admitting: Family Medicine

## 2018-01-23 ENCOUNTER — Other Ambulatory Visit: Payer: Self-pay

## 2018-01-23 ENCOUNTER — Ambulatory Visit: Payer: BC Managed Care – PPO | Admitting: Family Medicine

## 2018-01-23 VITALS — BP 106/66 | HR 86 | Temp 98.4°F | Ht 61.5 in | Wt 228.5 lb

## 2018-01-23 DIAGNOSIS — H6980 Other specified disorders of Eustachian tube, unspecified ear: Secondary | ICD-10-CM

## 2018-01-23 NOTE — Progress Notes (Signed)
Dr. Frederico Hamman T. Rhian Asebedo, MD, [facility] Primary [facility] and Sports Medicine [place], [address] Phone: [phone] Fax: [phone]  01/23/2018  Patient: Candice Mcgrath, MRN: [id number], DOB: 08-Sep-1976, 42 y.o.  Primary Physician:  Owens Loffler, MD   Chief Complaint  Patient presents with  . Nasal Congestion  . Ears feel Stuffy  . Bit Tongue   Subjective:   Candice Mcgrath is a 42 y.o. 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  She does not have any specific injury illness that she can recall, she does have baseline allergies in the spring most of the time.  She is not having any focal severe ear pain on hearing loss.  She is having some stuffiness and eye itchiness overall.  Past Medical History, Surgical History, Social History, Family History, Problem List, Medications, and Allergies have been reviewed and updated if relevant.  Patient Active Problem List   Diagnosis Date Noted  . Generalized anxiety disorder 09/02/2014    Priority: Medium  . Major depressive disorder, recurrent episode, in partial remission ([place]) 05/18/2014    Priority: Medium  . Personal history of drug dependence ([place]) 12/24/2012    Priority: Medium  . Migraine headache 12/24/2012    Priority: Medium  . ASTHMA 06/23/2010    Priority: Medium  . Personal history of alcoholism ([place]) 10/30/2008    Priority: Medium  . Urine frequency 07/25/2017  . SCIATICA 11/02/2010  . IBS 12/13/2009  . HYPERLIPIDEMIA 11/08/2009  . ADD 11/08/2009  . ALLERGIC RHINITIS 11/01/2008  . NEPHROLITHIASIS, HX OF 11/01/2008  . GENITAL HERPES 10/30/2008    Past Medical History:  Diagnosis Date  . Allergic rhinitis   . Drug abuse ([place])    adderrall, Xanax, MJ, others, no IV  . Elevated BP   . ETOH abuse    initially sober 2007, [facility]  . Headache(784.0)   . Herpes   .  Hyperlipidemia   . IBS (irritable bowel syndrome)   . Migraine headache 12/24/2012  . Nephrolithiasis   . Obesity   . Pre-diabetes     Past Surgical History:  Procedure Laterality Date  . Basket Surgery  2005, 06   kidney stones  . TYMPANOSTOMY TUBE PLACEMENT Bilateral    as a child    Social History   Socioeconomic History  . Marital status: Married    Spouse name: Not on file  . Number of children: Not on file  . Years of education: Not on file  . Highest education level: Not on file  Occupational History  . 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 Financial resource strain: Not on file  . Food insecurity:    Worry: Not on file    Inability: Not on file  . Transportation needs:    Medical: Not on file    Non-medical: Not on file  Tobacco Use  . Smoking status: Former Smoker    Last attempt to quit: 06/24/2016    Years since quitting: 1.5  . Smokeless tobacco: Former Systems developer    Quit date: 12/04/2008  Substance and Sexual Activity  . Alcohol use: Yes    Alcohol/week: 1.2 - 2.4 oz    Types: 2 - 4 Cans of beer per week    Comment: recovering AA  . Drug use: No  Comment: Recovering NA  . Sexual activity: Not on file  Lifestyle  . Physical activity:    Days per week: Not on file    Minutes per session: Not on file  . Stress: Not on file  Relationships  . Social connections:    Talks on phone: Not on file    Gets together: Not on file    Attends religious service: Not on file    Active member of club or organization: Not on file    Attends meetings of clubs or organizations: Not on file    Relationship status: Not on file  . Intimate partner violence:    Fear of current or ex partner: Not on file    Emotionally abused: Not on file    Physically abused: Not on file    Forced sexual activity: Not on file  Other Topics Concern  . Not on file  Social History Narrative  . Not on file    Family  History  Problem Relation Age of Onset  . Stroke Maternal Grandmother   . Cancer Maternal Grandfather   . Heart disease Paternal Grandfather   . Alcohol abuse Other        GP  . Breast cancer Cousin   . Stroke Other        GGM    Allergies  Allergen Reactions  . Sulfamethoxazole-Trimethoprim     REACTION: itching    Medication list reviewed and updated in full in [facility].  ROS: GEN: Acute illness details above GI: Tolerating PO intake GU: maintaining adequate hydration and urination Pulm: No SOB Interactive and getting along well at home.  Otherwise, ROS is as per the HPI.  Objective:   BP 106/66   Pulse 86   Temp 98.4 F (36.9 C) (Oral)   Ht 5' 1.5" (1.562 m)   Wt 228 lb 8 oz (103.6 kg)   LMP 01/04/2018   BMI 42.48 kg/m    Gen: WDWN, NAD; A & O x3, cooperative. Pleasant.Globally Non-toxic HEENT: Normocephalic and atraumatic. Throat clear, w/o exudate, R TM clear fluid, L TM - good landmarks, + fluid present. rhinnorhea.  MMM Frontal sinuses: NT Max sinuses: NT NECK: Anterior cervical  LAD is absent CV: RRR, No M/G/R, cap refill <2 sec PULM: Breathing comfortably in no respiratory distress. no wheezing, crackles, rhonchi EXT: No c/c/e PSYCH: Friendly, good eye contact MSK: Nml gait     Laboratory and Imaging Data:  Assessment and Plan:   ETD (Eustachian tube dysfunction), unspecified laterality  Supportive care  Eustachian Tube Dysfunction: There is a tube that connects between the sinuses and behind the ear called the "eustachian tube." Sometimes when you have allergies, a cold, or nasal congestion for any reason this tube can get blocked and pressure cannot equalize in your ears. (Like if you swim in deep water) This can also trap fluid behind the ear and give you a full, pressure-like sensation that is uncomfortable, but it is not an ear infection.  Recommendations: Afrin Nasal Spray: 2 sprays twice a day for a maximum of 3-4 days (longer  than this and your nose gets addicted, and you have rebound swelling that makes it worse.) Sudafed: Either pseudoephedrine or phenylephrine. As directed on box. (Not is heart problems or high blood pressure) Anti-Histamine: Allegra, Zyrtec, or Claritin. All over the counter now and once a day.  Nasal steroid. Nasacort is over the counter now. About 10 prescription ones exist.  If you develop fever > 100.4,  then things can change fluid behind the ear does increase your risk of developing an ear infection.   Follow-up: No follow-ups on file.  Signed,  Maud Deed. Denisia Harpole, MD   Allergies as of 01/23/2018      Reactions   Sulfamethoxazole-trimethoprim    REACTION: itching      Medication List        Accurate as of 01/23/18 11:59 PM. Always use your most recent med list.          albuterol 108 (90 Base) MCG/ACT inhaler Commonly known as:  PROAIR HFA INHALE 2 PUFFS INTO THE LUNGS EVERY 4 (FOUR) HOURS AS NEEDED. USE SPARINGLY   amitriptyline 10 MG tablet Commonly known as:  ELAVIL Take 1 tablet (10 mg total) by mouth at bedtime.   atomoxetine 40 MG capsule Commonly known as:  STRATTERA TAKE 1 CAPSULE BY MOUTH TWICE DAILY   azelastine 0.1 % nasal spray Commonly known as:  ASTELIN Place 1 spray into both nostrils 2 (two) times daily. Use in each nostril as directed   EPINEPHrine 0.3 mg/0.3 mL Soaj injection Commonly known as:  EPI-PEN USE UTD PRF SYSTEMATIC REACTION.   escitalopram 10 MG tablet Commonly known as:  LEXAPRO Take 1 tablet (10 mg total) by mouth daily.   FLOVENT HFA 110 MCG/ACT inhaler Generic drug:  fluticasone INL 2 PFS PO BID   fluticasone 50 MCG/ACT nasal spray Commonly known as:  FLONASE Place 1 spray into both nostrils daily.   hyoscyamine 0.125 MG SL tablet Commonly known as:  LEVSIN SL Place 1 tablet (0.125 mg total) under the tongue every 4 (four) hours as needed.   levocetirizine 5 MG tablet Commonly known as:  XYZAL TK 1 T PO QD IN THE  EVE   lidocaine-prilocaine cream Commonly known as:  EMLA APPLY TOPICALLY 4 (FOUR) TIMES DAILY AS NEEDED. FOUR TIMES A DAY TO AFFECTED AREA, 1 MONTH SUPPLY   PAZEO 0.7 % Soln Generic drug:  Olopatadine HCl   tiZANidine 4 MG tablet Commonly known as:  ZANAFLEX TAKE 1 TABLET BY MOUTH EVERYDAY AT BEDTIME   triamcinolone cream 0.1 % Commonly known as:  KENALOG Apply 1 application topically 2 (two) times daily.   valACYclovir 500 MG tablet Commonly known as:  VALTREX Take 1 tablet (500 mg total) by mouth 2 (two) times daily. For 3 days during a flare

## 2018-01-30 ENCOUNTER — Other Ambulatory Visit: Payer: Self-pay | Admitting: Family Medicine

## 2018-01-30 ENCOUNTER — Ambulatory Visit: Payer: Self-pay

## 2018-01-30 DIAGNOSIS — M25572 Pain in left ankle and joints of left foot: Secondary | ICD-10-CM

## 2018-02-14 ENCOUNTER — Ambulatory Visit
Admission: RE | Admit: 2018-02-14 | Discharge: 2018-02-14 | Disposition: A | Discharge status: Home / Self Care | Payer: BC Managed Care – PPO | Source: Ambulatory Visit | Attending: Allergy | Admitting: Allergy

## 2018-02-14 ENCOUNTER — Other Ambulatory Visit: Payer: Self-pay | Admitting: Allergy

## 2018-02-14 DIAGNOSIS — J209 Acute bronchitis, unspecified: Secondary | ICD-10-CM

## 2018-04-17 ENCOUNTER — Other Ambulatory Visit: Payer: Self-pay | Admitting: Family Medicine

## 2018-04-17 MED ORDER — CIPROFLOXACIN HCL 500 MG PO TABS: 500.0000 mg | ORAL_TABLET | Freq: Two times a day (BID) | ORAL | 0 refills | Status: DC

## 2018-04-17 NOTE — Telephone Encounter (Signed)
done

## 2018-04-17 NOTE — Telephone Encounter (Signed)
Copied from [place] [id number]. Topic: Quick Communication - Rx Refill/Question >> Apr 17, 2018 11:39 AM Scherrie Gerlach wrote: Medication: cipro Pt states she is going on a mission trip to [place] and was advised to take this med with her.  Pt states the dr gave this Rx to her last year to take, but she cannot find. Pt is leaving this Friday. [facility] [place], [address] [phone] (Phone) [phone] (Fax)

## 2018-05-02 ENCOUNTER — Other Ambulatory Visit: Payer: Self-pay | Admitting: Family Medicine

## 2018-05-02 NOTE — Telephone Encounter (Signed)
Last office visit 01/23/2018 for ETD.  CPE scheduled for 05/20/2018.  Last refilled 01/22/2018 for #90 with no refills.  Ok to refill?

## 2018-05-03 ENCOUNTER — Telehealth (INDEPENDENT_AMBULATORY_CARE_PROVIDER_SITE_OTHER): Payer: BC Managed Care – PPO | Admitting: Family Medicine

## 2018-05-03 ENCOUNTER — Other Ambulatory Visit (INDEPENDENT_AMBULATORY_CARE_PROVIDER_SITE_OTHER): Payer: BC Managed Care – PPO

## 2018-05-03 DIAGNOSIS — E782 Mixed hyperlipidemia: Secondary | ICD-10-CM | POA: Diagnosis not present

## 2018-05-03 DIAGNOSIS — Z130 Encounter for screening for diseases of the blood and blood-forming organs and certain disorders involving the immune mechanism: Secondary | ICD-10-CM | POA: Diagnosis not present

## 2018-05-03 DIAGNOSIS — Z1329 Encounter for screening for other suspected endocrine disorder: Secondary | ICD-10-CM

## 2018-05-03 LAB — CBC WITH DIFFERENTIAL/PLATELET
BASOS ABS: 0.1 10*3/uL (ref 0.0–0.1)
Basophils Relative: 1.3 % (ref 0.0–3.0)
Eosinophils Absolute: 0.3 10*3/uL (ref 0.0–0.7)
Eosinophils Relative: 3 % (ref 0.0–5.0)
HCT: 41 % (ref 36.0–46.0)
Hemoglobin: 13.7 g/dL (ref 12.0–15.0)
LYMPHS ABS: 2.2 10*3/uL (ref 0.7–4.0)
Lymphocytes Relative: 21.4 % (ref 12.0–46.0)
MCHC: 33.4 g/dL (ref 30.0–36.0)
MCV: 99.5 fl (ref 78.0–100.0)
MONO ABS: 0.7 10*3/uL (ref 0.1–1.0)
MONOS PCT: 7 % (ref 3.0–12.0)
NEUTROS PCT: 67.3 % (ref 43.0–77.0)
Neutro Abs: 7 10*3/uL (ref 1.4–7.7)
Platelets: 325 10*3/uL (ref 150.0–400.0)
RBC: 4.12 Mil/uL (ref 3.87–5.11)
RDW: 14.5 % (ref 11.5–15.5)
WBC: 10.4 10*3/uL (ref 4.0–10.5)

## 2018-05-03 LAB — COMPREHENSIVE METABOLIC PANEL
ALT: 23 U/L (ref 0–35)
AST: 18 U/L (ref 0–37)
Albumin: 3.9 g/dL (ref 3.5–5.2)
Alkaline Phosphatase: 80 U/L (ref 39–117)
BUN: 11 mg/dL (ref 6–23)
CALCIUM: 9.1 mg/dL (ref 8.4–10.5)
CHLORIDE: 101 meq/L (ref 96–112)
CO2: 25 meq/L (ref 19–32)
CREATININE: 0.84 mg/dL (ref 0.40–1.20)
GFR: 79.58 mL/min (ref >60.00)
Glucose, Bld: 115 mg/dL — ABNORMAL HIGH (ref 70–99)
Potassium: 4.5 mEq/L (ref 3.5–5.1)
Sodium: 136 mEq/L (ref 135–145)
Total Bilirubin: 0.3 mg/dL (ref 0.2–1.2)
Total Protein: 6.6 g/dL (ref 6.0–8.3)

## 2018-05-03 LAB — LIPID PANEL
CHOL/HDL RATIO: 3
Cholesterol: 168 mg/dL (ref 0–200)
HDL: 49.5 mg/dL (ref >39.00)
LDL CALC: 87 mg/dL (ref 0–99)
NONHDL: 118.36
TRIGLYCERIDES: 157 mg/dL — AB (ref 0.0–149.0)
VLDL: 31.4 mg/dL (ref 0.0–40.0)

## 2018-05-03 LAB — TSH: TSH: 3.99 u[IU]/mL (ref 0.35–4.50)

## 2018-05-03 NOTE — Telephone Encounter (Signed)
Labs entered for Dr. Loletha Grayer

## 2018-05-08 ENCOUNTER — Encounter: Payer: Self-pay | Admitting: Family Medicine

## 2018-05-20 ENCOUNTER — Encounter: Payer: Self-pay | Admitting: Family Medicine

## 2018-05-20 ENCOUNTER — Ambulatory Visit (INDEPENDENT_AMBULATORY_CARE_PROVIDER_SITE_OTHER): Payer: BC Managed Care – PPO | Admitting: Family Medicine

## 2018-05-20 VITALS — BP 110/70 | HR 94 | Temp 98.4°F | Ht 61.0 in | Wt 222.0 lb

## 2018-05-20 DIAGNOSIS — C4441 Basal cell carcinoma of skin of scalp and neck: Secondary | ICD-10-CM

## 2018-05-20 DIAGNOSIS — Z0000 Encounter for general adult medical examination without abnormal findings: Secondary | ICD-10-CM | POA: Diagnosis not present

## 2018-05-20 NOTE — Progress Notes (Signed)
Dr. Frederico Hamman T. Fumi Guadron, MD, [facility] Medicine [place], [address] Phone: [phone] Fax: [phone]  05/20/2018  Patient: Candice Mcgrath, MRN: [id number], DOB: 07/31/1976, 42 y.o.  Primary Physician:  Owens Loffler, MD   Chief Complaint  Patient presents with  . Annual Exam   Subjective:   Candice Mcgrath is a 42 y.o. pleasant patient who presents with the following:  Health Maintenance Summary Reviewed and updated, unless pt declines services.  Tobacco History Reviewed. + smoker Alcohol: couple of beers a day Exercise Habits: Some activity, rec at least 30 mins 5 times a week STD concerns: none Drug Use: Some CBD.  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  Beach body on demand.   Shoulder.   Smoking some now.  1/2 ppd  Health Maintenance  Topic Date Due  . INFLUENZA VACCINE  05/23/2018  . PAP SMEAR  06/07/2018  . TETANUS/TDAP  04/05/2027  . HIV Screening  Completed    Immunization History  Administered Date(s) Administered  . Hep A / Hep B 04/04/2017  . Influenza, Seasonal, Injecte, Preservative Fre 09/02/2014  . Influenza,inj,Quad PF,6+ Mos 07/10/2013, 07/24/2016  . Tdap 04/04/2017   Patient Active Problem List   Diagnosis Date Noted  . Generalized anxiety disorder 09/02/2014    Priority: Medium  . Major depressive disorder, recurrent episode, in partial remission ([place]) 05/18/2014    Priority: Medium  . Personal history of drug dependence ([place]) 12/24/2012    Priority: Medium  . Migraine headache 12/24/2012    Priority: Medium  . ASTHMA 06/23/2010    Priority: Medium  . Personal history of alcoholism ([place]) 10/30/2008    Priority: Medium  . Urine frequency 07/25/2017  . SCIATICA 11/02/2010  . IBS 12/13/2009  . Hyperlipidemia  11/08/2009  . ADD 11/08/2009  . ALLERGIC RHINITIS 11/01/2008  . NEPHROLITHIASIS, HX OF 11/01/2008  . GENITAL HERPES 10/30/2008   Past Medical History:  Diagnosis Date  . Allergic rhinitis   . Drug abuse ([place])    adderrall, Xanax, MJ, others, no IV  . Elevated BP   . ETOH abuse    initially sober 2007, [facility]  . Headache(784.0)   . Herpes   . Hyperlipidemia   . IBS (irritable bowel syndrome)   . Migraine headache 12/24/2012  . Nephrolithiasis   . Obesity   . Pre-diabetes    Past Surgical History:  Procedure Laterality Date  . Basket Surgery  2005, 06   kidney stones  . TYMPANOSTOMY TUBE PLACEMENT Bilateral    as a child   Social History   Socioeconomic History  . Marital status: Married    Spouse name: Not on file  . Number of children: Not on file  . Years of education: Not on file  . Highest education level: Not on file  Occupational History  . Occupation: spanish    Employer: Psychologist, sport and exercise [employer]    Comment: @ [place]  . Financial resource strain: Not on file  . Food insecurity:    Worry: Not on file    Inability: Not on file  . Transportation needs:    Medical: Not on file    Non-medical: Not on file  Tobacco Use  . Smoking status: Former Smoker    Last attempt to quit: 06/24/2016  Years since quitting: 1.9  . Smokeless tobacco: Former Systems developer    Quit date: 12/04/2008  Substance and Sexual Activity  . Alcohol use: Yes    Alcohol/week: 1.2 - 2.4 oz    Types: 2 - 4 Cans of beer per week    Comment: recovering AA  . Drug use: No    Comment: Recovering NA  . Sexual activity: Not on file  Lifestyle  . Physical activity:    Days per week: Not on file    Minutes per session: Not on file  . Stress: Not on file  Relationships  . Social connections:    Talks on phone: Not on file    Gets together: Not on file    Attends religious service: Not on file    Active member of club or organization: Not on file    Attends  meetings of clubs or organizations: Not on file    Relationship status: Not on file  . Intimate partner violence:    Fear of current or ex partner: Not on file    Emotionally abused: Not on file    Physically abused: Not on file    Forced sexual activity: Not on file  Other Topics Concern  . Not on file  Social History Narrative  . Not on file   Family History  Problem Relation Age of Onset  . Stroke Maternal Grandmother   . Cancer Maternal Grandfather   . Heart disease Paternal Grandfather   . Alcohol abuse Other        GP  . Breast cancer Cousin   . Stroke Other        GGM   Allergies  Allergen Reactions  . Sulfamethoxazole-Trimethoprim     REACTION: itching    Medication list has been reviewed and updated.   General: Denies fever, chills, sweats. No significant weight loss. Eyes: Denies blurring,significant itching ENT: Denies earache, sore throat, and hoarseness.  Cardiovascular: Denies chest pains, palpitations, dyspnea on exertion,  Respiratory: Denies cough, dyspnea at rest,wheeezing Breast: no concerns about lumps GI: Denies nausea, vomiting, diarrhea, constipation, change in bowel habits, abdominal pain, melena, hematochezia GU: Denies dysuria, hematuria, urinary hesitancy, nocturia, denies STD risk, no concerns about discharge Musculoskeletal: Denies back pain, joint pain Derm: Denies rash, itching Neuro: Denies  paresthesias, frequent falls, frequent headaches Psych: Denies depression, anxiety Endocrine: Denies cold intolerance, heat intolerance, polydipsia Heme: Denies enlarged lymph nodes Allergy: No hayfever  Objective:   BP 110/70   Pulse 94   Temp 98.4 F (36.9 C) (Oral)   Ht '5\' 1"'  (1.549 m)   Wt 222 lb (100.7 kg)   LMP 05/01/2018   BMI 41.95 kg/m  No exam data present  GEN: well developed, well nourished, no acute distress Eyes: conjunctiva and lids normal, PERRLA, EOMI ENT: TM clear, nares clear, oral exam WNL Neck: supple, no  lymphadenopathy, no thyromegaly, no JVD Pulm: clear to auscultation and percussion, respiratory effort normal CV: regular rate and rhythm, S1-S2, no murmur, rub or gallop, no bruits Chest: no scars, masses, no lumps BREAST: breast exam declined GI: soft, non-tender; no hepatosplenomegaly, masses; active bowel sounds all quadrants GU: GU exam declined Lymph: no cervical, axillary or inguinal adenopathy MSK: gait normal, muscle tone and strength WNL, no joint swelling, effusions, discoloration, crepitus  SKIN: skin lesion at the base of neck, 1 1/2 cm elevated and pearly Neuro: normal mental status, normal strength, sensation, and motion Psych: alert; oriented to person, place and time, normally interactive and  not anxious or depressed in appearance.   All labs reviewed with patient. Lipids:    Component Value Date/Time   CHOL 168 05/03/2018 1439   TRIG 157.0 (H) 05/03/2018 1439   HDL 49.50 05/03/2018 1439   VLDL 31.4 05/03/2018 1439   CHOLHDL 3 05/03/2018 1439   CBC: CBC Latest Ref Rng & Units 05/03/2018 04/19/2017 07/03/2013  WBC 4.0 - 10.5 K/uL 10.4 8.7 11.1(H)  Hemoglobin 12.0 - 15.0 g/dL 13.7 14.1 14.2  Hematocrit 36.0 - 46.0 % 41.0 41.3 42.1  Platelets 150.0 - 400.0 K/uL 325.0 285.0 831.5    Basic Metabolic Panel:    Component Value Date/Time   NA 136 05/03/2018 1439   K 4.5 05/03/2018 1439   CL 101 05/03/2018 1439   CO2 25 05/03/2018 1439   BUN 11 05/03/2018 1439   CREATININE 0.84 05/03/2018 1439   GLUCOSE 115 (H) 05/03/2018 1439   CALCIUM 9.1 05/03/2018 1439   Hepatic Function Latest Ref Rng & Units 05/03/2018 04/19/2017 07/03/2013  Total Protein 6.0 - 8.3 g/dL 6.6 7.1 7.2  Albumin 3.5 - 5.2 g/dL 3.9 4.1 3.6  AST 0 - 37 U/L '18 17 18  ' ALT 0 - 35 U/L '23 18 12  ' Alk Phosphatase 39 - 117 U/L 80 77 72  Total Bilirubin 0.2 - 1.2 mg/dL 0.3 0.5 0.4  Bilirubin, Direct 0.0 - 0.3 mg/dL - 0.1 0.0    Lab Results  Component Value Date   TSH 3.99 05/03/2018   No results  found.  Assessment and Plan:   Healthcare maintenance  Basal cell carcinoma (BCC) of left side of neck - Plan: Ambulatory referral to Dermatology  Concern for [facility]  Work on weight and smoking  Health Maintenance Exam: The patient's preventative maintenance and recommended screening tests for an annual wellness exam were reviewed in full today. Brought up to date unless services declined.  Counselled on the importance of diet, exercise, and its role in overall health and mortality. The patient's FH and SH was reviewed, including their home life, tobacco status, and drug and alcohol status.  Follow-up in 1 year for physical exam or additional follow-up below.  Follow-up: No follow-ups on file. Or follow-up in 1 year if not noted.  Signed,  Maud Deed. Tessie Ordaz, MD   Allergies as of 05/20/2018      Reactions   Sulfamethoxazole-trimethoprim    REACTION: itching      Medication List        Accurate as of 05/20/18  1:30 PM. Always use your most recent med list.          albuterol 108 (90 Base) MCG/ACT inhaler Commonly known as:  PROAIR HFA INHALE 2 PUFFS INTO THE LUNGS EVERY 4 (FOUR) HOURS AS NEEDED. USE SPARINGLY   amitriptyline 10 MG tablet Commonly known as:  ELAVIL Take 1 tablet (10 mg total) by mouth at bedtime.   atomoxetine 40 MG capsule Commonly known as:  STRATTERA TAKE 1 CAPSULE BY MOUTH TWICE DAILY   azelastine 0.1 % nasal spray Commonly known as:  ASTELIN Place 1 spray into both nostrils 2 (two) times daily. Use in each nostril as directed   EPINEPHrine 0.3 mg/0.3 mL Soaj injection Commonly known as:  EPI-PEN USE UTD PRF SYSTEMATIC REACTION.   escitalopram 10 MG tablet Commonly known as:  LEXAPRO Take 1 tablet (10 mg total) by mouth daily.   FLOVENT HFA 110 MCG/ACT inhaler Generic drug:  fluticasone INL 2 PFS PO BID   fluticasone 50 MCG/ACT nasal spray  Commonly known as:  FLONASE Place 1 spray into both nostrils daily.   hyoscyamine 0.125 MG  SL tablet Commonly known as:  LEVSIN SL Place 1 tablet (0.125 mg total) under the tongue every 4 (four) hours as needed.   levocetirizine 5 MG tablet Commonly known as:  XYZAL TK 1 T PO QD IN THE EVE   lidocaine-prilocaine cream Commonly known as:  EMLA APPLY TOPICALLY 4 (FOUR) TIMES DAILY AS NEEDED. FOUR TIMES A DAY TO AFFECTED AREA, 1 MONTH SUPPLY   NON FORMULARY CBD oil SL daily   PAZEO 0.7 % Soln Generic drug:  Olopatadine HCl   tiZANidine 4 MG tablet Commonly known as:  ZANAFLEX TAKE 1 TABLET BY MOUTH EVERYDAY AT BEDTIME   triamcinolone cream 0.1 % Commonly known as:  KENALOG Apply 1 application topically 2 (two) times daily.   valACYclovir 500 MG tablet Commonly known as:  VALTREX Take 1 tablet (500 mg total) by mouth 2 (two) times daily. For 3 days during a flare

## 2018-05-20 NOTE — Patient Instructions (Addendum)
REFERRALS TO SPECIALISTS, SPECIAL TESTS (MRI, CT, ULTRASOUNDS)  [facility] or  [facility] will help you. ASK CHECK-IN FOR HELP.  Specialist appointment times vary a great deal, based on their schedule / openings. -- Some specialists have very long wait times. (Example. Dermatology)      You do not need a referral to make a mammogram appointment, and you may call to make her own mammogram appointment directly around your schedule.  MAMMOGRAPHY IN [place]:  [facility] [phone] [place], [place] [address]  [facility] ([facility]) [address] [place], [place] [address] Phone: [phone] Toll Free: [phone]  MAMMOGRAPHY IN [place]:  [facility] or [place]) [phone] Located on the campus of [facility])  [facility] Location) [address]  [place], [place] [address]

## 2018-08-04 ENCOUNTER — Other Ambulatory Visit: Payer: Self-pay | Admitting: Family Medicine

## 2018-08-04 NOTE — Telephone Encounter (Signed)
Last office visit 05/20/18 for CPE. No future appointments. Last refilled 05/02/18 for #90 with no refills.  Ok to refill?

## 2018-08-27 ENCOUNTER — Encounter: Payer: Self-pay | Admitting: Family Medicine

## 2018-08-27 ENCOUNTER — Ambulatory Visit: Payer: BC Managed Care – PPO | Admitting: Family Medicine

## 2018-08-27 VITALS — BP 110/66 | HR 97 | Temp 97.6°F | Ht 61.0 in | Wt 220.2 lb

## 2018-08-27 DIAGNOSIS — M79652 Pain in left thigh: Secondary | ICD-10-CM

## 2018-08-27 MED ORDER — MELOXICAM 15 MG PO TABS: 15.0000 mg | ORAL_TABLET | Freq: Every day | ORAL | 1 refills | Status: DC | PRN

## 2018-08-27 NOTE — Assessment & Plan Note (Signed)
In pt with hx of piriformis syndrome  Pain in IT band area/lateral buttock and hamstring (with tightness)  Disc use of heat/ cold and stretching (handouts given)  meloxicam 15 mg with food prn daily  Tizanidine with caution of sedation (already has at home)  Slow walking /leg elevation  Consider films/PT if no imp  inst to alert [place] if swelling/redness or skin change

## 2018-08-27 NOTE — Patient Instructions (Addendum)
Try cold and warm compresses-see what works better/feels better   Take the muscle relaxer to see if helpful  meloxicam 15 mg daily with food   Stretching as tolerated  IT band and hamstring   Keep moving but do not overdo it   Let us know in a few days if not improving

## 2018-08-27 NOTE — Progress Notes (Signed)
Subjective:    Patient ID: Candice Mcgrath, female    DOB: [birth year], 42 y.o.   MRN: [id number]  HPI  Here for L leg pain and tightness  42 yo pt of Dr Lorelei Pont  Non smoker   Wt Readings from Last 3 Encounters:  08/27/18 220 lb 4 oz (99.9 kg)  05/20/18 222 lb (100.7 kg)  01/23/18 228 lb 8 oz (103.6 kg)   Has a pain in her right groin and buttock area  Has a hx of piriformis syndrome  This feels different  Makes it painful to wear weight   Pain does not radiate down leg  Dull ache / moderate / sharp to stand up  No swelling or redness  ? Strained something  Woke up with it   Has been going to the gym (generally) -not over the past week due to schedule   occ back soreness/ goes to chiropractor which is very helpful   Used foam roller and yoga ball  Took 1/2 tizanidine today  No nsaids     BP Readings from Last 3 Encounters:  08/27/18 110/66  05/20/18 110/70  01/23/18 106/66   Pulse Readings from Last 3 Encounters:  08/27/18 97  05/20/18 94  01/23/18 86   Pulse ox 96%  Patient Active Problem List   Diagnosis Date Noted  . Left thigh pain 08/27/2018  . Urine frequency 07/25/2017  . Generalized anxiety disorder 09/02/2014  . Major depressive disorder, recurrent episode, in partial remission ([place]) 05/18/2014  . Personal history of drug dependence ([place]) 12/24/2012  . Migraine headache 12/24/2012  . SCIATICA 11/02/2010  . ASTHMA 06/23/2010  . IBS 12/13/2009  . Hyperlipidemia 11/08/2009  . ADD 11/08/2009  . ALLERGIC RHINITIS 11/01/2008  . NEPHROLITHIASIS, HX OF 11/01/2008  . GENITAL HERPES 10/30/2008  . Personal history of alcoholism ([place]) 10/30/2008   Past Medical History:  Diagnosis Date  . Allergic rhinitis   . Drug abuse ([place])    adderrall, Xanax, MJ, others, no IV  . Elevated BP   . ETOH abuse    initially sober 2007, [facility]  . Headache(784.0)   . Herpes   . Hyperlipidemia   . IBS (irritable bowel syndrome)   . Migraine  headache 12/24/2012  . Nephrolithiasis   . Obesity   . Pre-diabetes    Past Surgical History:  Procedure Laterality Date  . Basket Surgery  2005, 06   kidney stones  . TYMPANOSTOMY TUBE PLACEMENT Bilateral    as a child   Social History   Tobacco Use  . Smoking status: Former Smoker    Last attempt to quit: 06/24/2016    Years since quitting: 2.1  . Smokeless tobacco: Former Systems developer    Quit date: 12/04/2008  Substance Use Topics  . Alcohol use: Yes    Alcohol/week: 2.0 - 4.0 standard drinks    Types: 2 - 4 Cans of beer per week    Comment: recovering AA  . Drug use: No    Comment: Recovering NA   Family History  Problem Relation Age of Onset  . Stroke Maternal Grandmother   . Cancer Maternal Grandfather   . Heart disease Paternal Grandfather   . Alcohol abuse Other        GP  . Breast cancer Cousin   . Stroke Other        GGM   Allergies  Allergen Reactions  . Sulfamethoxazole-Trimethoprim     REACTION: itching   Current Outpatient Medications on File  Prior to Visit  Medication Sig Dispense Refill  . albuterol (PROAIR HFA) 108 (90 Base) MCG/ACT inhaler INHALE 2 PUFFS INTO THE LUNGS EVERY 4 (FOUR) HOURS AS NEEDED. USE SPARINGLY 8.5 each 4  . amitriptyline (ELAVIL) 10 MG tablet Take 1 tablet (10 mg total) by mouth at bedtime. 90 tablet 1  . atomoxetine (STRATTERA) 40 MG capsule TAKE 1 CAPSULE BY MOUTH TWICE DAILY 60 capsule 5  . azelastine (ASTELIN) 0.1 % nasal spray Place 1 spray into both nostrils 2 (two) times daily. Use in each nostril as directed    . EPINEPHrine 0.3 mg/0.3 mL IJ SOAJ injection USE UTD PRF SYSTEMATIC REACTION.  1  . FLOVENT HFA 110 MCG/ACT inhaler INL 2 PFS PO BID  6  . fluticasone (FLONASE) 50 MCG/ACT nasal spray Place 1 spray into both nostrils daily. 16 g 5  . hyoscyamine (LEVSIN SL) 0.125 MG SL tablet Place 1 tablet (0.125 mg total) under the tongue every 4 (four) hours as needed. 30 tablet 11  . levocetirizine (XYZAL) 5 MG tablet TK 1 T PO QD IN  THE EVE  3  . lidocaine-prilocaine (EMLA) cream APPLY TOPICALLY 4 (FOUR) TIMES DAILY AS NEEDED. FOUR TIMES A DAY TO AFFECTED AREA, 1 MONTH SUPPLY 30 g 2  . NON FORMULARY CBD oil SL daily    . PAZEO 0.7 % SOLN   3  . tiZANidine (ZANAFLEX) 4 MG tablet TAKE 1 TABLET BY MOUTH EVERYDAY AT BEDTIME 90 tablet 0  . triamcinolone cream (KENALOG) 0.1 % Apply 1 application topically 2 (two) times daily. 454 g 0  . valACYclovir (VALTREX) 500 MG tablet Take 1 tablet (500 mg total) by mouth 2 (two) times daily. For 3 days during a flare 30 tablet 5   No current facility-administered medications on file prior to visit.     Review of Systems  Constitutional: Negative for activity change, appetite change, fatigue, fever and unexpected weight change.  HENT: Negative for congestion, ear pain, rhinorrhea, sinus pressure and sore throat.   Eyes: Negative for pain, redness and visual disturbance.  Respiratory: Negative for cough, shortness of breath and wheezing.   Cardiovascular: Negative for chest pain and palpitations.  Gastrointestinal: Negative for abdominal pain, blood in stool, constipation and diarrhea.  Endocrine: Negative for polydipsia and polyuria.  Genitourinary: Negative for dysuria, frequency and urgency.  Musculoskeletal: Positive for arthralgias and back pain. Negative for joint swelling and myalgias.       Left thigh pain   Skin: Negative for pallor and rash.  Allergic/Immunologic: Negative for environmental allergies.  Neurological: Negative for dizziness, syncope and headaches.  Hematological: Negative for adenopathy. Does not bruise/bleed easily.  Psychiatric/Behavioral: Negative for decreased concentration and dysphoric mood. The patient is not nervous/anxious.        Objective:   Physical Exam  Constitutional: She appears well-developed and well-nourished. No distress.  obese and well appearing   HENT:  Head: Normocephalic and atraumatic.  Eyes: Pupils are equal, round, and  reactive to light. Conjunctivae and EOM are normal. Right eye exhibits no discharge. Left eye exhibits no discharge. No scleral icterus.  Neck: Normal range of motion. Neck supple.  Cardiovascular: Normal rate, regular rhythm and normal heart sounds.  Pulmonary/Chest: Effort normal and breath sounds normal. No stridor. No respiratory distress. She has no wheezes. She has no rales.  Musculoskeletal: She exhibits no edema, tenderness or deformity.  Tender over L greater trochanter and IT band Also L hamstring  Pain on internal rotation of hip and  flexion (past 90 deg)  Nl rom knee and ankle No leg swelling or erythema/warmth or palp cord Neg homan's sign   LS- no spinous process tenderness Some L piriformis tenderness (and pain on stretch)  Gait favors R leg   Lymphadenopathy:    She has no cervical adenopathy.  Neurological: She is alert. She displays no atrophy and normal reflexes. No cranial nerve deficit or sensory deficit. She exhibits normal muscle tone. Coordination normal.  Nl strength in both LEs  Skin: No rash noted. No erythema.  Psychiatric: She has a normal mood and affect.          Assessment & Plan:   Problem List Items Addressed This Visit      Other   Left thigh pain - Primary    In pt with hx of piriformis syndrome  Pain in IT band area/lateral buttock and hamstring (with tightness)  Disc use of heat/ cold and stretching (handouts given)  meloxicam 15 mg with food prn daily  Tizanidine with caution of sedation (already has at home)  Slow walking /leg elevation  Consider films/PT if no imp  inst to alert [place] if swelling/redness or skin change

## 2018-09-02 ENCOUNTER — Ambulatory Visit
Admission: RE | Admit: 2018-09-02 | Discharge: 2018-09-02 | Disposition: A | Discharge status: Home / Self Care | Payer: BC Managed Care – PPO | Source: Ambulatory Visit | Attending: Family Medicine | Admitting: Family Medicine

## 2018-09-02 ENCOUNTER — Other Ambulatory Visit: Payer: Self-pay | Admitting: Family Medicine

## 2018-09-02 DIAGNOSIS — Z1231 Encounter for screening mammogram for malignant neoplasm of breast: Secondary | ICD-10-CM

## 2018-12-08 ENCOUNTER — Other Ambulatory Visit: Payer: Self-pay | Admitting: Family Medicine

## 2018-12-09 NOTE — Telephone Encounter (Signed)
Last office visit 08/27/2018 with Dr. Glori Bickers for left thigh pain.  Last refilled 08/05/2018 for #90 with no refills.  No future appointments.

## 2018-12-21 ENCOUNTER — Other Ambulatory Visit: Payer: Self-pay | Admitting: Family Medicine

## 2018-12-23 NOTE — Telephone Encounter (Signed)
Last office visit 08/27/2018 with Dr. Glori Bickers for left thigh pain.  Last refilled 12/03/2017 for #60 with 5 refills.  No future appointments.

## 2019-03-07 ENCOUNTER — Encounter: Payer: Self-pay | Admitting: Family Medicine

## 2019-03-11 MED ORDER — BUPROPION HCL ER (XL) 150 MG PO TB24: 150.0000 mg | ORAL_TABLET | Freq: Every day | ORAL | 3 refills | Status: DC

## 2019-03-13 ENCOUNTER — Ambulatory Visit: Payer: BC Managed Care – PPO | Admitting: Family Medicine

## 2019-03-13 ENCOUNTER — Encounter: Payer: Self-pay | Admitting: Family Medicine

## 2019-03-13 ENCOUNTER — Other Ambulatory Visit: Payer: Self-pay

## 2019-03-13 VITALS — BP 112/70 | HR 109 | Temp 97.8°F | Ht 61.0 in | Wt 195.0 lb

## 2019-03-13 DIAGNOSIS — F1020 Alcohol dependence, uncomplicated: Secondary | ICD-10-CM | POA: Diagnosis not present

## 2019-03-13 DIAGNOSIS — F10230 Alcohol dependence with withdrawal, uncomplicated: Secondary | ICD-10-CM

## 2019-03-13 DIAGNOSIS — F10930 Alcohol use, unspecified with withdrawal, uncomplicated: Secondary | ICD-10-CM

## 2019-03-13 MED ORDER — CHLORDIAZEPOXIDE HCL 25 MG PO CAPS: ORAL_CAPSULE | ORAL | 0 refills | Status: DC

## 2019-03-13 NOTE — Progress Notes (Addendum)
Candice Delude T. Levy Cedano, MD Primary Care and [place] at [facility] [place], [address] Phone: [phone]  FAX: [place] - 43 y.o. female  MRN [id number]  Date of Birth: May 07, 1976  Visit Date: 03/13/2019  PCP: Owens Loffler, MD  Referred by: Owens Loffler, MD  Chief Complaint  Patient presents with  . Alcohol Problem   Subjective:   Candice Mcgrath is a 43 y.o. very pleasant female patient who presents with the following:  ETOH - see below  Past Medical History, Surgical History, Social History, Family History, Problem List, Medications, and Allergies have been reviewed and updated if relevant.  Patient Active Problem List   Diagnosis Date Noted  . Generalized anxiety disorder 09/02/2014    Priority: Medium  . Major depressive disorder, recurrent episode, in partial remission ([place]) 05/18/2014    Priority: Medium  . Personal history of drug dependence ([place]) 12/24/2012    Priority: Medium  . Migraine headache 12/24/2012    Priority: Medium  . ASTHMA 06/23/2010    Priority: Medium  . Personal history of alcoholism ([place]) 10/30/2008    Priority: Medium  . Left thigh pain 08/27/2018  . Urine frequency 07/25/2017  . SCIATICA 11/02/2010  . IBS 12/13/2009  . Hyperlipidemia 11/08/2009  . ADD 11/08/2009  . ALLERGIC RHINITIS 11/01/2008  . NEPHROLITHIASIS, HX OF 11/01/2008  . GENITAL HERPES 10/30/2008    Past Medical History:  Diagnosis Date  . Allergic rhinitis   . Drug abuse ([place])    adderrall, Xanax, MJ, others, no IV  . Elevated BP   . ETOH abuse    initially sober 2007, [facility]  . Headache(784.0)   . Herpes   . Hyperlipidemia   . IBS (irritable bowel syndrome)   . Migraine headache 12/24/2012  . Nephrolithiasis   . Obesity   . Pre-diabetes     Past Surgical History:  Procedure Laterality Date  . Basket Surgery  2005, 06   kidney stones  .  TYMPANOSTOMY TUBE PLACEMENT Bilateral    as a child    Social History   Socioeconomic History  . Marital status: Married    Spouse name: Not on file  . Number of children: Not on file  . Years of education: Not on file  . Highest education level: Not on file  Occupational History  . 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 Financial resource strain: Not on file  . Food insecurity:    Worry: Not on file    Inability: Not on file  . Transportation needs:    Medical: Not on file    Non-medical: Not on file  Tobacco Use  . Smoking status: Former Smoker    Last attempt to quit: 06/24/2016    Years since quitting: 2.7  . Smokeless tobacco: Former Systems developer    Quit date: 12/04/2008  Substance and Sexual Activity  . Alcohol use: Yes    Alcohol/week: 2.0 - 4.0 standard drinks    Types: 2 - 4 Cans of beer per week    Comment: recovering AA  . Drug use: No    Comment: Recovering NA  . Sexual activity: Not on file  Lifestyle  . Physical activity:    Days per week: Not on file    Minutes per session: Not on file  . Stress: Not on file  Relationships  .  Social connections:    Talks on phone: Not on file    Gets together: Not on file    Attends religious service: Not on file    Active member of club or organization: Not on file    Attends meetings of clubs or organizations: Not on file    Relationship status: Not on file  . Intimate partner violence:    Fear of current or ex partner: Not on file    Emotionally abused: Not on file    Physically abused: Not on file    Forced sexual activity: Not on file  Other Topics Concern  . Not on file  Social History Narrative  . Not on file    Family History  Problem Relation Age of Onset  . Stroke Maternal Grandmother   . Cancer Maternal Grandfather   . Heart disease Paternal Grandfather   . Alcohol abuse Other        GP  . Breast cancer Cousin   . Stroke Other         GGM    Allergies  Allergen Reactions  . Sulfamethoxazole-Trimethoprim     REACTION: itching    Medication list reviewed and updated in full in [place].   GEN: No acute illnesses, no fevers, chills. Interactive and getting along well at home.  Otherwise, ROS is as per the HPI.  Objective:   BP 112/70   Pulse (!) 109   Temp 97.8 F (36.6 C) (Oral)   Ht 5\' 1"  (1.549 m)   Wt 195 lb (88.5 kg)   BMI 36.84 kg/m   GEN: WDWN, NAD, Non-toxic, A & O x 3 HEENT: Atraumatic, Normocephalic. Neck supple. No masses, No LAD. EXTR: No c/c/e NEURO Normal gait. No current tremor PSYCH: Normally interactive. Conversant. Not depressed or anxious appearing.  Calm demeanor.   Laboratory and Imaging Data:  Assessment and Plan:   Alcoholism ([place])  Alcohol withdrawal syndrome without complication (HCC)  >32 minutes spent in face to face time with patient, >50% spent in counselling or coordination of care:  Long conversation with the patient who is well known and has had history of problems with primarily drugs in the past and to a lesser extent alcohol.  She was sober for many years, and then she slowly started drinking about 5 or 6 years ago.  Currently she is drinking at least 6 or 7 shots of hard alcohol daily, sometimes more than this.  Last time she drank was yesterday evening.  No drugs.  She wanted to stop drinking each day starting on Sunday, but she was unable to do so.  She is actively ready to quit alcohol.  She wants to do intensive outpatient therapy.  She did this previously after inpatient therapy at [facility].  She does not currently have an [place] sponsor, but she is been to some virtual meetings within the last few days.  She is going to try to get a sponsor today.  Plans on attending daily AA meetings.  She has a supportive husband.  She has several friends that she can talk to this about.  She is most concerned about withdrawal symptoms.  I think in this case, it  is certainly reasonable to give the patient a home Librium taper to assist.  Her husband is going to pick up her medication and dispensed to her.  And planning on checking on her by phone this week as well.  Following UTD protocol:  Patient Instructions  Librium  taper  25 mg tablets  Day 1:  2 tablets every 8 hours Day 2: 1 tablet every 6 hours Day 3:  1 tablet twice a day Day 4:  1 tablet at night    Follow-up: No follow-ups on file.  Meds ordered this encounter  Medications  . chlordiazePOXIDE (LIBRIUM) 25 MG capsule    Sig: 2 tablets q 8 hours for 1 day, then 1 tablet every 6 hours for 1 day, then 1 tablet twice a day for 1 day, then 1 tablet at night for 1 day    Dispense:  13 capsule    Refill:  0   No orders of the defined types were placed in this encounter.   Signed,  Maud Deed. Verdelle Valtierra, MD   Outpatient Encounter Medications as of 03/13/2019  Medication Sig  . albuterol (PROAIR HFA) 108 (90 Base) MCG/ACT inhaler INHALE 2 PUFFS INTO THE LUNGS EVERY 4 (FOUR) HOURS AS NEEDED. USE SPARINGLY  . atomoxetine (STRATTERA) 40 MG capsule TAKE 1 CAPSULE BY MOUTH TWICE DAILY  . azelastine (ASTELIN) 0.1 % nasal spray Place 1 spray into both nostrils 2 (two) times daily. Use in each nostril as directed  . buPROPion (WELLBUTRIN XL) 150 MG 24 hr tablet Take 1 tablet (150 mg total) by mouth daily.  Marland Kitchen EPINEPHrine 0.3 mg/0.3 mL IJ SOAJ injection USE UTD PRF SYSTEMATIC REACTION.  . fluticasone (FLONASE) 50 MCG/ACT nasal spray Place 1 spray into both nostrils daily.  . hyoscyamine (LEVSIN SL) 0.125 MG SL tablet Place 1 tablet (0.125 mg total) under the tongue every 4 (four) hours as needed.  . lidocaine-prilocaine (EMLA) cream APPLY TOPICALLY 4 (FOUR) TIMES DAILY AS NEEDED. FOUR TIMES A DAY TO AFFECTED AREA, 1 MONTH SUPPLY  . meloxicam (MOBIC) 15 MG tablet Take 1 tablet (15 mg total) by mouth daily as needed for pain. With food  . montelukast (SINGULAIR) 10 MG tablet TK 1 T PO QD  . NON  FORMULARY CBD oil SL daily  . PAZEO 0.7 % SOLN   . SYMBICORT 160-4.5 MCG/ACT inhaler INL 2 PFS PO BID  . tiZANidine (ZANAFLEX) 4 MG tablet TAKE 1 TABLET BY MOUTH EVERY NIGHT AT BEDTIME  . triamcinolone cream (KENALOG) 0.1 % Apply 1 application topically 2 (two) times daily.  . valACYclovir (VALTREX) 500 MG tablet Take 1 tablet (500 mg total) by mouth 2 (two) times daily. For 3 days during a flare  . amitriptyline (ELAVIL) 10 MG tablet Take 1 tablet (10 mg total) by mouth at bedtime. (Patient not taking: Reported on 03/13/2019)  . chlordiazePOXIDE (LIBRIUM) 25 MG capsule 2 tablets q 8 hours for 1 day, then 1 tablet every 6 hours for 1 day, then 1 tablet twice a day for 1 day, then 1 tablet at night for 1 day  . [DISCONTINUED] FLOVENT HFA 110 MCG/ACT inhaler INL 2 PFS PO BID  . [DISCONTINUED] levocetirizine (XYZAL) 5 MG tablet TK 1 T PO QD IN THE EVE   No facility-administered encounter medications on file as of 03/13/2019.

## 2019-03-13 NOTE — Patient Instructions (Signed)
Librium taper  25 mg tablets  Day 1:  2 tablets every 8 hours Day 2: 1 tablet every 6 hours Day 3:  1 tablet twice a day Day 4:  1 tablet at night

## 2019-03-18 ENCOUNTER — Encounter: Payer: Self-pay | Admitting: Family Medicine

## 2019-04-14 IMAGING — MG DIGITAL SCREENING BILATERAL MAMMOGRAM WITH CAD
7 series · 7 of 7 positions shown · non-contrast
Comparison: Previous exam(s).

CLINICAL DATA: Screening.

EXAM:
DIGITAL SCREENING BILATERAL MAMMOGRAM WITH CAD

[R MLO (1 of 2)]
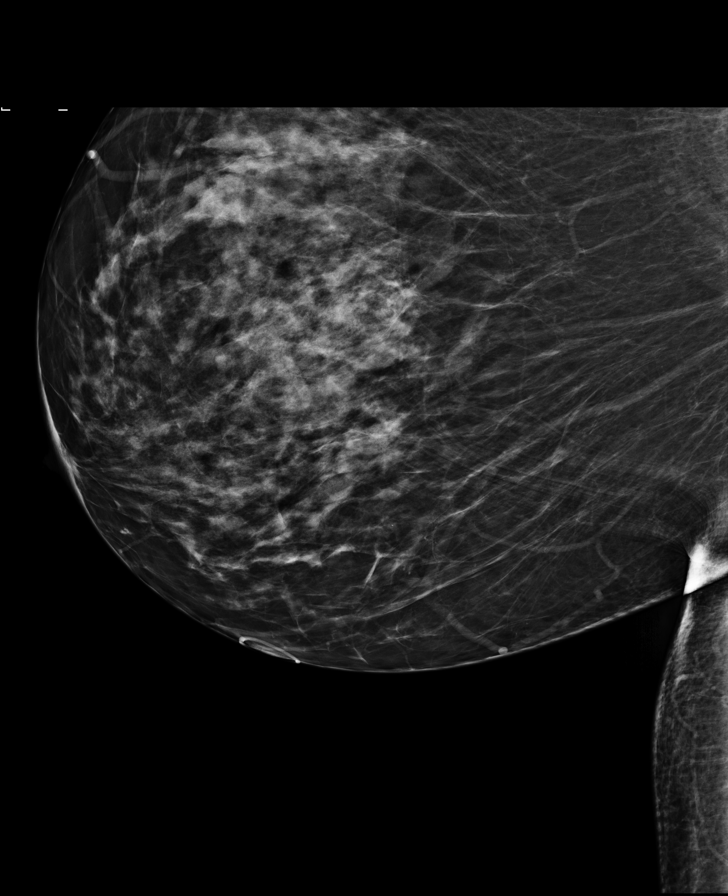

[R MLO (2 of 2)]
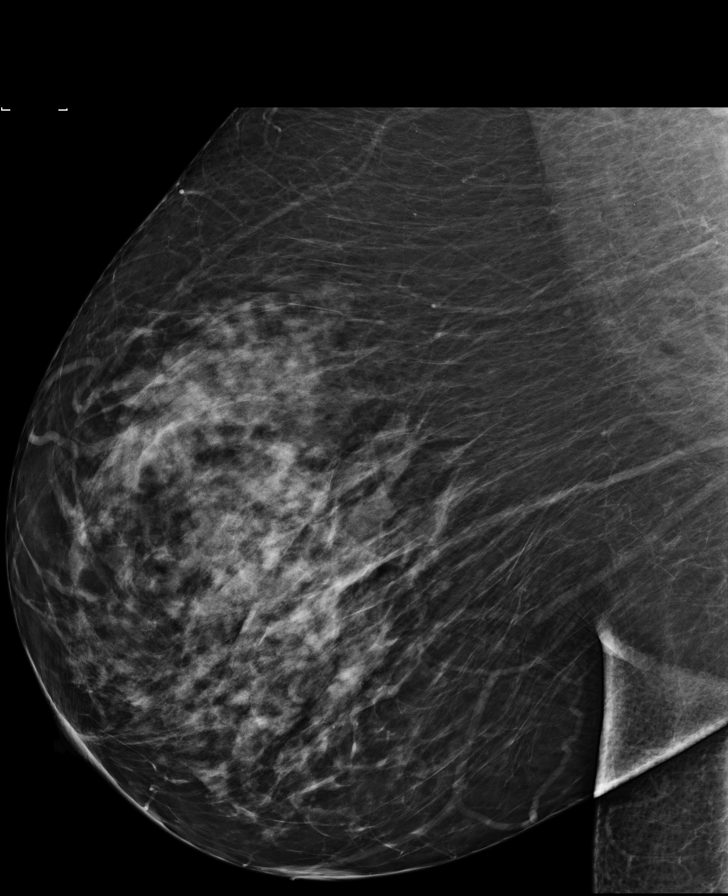

[R CV]
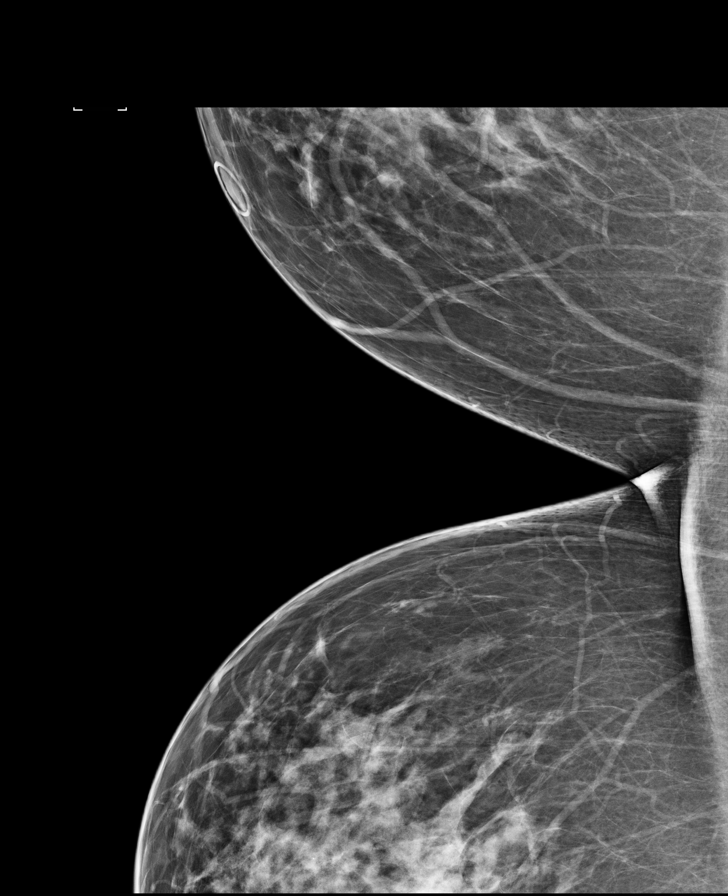

[L MLO (1 of 2)]
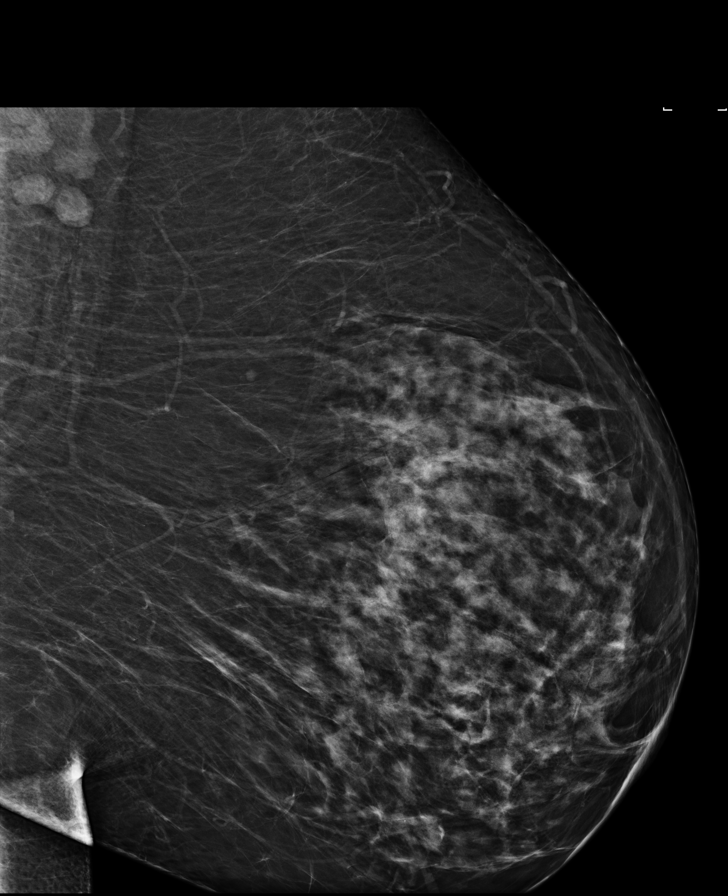

[R CC]
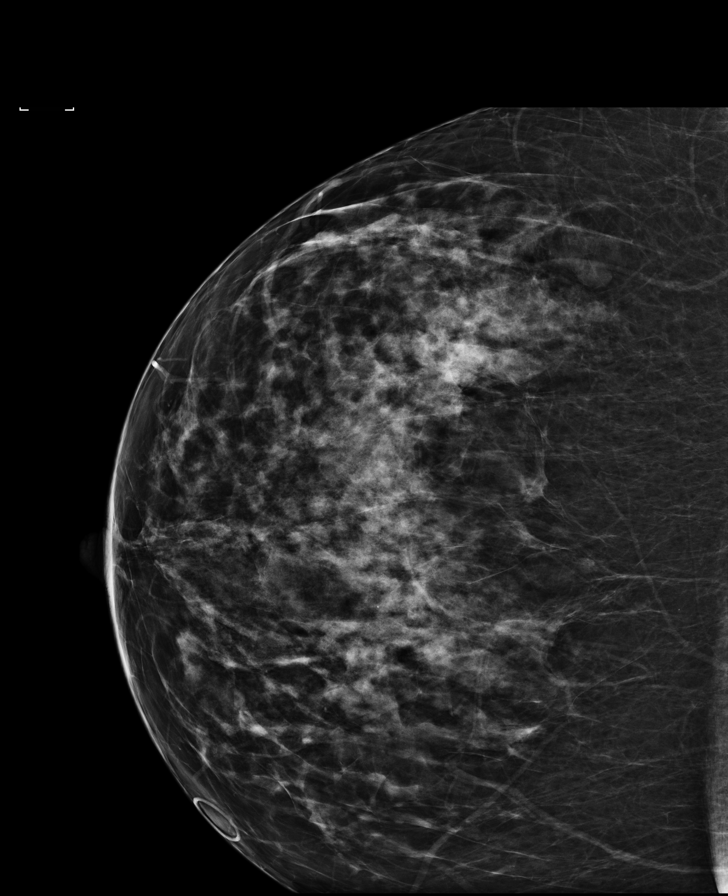

[L MLO (2 of 2)]
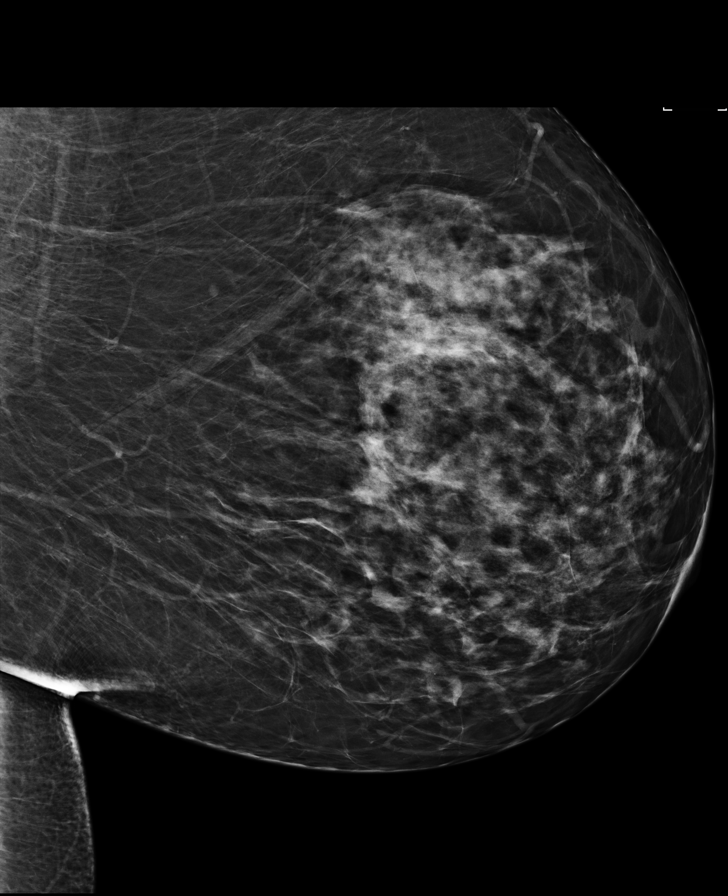

[L CC]
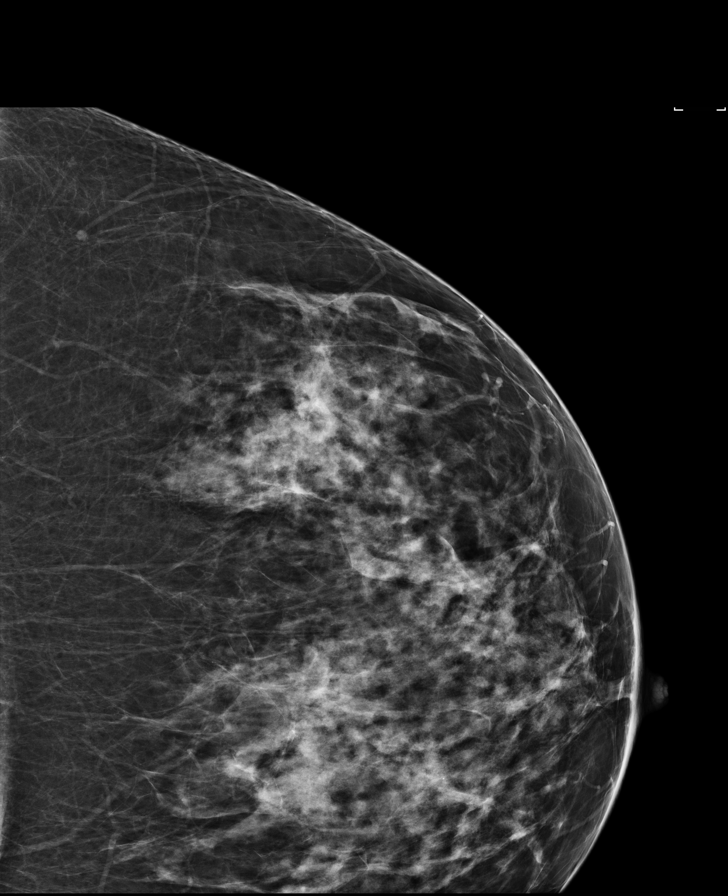

[7 of 7 positions shown; findings below may reference images not displayed]

ACR Breast Density Category c: The breast tissue is heterogeneously
dense, which may obscure small masses.
FINDINGS: There are no findings suspicious for malignancy. Images were
processed with CAD.
IMPRESSION: No mammographic evidence of malignancy. A result letter of this
screening mammogram will be mailed directly to the patient.

RECOMMENDATION:
Screening mammogram in one year. (Code:[id number])

BI-RADS CATEGORY  1: Negative.

## 2019-05-23 ENCOUNTER — Telehealth: Payer: Self-pay

## 2019-05-23 NOTE — Telephone Encounter (Signed)
Pt said for last 6 - 9 months 1 - 2 days before beginning menstrual cycle pt notices a dip in how pt feels; feels isolated and cannot go to [place] meetings due to high risk with covid. Pt has been newly sober for 72 days. No SI/HI; pt feels pretty good today. Pt wants to talk with Dr Lorelei Pont only because he has helped her so much. Pt wants to address for long term so will not feel depressed once a month. ED precautions given and pt voiced understanding. Shirlean Mylar had already scheduled pt an in office appt with Dr Lorelei Pont on 05/26/19 at 9:40.FYI to Dr Lorelei Pont.

## 2019-05-26 ENCOUNTER — Other Ambulatory Visit: Payer: Self-pay

## 2019-05-26 ENCOUNTER — Encounter: Payer: Self-pay | Admitting: Family Medicine

## 2019-05-26 ENCOUNTER — Ambulatory Visit: Payer: BC Managed Care – PPO | Admitting: Family Medicine

## 2019-05-26 VITALS — BP 100/70 | HR 98 | Temp 99.1°F | Ht 61.0 in | Wt 179.2 lb

## 2019-05-26 DIAGNOSIS — F1021 Alcohol dependence, in remission: Secondary | ICD-10-CM | POA: Diagnosis not present

## 2019-05-26 DIAGNOSIS — F3341 Major depressive disorder, recurrent, in partial remission: Secondary | ICD-10-CM

## 2019-05-26 MED ORDER — ESCITALOPRAM OXALATE 10 MG PO TABS: 10.0000 mg | ORAL_TABLET | Freq: Every day | ORAL | 2 refills | Status: DC

## 2019-05-26 NOTE — Progress Notes (Signed)
Estie Sproule T. Kendell Sagraves, MD Primary Care and [place] at [facility] [place], [address] Phone: [phone]  FAX: [place] - 43 y.o. female  MRN [id number]  Date of Birth: July 13, 1976  Visit Date: 05/26/2019  PCP: Owens Loffler, MD  Referred by: Owens Loffler, MD  Chief Complaint  Patient presents with  . Depression   Subjective:   Candice Mcgrath is a 43 y.o. very pleasant female patient who presents with the following:  >15 minutes spent in face to face time with patient, >50% spent in counselling or coordination of care  Has noticed right before her period will come, will feel a huge drop right before. Nxiety. Doing zoom meetings.  Meets with meetings a few times a day.   Depression has worsened recently, particular before her menses.  She also has stopped drinking approximately 6 weeks ago.  This is going well, she is doing multiple zoom meetings per day.  No DTs, shaking, or other problems.  We did give her a Librium taper before.  She does not have any SI or HI.  She is sleeping well, but notices that she is more depressed.    ICD-10-CM   1. Major depressive disorder, recurrent episode, in partial remission ([place])  F33.41   2. Personal history of alcoholism ([place])  F10.21      Follow-up: No follow-ups on file.  Meds ordered this encounter  Medications  . escitalopram (LEXAPRO) 10 MG tablet    Sig: Take 1 tablet (10 mg total) by mouth daily.    Dispense:  30 tablet    Refill:  2   No orders of the defined types were placed in this encounter.   Signed,  Maud Deed. Leigha Olberding, MD   Outpatient Encounter Medications as of 05/26/2019  Medication Sig  . albuterol (PROAIR HFA) 108 (90 Base) MCG/ACT inhaler INHALE 2 PUFFS INTO THE LUNGS EVERY 4 (FOUR) HOURS AS NEEDED. USE SPARINGLY  . atomoxetine (STRATTERA) 40 MG capsule TAKE 1 CAPSULE BY MOUTH TWICE DAILY  . azelastine  (ASTELIN) 0.1 % nasal spray Place 1 spray into both nostrils 2 (two) times daily. Use in each nostril as directed  . buPROPion (WELLBUTRIN XL) 150 MG 24 hr tablet Take 1 tablet (150 mg total) by mouth daily.  Marland Kitchen EPINEPHrine 0.3 mg/0.3 mL IJ SOAJ injection USE UTD PRF SYSTEMATIC REACTION.  . fluticasone (FLONASE) 50 MCG/ACT nasal spray Place 1 spray into both nostrils daily.  . hyoscyamine (LEVSIN SL) 0.125 MG SL tablet Place 1 tablet (0.125 mg total) under the tongue every 4 (four) hours as needed.  . lidocaine-prilocaine (EMLA) cream APPLY TOPICALLY 4 (FOUR) TIMES DAILY AS NEEDED. FOUR TIMES A DAY TO AFFECTED AREA, 1 MONTH SUPPLY  . meloxicam (MOBIC) 15 MG tablet Take 1 tablet (15 mg total) by mouth daily as needed for pain. With food  . montelukast (SINGULAIR) 10 MG tablet TK 1 T PO QD  . NON FORMULARY CBD oil SL daily  . PAZEO 0.7 % SOLN   . SYMBICORT 160-4.5 MCG/ACT inhaler INL 2 PFS PO BID  . tiZANidine (ZANAFLEX) 4 MG tablet TAKE 1 TABLET BY MOUTH EVERY NIGHT AT BEDTIME  . triamcinolone cream (KENALOG) 0.1 % Apply 1 application topically 2 (two) times daily.  . valACYclovir (VALTREX) 500 MG tablet Take 1 tablet (500 mg total) by mouth 2 (two) times daily. For 3 days during a flare  . [DISCONTINUED] chlordiazePOXIDE (LIBRIUM) 25  MG capsule 2 tablets q 8 hours for 1 day, then 1 tablet every 6 hours for 1 day, then 1 tablet twice a day for 1 day, then 1 tablet at night for 1 day  . escitalopram (LEXAPRO) 10 MG tablet Take 1 tablet (10 mg total) by mouth daily.  . [DISCONTINUED] amitriptyline (ELAVIL) 10 MG tablet Take 1 tablet (10 mg total) by mouth at bedtime. (Patient not taking: Reported on 03/13/2019)   No facility-administered encounter medications on file as of 05/26/2019.

## 2019-06-28 ENCOUNTER — Other Ambulatory Visit: Payer: Self-pay | Admitting: Family Medicine

## 2019-06-28 NOTE — Telephone Encounter (Signed)
Last filled on 12/09/2018 #90 with 1 refill LOV 05/26/2019 No future appointments

## 2019-07-14 ENCOUNTER — Other Ambulatory Visit: Payer: Self-pay | Admitting: Family Medicine

## 2019-07-19 ENCOUNTER — Other Ambulatory Visit: Payer: Self-pay

## 2019-07-19 ENCOUNTER — Ambulatory Visit (INDEPENDENT_AMBULATORY_CARE_PROVIDER_SITE_OTHER): Payer: BC Managed Care – PPO

## 2019-07-19 DIAGNOSIS — Z23 Encounter for immunization: Secondary | ICD-10-CM | POA: Diagnosis not present

## 2019-07-27 ENCOUNTER — Other Ambulatory Visit: Payer: Self-pay | Admitting: Family Medicine

## 2019-07-28 NOTE — Telephone Encounter (Signed)
LOV on 05/26/2019. No future appointments made. This medication was last filled on 08/27/2018 #30 with 1 refill by Dr Glori Bickers.

## 2019-08-15 ENCOUNTER — Other Ambulatory Visit: Payer: Self-pay

## 2019-08-15 DIAGNOSIS — Z20822 Contact with and (suspected) exposure to covid-19: Secondary | ICD-10-CM

## 2019-08-16 LAB — NOVEL CORONAVIRUS, NAA: SARS-CoV-2, NAA: NOT DETECTED

## 2019-08-17 NOTE — Progress Notes (Addendum)
     Candice Mcgrath T. Candice Rossi, MD Primary Care and [place] at [facility] [place], [address] Phone: [phone]  FAX: [place] - 43 y.o. female  MRN [id number]  Date of Birth: [birth year]  Visit Date: 08/18/2019  PCP: Owens Loffler, MD  Referred by: Owens Loffler, MD Chief Complaint  Patient presents with  . Nicotine Dependence    Needs smoking cessation counseling for insurance   Virtual Visit via Video Note:  I connected with  Candice Mcgrath on 08/18/2019  8:40 AM EDT by a video enabled telemedicine application and verified that I am speaking with the correct person using two identifiers.   Location patient: home computer, tablet, or smartphone Location provider: work or home office Consent: Verbal consent directly obtained from [name]. Persons participating in the virtual visit: patient, provider  I discussed the limitations of evaluation and management by telemedicine and the availability of in person appointments. The patient expressed understanding and agreed to proceed.  History of Present Illness:  Candice Mcgrath is a well-known patient.  She is here to talk about quitting smoking.  She has quit smoking before.  She had some health insurance that required her to talk about smoking cessation, and we are here to talk about a about various smoking cessation options.  Smoking since 17 off and on.  Smoking 1/2 pack a day now, but she wants to quit and taper off of her cigarettes.   Review of Systems as above: See pertinent positives and pertinent negatives per HPI No acute distress verbally  Past Medical History, Surgical History, Social History, Family History, Problem List, Medications, and Allergies have been reviewed and updated if relevant.   Observations/Objective/Exam:  An attempt was made to discern vital signs over the phone and per patient if applicable and possible.    General:    Alert, Oriented, appears well and in no acute distress HEENT:     Atraumatic, conjunctiva clear, no obvious abnormalities on inspection of external nose and ears.  Neck:    Normal movements of the head and neck Pulmonary:     On inspection no signs of respiratory distress, breathing rate appears normal, no obvious gross SOB, gasping or wheezing Cardiovascular:    No obvious cyanosis Musculoskeletal:    Moves all visible extremities without noticeable abnormality Psych / Neurological:     Pleasant and cooperative, no obvious depression or anxiety, speech and thought processing grossly intact  Assessment and Plan:    ICD-10-CM   1. Encounter for smoking cessation counseling  Z71.6    [name] would like to quit smoking, and today we talked about ways that this can be addressed.  I discussed the assessment and treatment plan with the patient. The patient was provided an opportunity to ask questions and all were answered. The patient agreed with the plan and demonstrated an understanding of the instructions.   The patient was advised to call back or seek an in-person evaluation if the symptoms worsen or if the condition fails to improve as anticipated.  Follow-up: prn unless noted otherwise below No follow-ups on file.  No orders of the defined types were placed in this encounter.  No orders of the defined types were placed in this encounter.   Signed,  Maud Deed. Ahilyn Nell, MD

## 2019-08-18 ENCOUNTER — Encounter: Payer: Self-pay | Admitting: Family Medicine

## 2019-08-18 ENCOUNTER — Ambulatory Visit (INDEPENDENT_AMBULATORY_CARE_PROVIDER_SITE_OTHER): Payer: BC Managed Care – PPO | Admitting: Family Medicine

## 2019-08-18 ENCOUNTER — Other Ambulatory Visit: Payer: Self-pay

## 2019-08-18 VITALS — Temp 96.5°F | Ht 61.0 in | Wt 179.0 lb

## 2019-08-18 DIAGNOSIS — Z716 Tobacco abuse counseling: Secondary | ICD-10-CM | POA: Diagnosis not present

## 2019-09-16 ENCOUNTER — Telehealth: Payer: Self-pay

## 2019-09-16 NOTE — Telephone Encounter (Signed)
Patient contacted the office and stated that she was seen on 08/18/19 for a smoking cessation appt with Dr. Lorelei Pont. Patient states she received a note from her employer that this needed to be coded under : [phone] and/or [phone] to be covered.  Candice Mcgrath, is this something we can change within the office - or do I need to advise patient to call billing? Thank you!

## 2019-09-16 NOTE — Telephone Encounter (Signed)
E-mail sent to billing and coding for review.

## 2019-09-17 NOTE — Telephone Encounter (Signed)
Dr. Lorelei Pont,  I spoke with [facility] about the patient's request. She stated these visits have specific documentation requirements and require time spent counseling to be documented. Did you fill out any paperwork for the patient?

## 2019-09-19 NOTE — Telephone Encounter (Signed)
I did do that visit specifically about smoking cessation, but I had no idea what any specific code aside from a routine OV at that time.  Dawn, I really do not remember them.  Is there any way that you could send it to me again? I think this is very appropriate, since all we did was talk about tobacco cessation.

## 2019-09-21 NOTE — Telephone Encounter (Signed)
Hi Dr. Lorelei Pont,   I sent to all providers via email last week on 11/24.  Thanks, DAwn

## 2019-09-22 ENCOUNTER — Encounter: Payer: Self-pay | Admitting: Family Medicine

## 2019-09-22 NOTE — Telephone Encounter (Signed)
Hi Dr .Frederico Hamman,  Do you have any time that we can discuss over the phone?  I am working at home but we can connect over [employer], or I can give my cell number.  Thanks, [employer]

## 2019-09-22 NOTE — Telephone Encounter (Signed)
I misunderstood, spoke to dawn and will keep OV charge without the specific smoking code

## 2019-09-22 NOTE — Telephone Encounter (Signed)
Noted  

## 2019-09-22 NOTE — Telephone Encounter (Signed)
Dawn,   I corrected this charge, but I have no idea how to drop the OV charge, can you help?

## 2019-09-22 NOTE — Telephone Encounter (Signed)
LMOM.  Cannot change OV code given 5 weeks later.   I asked her a couple of specific questions, if she needs letter or anything I am happy to provide

## 2019-09-22 NOTE — Telephone Encounter (Signed)
Candice Mcgrath, my cell is [phone]

## 2019-09-22 NOTE — Telephone Encounter (Signed)
I have no idea what a [employer] team phone is, but I can call you on the phone during a slow period.

## 2019-09-22 NOTE — Telephone Encounter (Signed)
Hi,   Also, one last thing I should have mentioned, if you are having to change something on a encounter that possibly has been filed to insurance, you should contact us and not change on your own, we will contact our billing department and have them send a corrected claim to insurance or else the claim will be denied as duplicate.  Very complicated process, I know, but this is the proper procedure.     Thank you for your time.  Dawn

## 2019-09-25 ENCOUNTER — Other Ambulatory Visit: Payer: Self-pay

## 2019-09-25 ENCOUNTER — Other Ambulatory Visit: Payer: Self-pay | Admitting: *Deleted

## 2019-09-25 DIAGNOSIS — Z20822 Contact with and (suspected) exposure to covid-19: Secondary | ICD-10-CM

## 2019-09-25 MED ORDER — ATOMOXETINE HCL 40 MG PO CAPS: 40.0000 mg | ORAL_CAPSULE | Freq: Two times a day (BID) | ORAL | 5 refills | Status: DC

## 2019-09-25 MED ORDER — ESCITALOPRAM OXALATE 10 MG PO TABS: 10.0000 mg | ORAL_TABLET | Freq: Every day | ORAL | 5 refills | Status: DC

## 2019-09-25 NOTE — Telephone Encounter (Signed)
Last office visit 08/18/2019 for smoking cessation.  Last refilled 12/23/2018 for #60 with 5 refills.  No future appointments.

## 2019-09-26 ENCOUNTER — Encounter: Payer: Self-pay | Admitting: Family Medicine

## 2019-09-27 LAB — NOVEL CORONAVIRUS, NAA: SARS-CoV-2, NAA: DETECTED — AB

## 2019-10-20 ENCOUNTER — Telehealth: Payer: Self-pay

## 2019-10-20 MED ORDER — BUPROPION HCL ER (XL) 150 MG PO TB24: ORAL_TABLET | ORAL | 2 refills | Status: DC

## 2019-10-20 NOTE — Telephone Encounter (Signed)
Patient is due for a CPE. Please schedule when possible. Thank you

## 2019-10-22 NOTE — Telephone Encounter (Signed)
2/9 labs 2/11 cpx

## 2019-11-21 ENCOUNTER — Ambulatory Visit: Payer: BC Managed Care – PPO | Attending: Internal Medicine

## 2019-11-21 ENCOUNTER — Other Ambulatory Visit: Payer: Self-pay | Admitting: Family Medicine

## 2019-11-21 DIAGNOSIS — Z20822 Contact with and (suspected) exposure to covid-19: Secondary | ICD-10-CM

## 2019-11-21 NOTE — Telephone Encounter (Signed)
Last office visit 08/18/2019 for nicotine dependence.  Last refilled 11/09/2014 for 454 g with no refills.  CPE scheduled for 12/04/2019.

## 2019-11-22 LAB — NOVEL CORONAVIRUS, NAA: SARS-CoV-2, NAA: NOT DETECTED

## 2019-11-23 MED ORDER — TRIAMCINOLONE ACETONIDE 0.1 % EX CREA: 1.0000 "application " | TOPICAL_CREAM | Freq: Two times a day (BID) | CUTANEOUS | 0 refills | Status: DC

## 2019-11-28 ENCOUNTER — Other Ambulatory Visit: Payer: Self-pay | Admitting: Family Medicine

## 2019-11-28 ENCOUNTER — Telehealth: Payer: Self-pay

## 2019-11-28 DIAGNOSIS — Z131 Encounter for screening for diabetes mellitus: Secondary | ICD-10-CM

## 2019-11-28 DIAGNOSIS — Z0000 Encounter for general adult medical examination without abnormal findings: Secondary | ICD-10-CM

## 2019-11-28 NOTE — Telephone Encounter (Signed)
LVM w COVID screen, front door and back lab 2.5.2021 TLJ

## 2019-12-02 ENCOUNTER — Other Ambulatory Visit (INDEPENDENT_AMBULATORY_CARE_PROVIDER_SITE_OTHER): Payer: BC Managed Care – PPO

## 2019-12-02 ENCOUNTER — Other Ambulatory Visit: Payer: Self-pay

## 2019-12-02 DIAGNOSIS — Z131 Encounter for screening for diabetes mellitus: Secondary | ICD-10-CM

## 2019-12-02 DIAGNOSIS — Z0000 Encounter for general adult medical examination without abnormal findings: Secondary | ICD-10-CM

## 2019-12-02 LAB — CBC WITH DIFFERENTIAL/PLATELET
Basophils Absolute: 0.1 10*3/uL (ref 0.0–0.1)
Basophils Relative: 0.7 % (ref 0.0–3.0)
Eosinophils Absolute: 0.2 10*3/uL (ref 0.0–0.7)
Eosinophils Relative: 2.7 % (ref 0.0–5.0)
HCT: 43.9 % (ref 36.0–46.0)
Hemoglobin: 14.7 g/dL (ref 12.0–15.0)
Lymphocytes Relative: 20.4 % (ref 12.0–46.0)
Lymphs Abs: 1.8 10*3/uL (ref 0.7–4.0)
MCHC: 33.5 g/dL (ref 30.0–36.0)
MCV: 94.3 fl (ref 78.0–100.0)
Monocytes Absolute: 0.7 10*3/uL (ref 0.1–1.0)
Monocytes Relative: 7.9 % (ref 3.0–12.0)
Neutro Abs: 6.1 10*3/uL (ref 1.4–7.7)
Neutrophils Relative %: 68.3 % (ref 43.0–77.0)
Platelets: 258 10*3/uL (ref 150.0–400.0)
RBC: 4.65 Mil/uL (ref 3.87–5.11)
RDW: 13.4 % (ref 11.5–15.5)
WBC: 8.9 10*3/uL (ref 4.0–10.5)

## 2019-12-02 LAB — BASIC METABOLIC PANEL
BUN: 15 mg/dL (ref 6–23)
CO2: 25 mEq/L (ref 19–32)
Calcium: 9.1 mg/dL (ref 8.4–10.5)
Chloride: 106 mEq/L (ref 96–112)
Creatinine, Ser: 0.76 mg/dL (ref 0.40–1.20)
GFR: 83.39 mL/min (ref >60.00)
Glucose, Bld: 98 mg/dL (ref 70–99)
Potassium: 4.1 mEq/L (ref 3.5–5.1)
Sodium: 137 mEq/L (ref 135–145)

## 2019-12-02 LAB — LIPID PANEL
Cholesterol: 178 mg/dL (ref 0–200)
HDL: 51.1 mg/dL (ref >39.00)
LDL Cholesterol: 111 mg/dL — ABNORMAL HIGH (ref 0–99)
NonHDL: 127.31
Total CHOL/HDL Ratio: 3
Triglycerides: 80 mg/dL (ref 0.0–149.0)
VLDL: 16 mg/dL (ref 0.0–40.0)

## 2019-12-02 LAB — HEPATIC FUNCTION PANEL
ALT: 33 U/L (ref 0–35)
AST: 23 U/L (ref 0–37)
Albumin: 4.1 g/dL (ref 3.5–5.2)
Alkaline Phosphatase: 75 U/L (ref 39–117)
Bilirubin, Direct: 0.1 mg/dL (ref 0.0–0.3)
Total Bilirubin: 0.3 mg/dL (ref 0.2–1.2)
Total Protein: 7.1 g/dL (ref 6.0–8.3)

## 2019-12-02 LAB — TSH: TSH: 2.62 u[IU]/mL (ref 0.35–4.50)

## 2019-12-02 LAB — HEMOGLOBIN A1C: Hgb A1c MFr Bld: 6 % (ref 4.6–6.5)

## 2019-12-04 ENCOUNTER — Ambulatory Visit (INDEPENDENT_AMBULATORY_CARE_PROVIDER_SITE_OTHER): Payer: BC Managed Care – PPO | Admitting: Family Medicine

## 2019-12-04 ENCOUNTER — Other Ambulatory Visit: Payer: Self-pay

## 2019-12-04 ENCOUNTER — Other Ambulatory Visit (HOSPITAL_COMMUNITY)
Admission: RE | Admit: 2019-12-04 | Discharge: 2019-12-04 | Disposition: A | Discharge status: Home / Self Care | Payer: BC Managed Care – PPO | Source: Ambulatory Visit | Attending: Family Medicine | Admitting: Family Medicine

## 2019-12-04 ENCOUNTER — Encounter: Payer: Self-pay | Admitting: Family Medicine

## 2019-12-04 VITALS — BP 90/70 | HR 92 | Temp 98.6°F | Ht 60.75 in | Wt 182.8 lb

## 2019-12-04 DIAGNOSIS — Z124 Encounter for screening for malignant neoplasm of cervix: Secondary | ICD-10-CM

## 2019-12-04 DIAGNOSIS — L989 Disorder of the skin and subcutaneous tissue, unspecified: Secondary | ICD-10-CM

## 2019-12-04 DIAGNOSIS — F411 Generalized anxiety disorder: Secondary | ICD-10-CM | POA: Diagnosis not present

## 2019-12-04 DIAGNOSIS — Z0000 Encounter for general adult medical examination without abnormal findings: Secondary | ICD-10-CM

## 2019-12-04 NOTE — Progress Notes (Signed)
Candice Mcgrath T. Candice Kage, MD Primary Care and [place] at [facility] [place], [address] Phone: [phone]  FAX: [place] - 44 y.o. female  MRN [id number]  Date of Birth: Mar 02, 1976  Visit Date: 12/04/2019  PCP: Owens Loffler, MD  Referred by: Owens Loffler, MD  Chief Complaint  Patient presents with  . Annual Exam    This visit occurred during the SARS-CoV-2 public health emergency.  Safety protocols were in place, including screening questions prior to the visit, additional usage of staff PPE, and extensive cleaning of exam room while observing appropriate contact time as indicated for disinfecting solutions.   Patient Care Team: Owens Loffler, MD as PCP - General Marylynn Pearson, MD as Consulting Physician (Obstetrics and Gynecology) Lowella Bandy, MD as Consulting Physician (Urology) Subjective:   Candice Mcgrath is a 44 y.o. pleasant patient who presents with the following:  Health Maintenance Summary Reviewed and updated, unless pt declines services.  Tobacco History Reviewed. Smoker, < 1 ppd Alcohol: No concerns, no excessive use Exercise Habits: Some activity, rec at least 30 mins 5 times a week STD concerns: none, married with Clifton James Drug Use: None Lumps or breast concerns: no  9 months sober. Active in AA.  [place] several family members died.   S/p Covid No taste or smell.  2016 last pap.   Referal to dermatologist - ? Several skin lesions  Wt Readings from Last 3 Encounters:  12/04/19 182 lb 12 oz (82.9 kg)  08/18/19 179 lb (81.2 kg)  05/26/19 179 lb 4 oz (81.3 kg)     Health Maintenance  Topic Date Due  . PAP SMEAR-Modifier  06/07/2018  . TETANUS/TDAP  04/05/2027  . INFLUENZA VACCINE  Completed  . HIV Screening  Completed    Immunization History  Administered Date(s) Administered  . Hep A / Hep B 04/04/2017  . Influenza, Seasonal, Injecte,  Preservative Fre 09/02/2014  . Influenza,inj,Quad PF,6+ Mos 07/10/2013, 07/24/2016, 07/19/2019  . Tdap 04/04/2017   Patient Active Problem List   Diagnosis Date Noted  . Generalized anxiety disorder 09/02/2014    Priority: Medium  . Major depressive disorder, recurrent episode, in partial remission ([place]) 05/18/2014    Priority: Medium  . Personal history of drug dependence ([place]) 12/24/2012    Priority: Medium  . Migraine headache 12/24/2012    Priority: Medium  . ASTHMA 06/23/2010    Priority: Medium  . Personal history of alcoholism ([place]) 10/30/2008    Priority: Medium  . IBS 12/13/2009  . Hyperlipidemia 11/08/2009  . ADD 11/08/2009  . ALLERGIC RHINITIS 11/01/2008  . NEPHROLITHIASIS, HX OF 11/01/2008  . GENITAL HERPES 10/30/2008    Past Medical History:  Diagnosis Date  . Allergic rhinitis   . Drug abuse ([place])    adderrall, Xanax, MJ, others, no IV  . ETOH abuse    initially sober 2007, [facility]  . Herpes   . Hyperlipidemia   . IBS (irritable bowel syndrome)   . Migraine headache 12/24/2012  . Nephrolithiasis   . Pre-diabetes     Past Surgical History:  Procedure Laterality Date  . Basket Surgery  2005, 06   kidney stones  . TYMPANOSTOMY TUBE PLACEMENT Bilateral    as a child    Family History  Problem Relation Age of Onset  . Stroke Maternal Grandmother   . Cancer Maternal Grandfather   . Heart disease Paternal Grandfather   . Alcohol abuse Other  GP  . Breast cancer Cousin   . Stroke Other        [place]    Past Medical History, Surgical History, Social History, Family History, Problem List, Medications, and Allergies have been reviewed and updated if relevant.  Review of Systems: Pertinent positives are listed above.  Otherwise, a full 14 point review of systems has been done in full and it is negative except where it is noted positive.  Objective:   BP 90/70   Pulse 92   Temp 98.6 F (37 C) (Temporal)   Ht 5' 0.75" (1.543 m)   Wt 182  lb 12 oz (82.9 kg)   LMP 10/29/2019   SpO2 96%   BMI 34.82 kg/m  Ideal Body Weight: Weight in (lb) to have BMI = 25: 131 No exam data present Depression screen [facility] 2/9 12/04/2019 05/26/2019 05/20/2018 04/04/2017  Decreased Interest 0 1 0 0  Down, Depressed, Hopeless 1 1 0 1  PHQ - 2 Score 1 2 0 1  Altered sleeping 2 1 - -  Tired, decreased energy 1 1 - -  Change in appetite 1 2 - -  Feeling bad or failure about yourself  0 0 - -  Trouble concentrating 2 2 - -  Moving slowly or fidgety/restless 1 1 - -  Suicidal thoughts 0 0 - -  PHQ-9 Score 8 9 - -  Difficult doing work/chores Somewhat difficult Somewhat difficult - -    GEN: well developed, well nourished, no acute distress Eyes: conjunctiva and lids normal, PERRLA, EOMI ENT: TM clear, nares clear, oral exam WNL Neck: supple, no lymphadenopathy, no thyromegaly, no JVD Pulm: clear to auscultation and percussion, respiratory effort normal CV: regular rate and rhythm, S1-S2, no murmur, rub or gallop, no bruits Chest: no scars, masses, no lumps This portion of the physical examination was chaperoned by Hedy Camara, CMA.  BREAST: breast exam normal. Chaperoned examination. Entirety of breast examined including axilla, and no enlarged lymph nodes. No significant masses are felt. No nipple discharge. No skin changes. Overall, reassuring breast exam.  GI: soft, non-tender; no hepatosplenomegaly, masses; active bowel sounds all quadrants Gynecological exam: Chaperoned by Mrs. Loring.  Externally appears normal with no skin lesions or ulcerations.  The vaginal canal and cervix are easily seen.  No lesions. Lymph: no cervical, axillary or inguinal adenopathy MSK: gait normal, muscle tone and strength WNL, no joint swelling, effusions, discoloration, crepitus  SKIN: clear, good turgor, color WNL, no rashes, lesions, or ulcerations Neuro: normal mental status, normal strength, sensation, and motion Psych: alert; oriented to person, place and  time, normally interactive and not anxious or depressed in appearance.   All labs reviewed with patient. Results for orders placed or performed in visit on 12/02/19  TSH  Result Value Ref Range   TSH 2.62 0.35 - 4.50 uIU/mL  Hemoglobin A1c  Result Value Ref Range   Hgb A1c MFr Bld 6.0 4.6 - 6.5 %  CBC with Differential/Platelet  Result Value Ref Range   WBC 8.9 4.0 - 10.5 K/uL   RBC 4.65 3.87 - 5.11 Mil/uL   Hemoglobin 14.7 12.0 - 15.0 g/dL   HCT 43.9 36.0 - 46.0 %   MCV 94.3 78.0 - 100.0 fl   MCHC 33.5 30.0 - 36.0 g/dL   RDW 13.4 11.5 - 15.5 %   Platelets 258.0 150.0 - 400.0 K/uL   Neutrophils Relative % 68.3 43.0 - 77.0 %   Lymphocytes Relative 20.4 12.0 - 46.0 %  Monocytes Relative 7.9 3.0 - 12.0 %   Eosinophils Relative 2.7 0.0 - 5.0 %   Basophils Relative 0.7 0.0 - 3.0 %   Neutro Abs 6.1 1.4 - 7.7 K/uL   Lymphs Abs 1.8 0.7 - 4.0 K/uL   Monocytes Absolute 0.7 0.1 - 1.0 K/uL   Eosinophils Absolute 0.2 0.0 - 0.7 K/uL   Basophils Absolute 0.1 0.0 - 0.1 K/uL  Basic metabolic panel  Result Value Ref Range   Sodium 137 135 - 145 mEq/L   Potassium 4.1 3.5 - 5.1 mEq/L   Chloride 106 96 - 112 mEq/L   CO2 25 19 - 32 mEq/L   Glucose, Bld 98 70 - 99 mg/dL   BUN 15 6 - 23 mg/dL   Creatinine, Ser 0.76 0.40 - 1.20 mg/dL   GFR 83.39 >60.00 mL/min   Calcium 9.1 8.4 - 10.5 mg/dL  Hepatic function panel  Result Value Ref Range   Total Bilirubin 0.3 0.2 - 1.2 mg/dL   Bilirubin, Direct 0.1 0.0 - 0.3 mg/dL   Alkaline Phosphatase 75 39 - 117 U/L   AST 23 0 - 37 U/L   ALT 33 0 - 35 U/L   Total Protein 7.1 6.0 - 8.3 g/dL   Albumin 4.1 3.5 - 5.2 g/dL  Lipid panel  Result Value Ref Range   Cholesterol 178 0 - 200 mg/dL   Triglycerides 80.0 0.0 - 149.0 mg/dL   HDL 51.10 >39.00 mg/dL   VLDL 16.0 0.0 - 40.0 mg/dL   LDL Cholesterol 111 (H) 0 - 99 mg/dL   Total CHOL/HDL Ratio 3    NonHDL 127.31    No results found.  Assessment and Plan:     ICD-10-CM   1. Healthcare maintenance   Z00.00   2. Cervical cancer screening  Z12.4 Cytology - PAP([place])  3. Skin lesion  L98.9 Ambulatory referral to Dermatology  4. GAD (generalized anxiety disorder)  F41.1 Ambulatory referral to Psychology   She has had some significant depression and anxiety with COVID-19.  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  For anxiety I am going to refer her to psychology for counseling.  Health Maintenance Exam: The patient's preventative maintenance and recommended screening tests for an annual wellness exam were reviewed in full today. Brought up to date unless services declined.  Counselled on the importance of diet, exercise, and its role in overall health and mortality. The patient's FH and SH was reviewed, including their home life, tobacco status, and drug and alcohol status.  Follow-up in 1 year for physical exam or additional follow-up below.  Follow-up: No follow-ups on file. Or follow-up in 1 year if not noted.  No future appointments.  No orders of the defined types were placed in this encounter.  Medications Discontinued During This Encounter  Medication Reason  . NON FORMULARY Patient Preference   Orders Placed This Encounter  Procedures  . Ambulatory referral to Dermatology  . Ambulatory referral to Psychology    Signed,  Frederico Hamman T. Yashar Inclan, MD   Allergies as of 12/04/2019      Reactions   Sulfamethoxazole-trimethoprim    REACTION: itching      Medication List       Accurate as of December 04, 2019 11:59 PM. If you have any questions, ask your nurse or doctor.        STOP taking these medications   NON FORMULARY Stopped by: Owens Loffler, MD  TAKE these medications   albuterol 108 (90 Base) MCG/ACT inhaler Commonly known as: ProAir HFA INHALE 2 PUFFS INTO THE LUNGS EVERY 4 (FOUR) HOURS AS NEEDED. USE SPARINGLY   atomoxetine 40 MG capsule Commonly known as: STRATTERA Take 1 capsule (40 mg total)  by mouth 2 (two) times daily.   azelastine 0.1 % nasal spray Commonly known as: ASTELIN Place 1 spray into both nostrils 2 (two) times daily. Use in each nostril as directed   buPROPion 150 MG 24 hr tablet Commonly known as: WELLBUTRIN XL TAKE 1 TABLET(150 MG) BY MOUTH DAILY   EPINEPHrine 0.3 mg/0.3 mL Soaj injection Commonly known as: EPI-PEN USE UTD PRF SYSTEMATIC REACTION.   escitalopram 10 MG tablet Commonly known as: Lexapro Take 1 tablet (10 mg total) by mouth daily.   fluticasone 50 MCG/ACT nasal spray Commonly known as: FLONASE Place 1 spray into both nostrils daily.   hyoscyamine 0.125 MG SL tablet Commonly known as: LEVSIN SL Place 1 tablet (0.125 mg total) under the tongue every 4 (four) hours as needed.   lidocaine-prilocaine cream Commonly known as: EMLA APPLY TOPICALLY 4 (FOUR) TIMES DAILY AS NEEDED. FOUR TIMES A DAY TO AFFECTED AREA, 1 MONTH SUPPLY   meloxicam 15 MG tablet Commonly known as: MOBIC TAKE 1 TABLET(15 MG) BY MOUTH DAILY WITH FOOD AS NEEDED FOR PAIN   montelukast 10 MG tablet Commonly known as: SINGULAIR TK 1 T PO QD   Pazeo 0.7 % Soln Generic drug: Olopatadine HCl   Symbicort 160-4.5 MCG/ACT inhaler Generic drug: budesonide-formoterol INL 2 PFS PO BID   tiZANidine 4 MG tablet Commonly known as: ZANAFLEX TAKE 1 TABLET BY MOUTH EVERY NIGHT AT BEDTIME   triamcinolone cream 0.1 % Commonly known as: KENALOG Apply 1 application topically 2 (two) times daily.   valACYclovir 500 MG tablet Commonly known as: VALTREX Take 1 tablet (500 mg total) by mouth 2 (two) times daily. For 3 days during a flare

## 2019-12-06 ENCOUNTER — Encounter: Payer: Self-pay | Admitting: Family Medicine

## 2019-12-08 LAB — CYTOLOGY - PAP
Comment: NEGATIVE
Diagnosis: NEGATIVE
High risk HPV: NEGATIVE

## 2019-12-29 ENCOUNTER — Telehealth: Payer: Self-pay | Admitting: *Deleted

## 2019-12-29 NOTE — Telephone Encounter (Signed)
Received fax from [facility] requesting PA for Atomoxetine 40 mg.  PA completed on CoverMyMeds.  Sent for review.  Can take up to 3 business days for a decision.

## 2019-12-29 NOTE — Telephone Encounter (Signed)
PA approved from 12/29/2019 through 12/29/2022.  [facility] notified of approval via fax.

## 2020-01-11 ENCOUNTER — Encounter: Payer: Self-pay | Admitting: Family Medicine

## 2020-01-12 MED ORDER — TIZANIDINE HCL 4 MG PO TABS: 4.0000 mg | ORAL_TABLET | Freq: Every day | ORAL | 1 refills | Status: DC

## 2020-01-12 NOTE — Telephone Encounter (Signed)
Last office visit 12/04/2019 for CPE.  Last refilled 06/29/2019 for #90 with 1 refill. No future appointments.

## 2020-01-26 ENCOUNTER — Other Ambulatory Visit: Payer: Self-pay | Admitting: *Deleted

## 2020-01-26 MED ORDER — BUPROPION HCL ER (XL) 150 MG PO TB24: ORAL_TABLET | ORAL | 5 refills | Status: DC

## 2020-03-01 ENCOUNTER — Ambulatory Visit: Payer: BC Managed Care – PPO | Attending: Internal Medicine

## 2020-03-01 DIAGNOSIS — Z20822 Contact with and (suspected) exposure to covid-19: Secondary | ICD-10-CM

## 2020-03-02 LAB — SARS-COV-2, NAA 2 DAY TAT

## 2020-03-02 LAB — NOVEL CORONAVIRUS, NAA: SARS-CoV-2, NAA: NOT DETECTED

## 2020-05-22 ENCOUNTER — Other Ambulatory Visit: Payer: Self-pay | Admitting: Family Medicine

## 2020-06-10 ENCOUNTER — Other Ambulatory Visit: Payer: Self-pay

## 2020-06-10 ENCOUNTER — Ambulatory Visit: Payer: BC Managed Care – PPO | Admitting: Physician Assistant

## 2020-06-10 DIAGNOSIS — J309 Allergic rhinitis, unspecified: Secondary | ICD-10-CM

## 2020-06-10 LAB — POC COVID19 BINAXNOW: SARS Coronavirus 2 Ag: NEGATIVE

## 2020-06-10 NOTE — Patient Instructions (Signed)
Your rapid Covid-19 was negative.  Continue with your medications for allergies as directed.    Please let us know if there is anything else we can do for you.  Kennieth Rad, PA-C Physician Assistant [facility] [email or web address]  Allergic Rhinitis, Adult Allergic rhinitis is an allergic reaction that affects the mucous membrane inside the nose. It causes sneezing, a runny or stuffy nose, and the feeling of mucus going down the back of the throat (postnasal drip). Allergic rhinitis can be mild to severe. There are two types of allergic rhinitis:  Seasonal. This type is also called hay fever. It happens only during certain seasons.  Perennial. This type can happen at any time of the year. What are the causes? This condition happens when the body's defense system (immune system) responds to certain harmless substances called allergens as though they were germs.  Seasonal allergic rhinitis is triggered by pollen, which can come from grasses, trees, and weeds. Perennial allergic rhinitis may be caused by:  House dust mites.  Pet dander.  Mold spores. What are the signs or symptoms? Symptoms of this condition include:  Sneezing.  Runny or stuffy nose (nasal congestion).  Postnasal drip.  Itchy nose.  Tearing of the eyes.  Trouble sleeping.  Daytime sleepiness. How is this diagnosed? This condition may be diagnosed based on:  Your medical history.  A physical exam.  Tests to check for related conditions, such as: ? Asthma. ? Pink eye. ? Ear infection. ? Upper respiratory infection.  Tests to find out which allergens trigger your symptoms. These may include skin or blood tests. How is this treated? There is no cure for this condition, but treatment can help control symptoms. Treatment may include:  Taking medicines that block allergy symptoms, such as antihistamines. Medicine may be given as a shot, nasal  spray, or pill.  Avoiding the allergen.  Desensitization. This treatment involves getting ongoing shots until your body becomes less sensitive to the allergen. This treatment may be done if other treatments do not help.  If taking medicine and avoiding the allergen does not work, new, stronger medicines may be prescribed. Follow these instructions at home:  Find out what you are allergic to. Common allergens include smoke, dust, and pollen.  Avoid the things you are allergic to. These are some things you can do to help avoid allergens: ? Replace carpet with wood, tile, or vinyl flooring. Carpet can trap dander and dust. ? Do not smoke. Do not allow smoking in your home. ? Change your heating and air conditioning filter at least once a month. ? During allergy season:  Keep windows closed as much as possible.  Plan outdoor activities when pollen counts are lowest. This is usually during the evening hours.  When coming indoors, change clothing and shower before sitting on furniture or bedding.  Take over-the-counter and prescription medicines only as told by your health care provider.  Keep all follow-up visits as told by your health care provider. This is important. Contact a health care provider if:  You have a fever.  You develop a persistent cough.  You make whistling sounds when you breathe (you wheeze).  Your symptoms interfere with your normal daily activities. Get help right away if:  You have shortness of breath. Summary  This condition can be managed by taking medicines as directed and avoiding allergens.  Contact your health care provider if you develop a persistent cough or fever.  During allergy season, keep windows closed  as much as possible. This information is not intended to replace advice given to you by your health care provider. Make sure you discuss any questions you have with your health care provider. Document Revised: 09/21/2017 Document Reviewed:  11/16/2016 Elsevier Patient Education  2020 [employer].

## 2020-06-10 NOTE — Progress Notes (Signed)
New Patient Office Visit  Subjective:  Patient ID: Candice Mcgrath, female    DOB: January 11, 1976  Age: 44 y.o. MRN: [id number]  CC:  Chief Complaint  Patient presents with  . URI    Virtual Visit via Telephone Note  I connected with Candice Mcgrath on 06/10/20 at 12:20 PM EDT by telephone and verified that I am speaking with the correct person using two identifiers.  Location: Patient: Car Provider: [facility] Medicine Unit    I discussed the limitations, risks, security and privacy concerns of performing an evaluation and management service by telephone and the availability of in person appointments. I also discussed with the patient that there may be a patient responsible charge related to this service. The patient expressed understanding and agreed to proceed.   History of Present Illness: SUBJECTIVE:  Candice Mcgrath is a 44 y.o. female who complains of sneezing and post nasal drip for 1 day. She denies a history of chest pain and fevers and admits to a history of allergies and has been working in her classroom getting it ready and describes it as dusty. Reports that she woke up with a tickle in her throat, did use Flonase, singulair and her inhaler last night. Reports that she has not recovered much of her sense of smell after having Covid-19 in Dec 2020.  Has been fully vaccinated against Covid.      Observations/Objective: Medical history and current medications reviewed, no physical exam completed    Past Medical History:  Diagnosis Date  . Allergic rhinitis   . Drug abuse ([place])    adderrall, Xanax, MJ, others, no IV  . ETOH abuse    initially sober 2007, [facility]  . Herpes   . Hyperlipidemia   . IBS (irritable bowel syndrome)   . Migraine headache 12/24/2012  . Nephrolithiasis   . Pre-diabetes     Past Surgical History:  Procedure Laterality Date  . Basket Surgery  2005, 06   kidney stones  . TYMPANOSTOMY TUBE PLACEMENT  Bilateral    as a child    Family History  Problem Relation Age of Onset  . Stroke Maternal Grandmother   . Cancer Maternal Grandfather   . Heart disease Paternal Grandfather   . Alcohol abuse Other        GP  . Breast cancer Cousin   . Stroke Other        [place]    Social History   Socioeconomic History  . Marital status: Married    Spouse name: Not on file  . Number of children: Not on file  . Years of education: Not on file  . Highest education level: Not on file  Occupational History  . Occupation: spanish    Employer: [employer]    Comment: @ [employer]  Tobacco Use  . Smoking status: Former Smoker    Quit date: 06/24/2016    Years since quitting: 3.9  . Smokeless tobacco: Former Systems developer    Quit date: 12/04/2008  Substance and Sexual Activity  . Alcohol use: Yes    Alcohol/week: 2.0 - 4.0 standard drinks    Types: 2 - 4 Cans of beer per week    Comment: recovering AA  . Drug use: No    Comment: Recovering NA  . Sexual activity: Not on file  Other Topics Concern  . Not on file  Social History Narrative  . Not on file   Social Determinants of Health  Financial Resource Strain:   . Difficulty of Paying Living Expenses: Not on file  Food Insecurity:   . Worried About Charity fundraiser in the Last Year: Not on file  . Ran Out of Food in the Last Year: Not on file  Transportation Needs:   . Lack of Transportation (Medical): Not on file  . Lack of Transportation (Non-Medical): Not on file  Physical Activity:   . Days of Exercise per Week: Not on file  . Minutes of Exercise per Session: Not on file  Stress:   . Feeling of Stress : Not on file  Social Connections:   . Frequency of Communication with Friends and Family: Not on file  . Frequency of Social Gatherings with Friends and Family: Not on file  . Attends Religious Services: Not on file  . Active Member of Clubs or Organizations: Not on file  . Attends Archivist Meetings:  Not on file  . Marital Status: Not on file  Intimate Partner Violence:   . Fear of Current or Ex-Partner: Not on file  . Emotionally Abused: Not on file  . Physically Abused: Not on file  . Sexually Abused: Not on file    ROS Review of Systems  Constitutional: Negative for chills, fatigue and fever.  HENT: Positive for sneezing. Negative for sinus pressure, sinus pain and sore throat.   Eyes: Negative.   Respiratory: Negative for shortness of breath and wheezing.   Gastrointestinal: Negative.  Negative for diarrhea, nausea and vomiting.  Endocrine: Negative.   Genitourinary: Negative.   Musculoskeletal: Negative.   Skin: Negative.   Allergic/Immunologic: Positive for environmental allergies.  Neurological: Negative.   Hematological: Negative.   Psychiatric/Behavioral: Negative.     Objective:   Today's Vitals: There were no vitals taken for this visit.    Assessment & Plan:   Problem List Items Addressed This Visit      Respiratory   Allergic rhinitis - Primary   Relevant Orders   POC COVID-19 (Completed)   SARS-COV-2, NAA 2 DAY TAT      Outpatient Encounter Medications as of 06/10/2020  Medication Sig  . albuterol (PROAIR HFA) 108 (90 Base) MCG/ACT inhaler INHALE 2 PUFFS INTO THE LUNGS EVERY 4 (FOUR) HOURS AS NEEDED. USE SPARINGLY  . atomoxetine (STRATTERA) 40 MG capsule Take 1 capsule (40 mg total) by mouth 2 (two) times daily.  Marland Kitchen azelastine (ASTELIN) 0.1 % nasal spray Place 1 spray into both nostrils 2 (two) times daily. Use in each nostril as directed  . buPROPion (WELLBUTRIN XL) 150 MG 24 hr tablet TAKE 1 TABLET(150 MG) BY MOUTH DAILY  . EPINEPHrine 0.3 mg/0.3 mL IJ SOAJ injection USE UTD PRF SYSTEMATIC REACTION.  Marland Kitchen escitalopram (LEXAPRO) 10 MG tablet TAKE 1 TABLET(10 MG) BY MOUTH DAILY  . fluticasone (FLONASE) 50 MCG/ACT nasal spray Place 1 spray into both nostrils daily.  . hyoscyamine (LEVSIN SL) 0.125 MG SL tablet Place 1 tablet (0.125 mg total) under the  tongue every 4 (four) hours as needed.  Marland Kitchen levocetirizine (XYZAL) 5 MG tablet SMARTSIG:1 Tablet(s) By Mouth Every Evening  . lidocaine-prilocaine (EMLA) cream APPLY TOPICALLY 4 (FOUR) TIMES DAILY AS NEEDED. FOUR TIMES A DAY TO AFFECTED AREA, 1 MONTH SUPPLY  . meloxicam (MOBIC) 15 MG tablet TAKE 1 TABLET(15 MG) BY MOUTH DAILY WITH FOOD AS NEEDED FOR PAIN  . montelukast (SINGULAIR) 10 MG tablet TK 1 T PO QD  . PAZEO 0.7 % SOLN   . SYMBICORT 160-4.5 MCG/ACT inhaler INL  2 PFS PO BID  . tiZANidine (ZANAFLEX) 4 MG tablet Take 1 tablet (4 mg total) by mouth at bedtime.  . triamcinolone cream (KENALOG) 0.1 % Apply 1 application topically 2 (two) times daily.  . valACYclovir (VALTREX) 500 MG tablet Take 1 tablet (500 mg total) by mouth 2 (two) times daily. For 3 days during a flare   No facility-administered encounter medications on file as of 06/10/2020.   Assessment and Plan: 1. Allergic rhinitis, unspecified seasonality, unspecified trigger  Rapid Covid-19 testing negative  PLAN: Symptomatic therapy suggested: push fluids, rest and return office visit prn if symptoms persist or worsen. Lack of antibiotic effectiveness discussed with her. Call or return to clinic prn if these symptoms worsen or fail to improve as anticipated.   Follow Up Instructions:    I discussed the assessment and treatment plan with the patient. The patient was provided an opportunity to ask questions and all were answered. The patient agreed with the plan and demonstrated an understanding of the instructions.   The patient was advised to call back or seek an in-person evaluation if the symptoms worsen or if the condition fails to improve as anticipated.  I provided 21 minutes of non-face-to-face time during this encounter.   Taevion Sikora S Mayers, PA-C    Follow-up: Return if symptoms worsen or fail to improve.   Loraine Grip Mayers, PA-C

## 2020-06-12 LAB — NOVEL CORONAVIRUS, NAA: SARS-CoV-2, NAA: NOT DETECTED

## 2020-06-17 NOTE — Progress Notes (Signed)
Per [facility] WEBSITE REPORTS: Patient results or NOT DETECTED for C-19

## 2020-06-25 ENCOUNTER — Other Ambulatory Visit: Payer: Self-pay | Admitting: Family Medicine

## 2020-06-29 NOTE — Telephone Encounter (Signed)
Last office visit 12/04/2019 for CPE.  Last refilled 09/25/2019 for #60 with 5 refills.  No future appointments.

## 2020-07-01 ENCOUNTER — Other Ambulatory Visit: Payer: Self-pay | Admitting: Family Medicine

## 2020-07-01 NOTE — Telephone Encounter (Signed)
Last office visit 12/04/2019 for CPE.  Last refilled 01/12/2020 for #90 with 1 refill.  No future appointments.

## 2020-07-03 ENCOUNTER — Other Ambulatory Visit: Payer: Self-pay | Admitting: Family Medicine

## 2020-08-09 ENCOUNTER — Encounter: Payer: Self-pay | Admitting: Family Medicine

## 2020-08-10 MED ORDER — LIDOCAINE-PRILOCAINE 2.5-2.5 % EX CREA: TOPICAL_CREAM | CUTANEOUS | 2 refills | Status: AC

## 2020-08-10 MED ORDER — HYOSCYAMINE SULFATE 0.125 MG SL SUBL: 0.1250 mg | SUBLINGUAL_TABLET | SUBLINGUAL | 11 refills | Status: DC | PRN

## 2020-10-04 ENCOUNTER — Other Ambulatory Visit: Payer: Self-pay

## 2020-10-04 ENCOUNTER — Emergency Department: Admit: 2020-10-04 | Discharge status: Absent without leave | Payer: Self-pay

## 2020-10-04 ENCOUNTER — Emergency Department (INDEPENDENT_AMBULATORY_CARE_PROVIDER_SITE_OTHER)
Admission: EM | Admit: 2020-10-04 | Discharge: 2020-10-04 | Disposition: A | Discharge status: Home / Self Care | Payer: BC Managed Care – PPO | Source: Home / Self Care

## 2020-10-04 DIAGNOSIS — G43009 Migraine without aura, not intractable, without status migrainosus: Secondary | ICD-10-CM

## 2020-10-04 MED ORDER — KETOROLAC TROMETHAMINE 60 MG/2ML IM SOLN
60.0000 mg | Freq: Once | INTRAMUSCULAR | Status: AC
  Administered 2020-10-04: 60 mg via INTRAMUSCULAR

## 2020-10-04 NOTE — ED Provider Notes (Signed)
[facility]    CSN: [id number] Arrival date & time: 10/04/20  1910      History   Chief Complaint Chief Complaint  Patient presents with  . Headache    HPI Candice Mcgrath is a 44 y.o. female.   Patient has history of migraine.  She is followed by neurologist and apparently is getting some botulinum injections.  She does not have anything at home to take for headache.  Injections seem to have been helping.  She does not describe an aura and has had no nausea or vomiting.  HPI  Past Medical History:  Diagnosis Date  . Allergic rhinitis   . Drug abuse ([place])    adderrall, Xanax, MJ, others, no IV  . ETOH abuse    initially sober 2007, [facility]  . Herpes   . Hyperlipidemia   . IBS (irritable bowel syndrome)   . Migraine headache 12/24/2012  . Nephrolithiasis   . Pre-diabetes     Patient Active Problem List   Diagnosis Date Noted  . Generalized anxiety disorder 09/02/2014  . Major depressive disorder, recurrent episode, in partial remission ([place]) 05/18/2014  . Personal history of drug dependence ([place]) 12/24/2012  . Migraine headache 12/24/2012  . ASTHMA 06/23/2010  . IBS 12/13/2009  . Hyperlipidemia 11/08/2009  . ADD 11/08/2009  . Allergic rhinitis 11/01/2008  . NEPHROLITHIASIS, HX OF 11/01/2008  . GENITAL HERPES 10/30/2008  . Personal history of alcoholism ([place]) 10/30/2008    Past Surgical History:  Procedure Laterality Date  . Basket Surgery  2005, 06   kidney stones  . TYMPANOSTOMY TUBE PLACEMENT Bilateral    as a child    OB History   No obstetric history on file.      Home Medications    Prior to Admission medications   Medication Sig Start Date End Date Taking? Authorizing Provider  albuterol (PROAIR HFA) 108 (90 Base) MCG/ACT inhaler INHALE 2 PUFFS INTO THE LUNGS EVERY 4 (FOUR) HOURS AS NEEDED. USE SPARINGLY 03/06/16   Copland, Frederico Hamman, MD  atomoxetine (STRATTERA) 40 MG capsule TAKE 1 CAPSULE(40 MG) BY MOUTH TWICE DAILY  06/29/20   Copland, Frederico Hamman, MD  azelastine (ASTELIN) 0.1 % nasal spray Place 1 spray into both nostrils 2 (two) times daily. Use in each nostril as directed    [provider]  buPROPion (WELLBUTRIN XL) 150 MG 24 hr tablet TAKE 1 TABLET(150 MG) BY MOUTH DAILY 07/05/20   Copland, Frederico Hamman, MD  EPINEPHrine 0.3 mg/0.3 mL IJ SOAJ injection USE UTD PRF SYSTEMATIC REACTION. 04/06/17   [provider]  escitalopram (LEXAPRO) 10 MG tablet TAKE 1 TABLET(10 MG) BY MOUTH DAILY 05/24/20   Copland, Frederico Hamman, MD  fluticasone (FLONASE) 50 MCG/ACT nasal spray Place 1 spray into both nostrils daily. 03/06/16   Copland, Frederico Hamman, MD  hyoscyamine (LEVSIN SL) 0.125 MG SL tablet Place 1 tablet (0.125 mg total) under the tongue every 4 (four) hours as needed. 08/10/20   Copland, Frederico Hamman, MD  levocetirizine (XYZAL) 5 MG tablet SMARTSIG:1 Tablet(s) By Mouth Every Evening 05/22/20   [provider]  lidocaine-prilocaine (EMLA) cream APPLY TOPICALLY 4 (FOUR) TIMES DAILY AS NEEDED. FOUR TIMES A DAY TO AFFECTED AREA, 1 MONTH SUPPLY 08/10/20   Copland, Frederico Hamman, MD  meloxicam (MOBIC) 15 MG tablet TAKE 1 TABLET(15 MG) BY MOUTH DAILY WITH FOOD AS NEEDED FOR PAIN 07/28/19   Copland, Frederico Hamman, MD  montelukast (SINGULAIR) 10 MG tablet TK 1 T PO QD 02/25/19   [provider]  PAZEO 0.7 %  SOLN  04/06/17   [provider]  SYMBICORT 160-4.5 MCG/ACT inhaler INL 2 PFS PO BID 02/25/19   [provider]  tiZANidine (ZANAFLEX) 4 MG tablet TAKE 1 TABLET(4 MG) BY MOUTH AT BEDTIME 07/01/20   Copland, Spencer, MD  triamcinolone cream (KENALOG) 0.1 % Apply 1 application topically 2 (two) times daily. 11/23/19   Copland, Frederico Hamman, MD  valACYclovir (VALTREX) 500 MG tablet Take 1 tablet (500 mg total) by mouth 2 (two) times daily. For 3 days during a flare 09/27/17   Copland, Frederico Hamman, MD    Family History Family History  Problem Relation Age of Onset  . Stroke Maternal Grandmother   . Cancer Maternal Grandfather    . Heart disease Paternal Grandfather   . Alcohol abuse Other        GP  . Breast cancer Cousin   . Stroke Other        [place]    Social History Social History   Tobacco Use  . Smoking status: Former Smoker    Quit date: 06/24/2016    Years since quitting: 4.2  . Smokeless tobacco: Former Systems developer    Quit date: 12/04/2008  Substance Use Topics  . Alcohol use: Yes    Alcohol/week: 2.0 - 4.0 standard drinks    Types: 2 - 4 Cans of beer per week    Comment: recovering AA  . Drug use: No    Comment: Recovering NA     Allergies   Sulfamethoxazole-trimethoprim   Review of Systems Review of Systems  Gastrointestinal: Negative for nausea and vomiting.  Neurological: Positive for headaches.  All other systems reviewed and are negative.    Physical Exam Triage Vital Signs ED Triage Vitals [10/04/20 1923]  Enc Vitals Group     BP 129/90     Pulse Rate (!) 104     Resp 18     Temp (!) 97.4 F (36.3 C)     Temp Source Oral     SpO2 97 %     Weight      Height      Head Circumference      Peak Flow      Pain Score      Pain Loc      Pain Edu?      Excl. in [place]?    No data found.  Updated Vital Signs BP 129/90 (BP Location: Right Arm)   Pulse (!) 104   Temp (!) 97.4 F (36.3 C) (Oral)   Resp 18   LMP 10/03/2020   SpO2 97%   Visual Acuity Right Eye Distance:   Left Eye Distance:   Bilateral Distance:    Right Eye Near:   Left Eye Near:    Bilateral Near:     Physical Exam Vitals and nursing note reviewed.  Constitutional:      Appearance: She is well-developed.  HENT:     Head: Normocephalic.  Eyes:     General: No visual field deficit.    Extraocular Movements: Extraocular movements intact.     Pupils: Pupils are equal, round, and reactive to light.  Cardiovascular:     Rate and Rhythm: Normal rate and regular rhythm.  Pulmonary:     Effort: Pulmonary effort is normal.     Breath sounds: Normal breath sounds.  Neurological:     Mental Status: She  is alert and oriented to person, place, and time. Mental status is at baseline.     Cranial Nerves: No cranial  nerve deficit, dysarthria or facial asymmetry.     Deep Tendon Reflexes: Reflexes normal.  Psychiatric:        Behavior: Behavior normal.      UC Treatments / Results  Labs (all labs ordered are listed, but only abnormal results are displayed) Labs Reviewed - No data to display  EKG   Radiology No results found.  Procedures Procedures (including critical care time)  Medications Ordered in UC Medications - No data to display  Initial Impression / Assessment and Plan / UC Course  I have reviewed the triage vital signs and the nursing notes.  Pertinent labs & imaging results that were available during my care of the patient were reviewed by me and considered in my medical decision making (see chart for details).     Migraine headache Final Clinical Impressions(s) / UC Diagnoses   Final diagnoses:  None   Discharge Instructions   None    ED Prescriptions    None     PDMP not reviewed this encounter.   Wardell Honour, MD 10/04/20 1944

## 2020-10-04 NOTE — ED Triage Notes (Signed)
Pt c/o headache x 2 days. See nuero Dr Domingo Cocking every 3 months for injections. Motrin and tylenol tried with no relief. Nauseated.

## 2020-10-31 ENCOUNTER — Other Ambulatory Visit: Payer: Self-pay | Admitting: Family Medicine

## 2020-11-01 NOTE — Telephone Encounter (Signed)
Please call and schedule CPE with Dr. Lorelei Pont for the end of Feb 2022.

## 2020-11-01 NOTE — Telephone Encounter (Signed)
Left voice message to call the office  

## 2020-11-22 ENCOUNTER — Other Ambulatory Visit: Payer: Self-pay | Admitting: Family Medicine

## 2020-11-22 DIAGNOSIS — E782 Mixed hyperlipidemia: Secondary | ICD-10-CM

## 2020-11-22 DIAGNOSIS — Z1159 Encounter for screening for other viral diseases: Secondary | ICD-10-CM

## 2020-11-22 DIAGNOSIS — Z131 Encounter for screening for diabetes mellitus: Secondary | ICD-10-CM

## 2020-11-22 DIAGNOSIS — Z79899 Other long term (current) drug therapy: Secondary | ICD-10-CM

## 2020-11-29 ENCOUNTER — Other Ambulatory Visit (INDEPENDENT_AMBULATORY_CARE_PROVIDER_SITE_OTHER): Payer: BC Managed Care – PPO

## 2020-11-29 ENCOUNTER — Other Ambulatory Visit: Payer: Self-pay

## 2020-11-29 DIAGNOSIS — Z1159 Encounter for screening for other viral diseases: Secondary | ICD-10-CM

## 2020-11-29 DIAGNOSIS — Z131 Encounter for screening for diabetes mellitus: Secondary | ICD-10-CM

## 2020-11-29 DIAGNOSIS — Z79899 Other long term (current) drug therapy: Secondary | ICD-10-CM | POA: Diagnosis not present

## 2020-11-29 DIAGNOSIS — E782 Mixed hyperlipidemia: Secondary | ICD-10-CM

## 2020-11-29 LAB — CBC WITH DIFFERENTIAL/PLATELET
Basophils Absolute: 0.1 10*3/uL (ref 0.0–0.1)
Basophils Relative: 0.8 % (ref 0.0–3.0)
Eosinophils Absolute: 0.2 10*3/uL (ref 0.0–0.7)
Eosinophils Relative: 2.2 % (ref 0.0–5.0)
HCT: 41.8 % (ref 36.0–46.0)
Hemoglobin: 14 g/dL (ref 12.0–15.0)
Lymphocytes Relative: 18.8 % (ref 12.0–46.0)
Lymphs Abs: 1.6 10*3/uL (ref 0.7–4.0)
MCHC: 33.6 g/dL (ref 30.0–36.0)
MCV: 89.2 fl (ref 78.0–100.0)
Monocytes Absolute: 0.6 10*3/uL (ref 0.1–1.0)
Monocytes Relative: 7.6 % (ref 3.0–12.0)
Neutro Abs: 5.9 10*3/uL (ref 1.4–7.7)
Neutrophils Relative %: 70.6 % (ref 43.0–77.0)
Platelets: 288 10*3/uL (ref 150.0–400.0)
RBC: 4.69 Mil/uL (ref 3.87–5.11)
RDW: 14 % (ref 11.5–15.5)
WBC: 8.3 10*3/uL (ref 4.0–10.5)

## 2020-11-29 LAB — BASIC METABOLIC PANEL
BUN: 15 mg/dL (ref 6–23)
CO2: 23 mEq/L (ref 19–32)
Calcium: 9 mg/dL (ref 8.4–10.5)
Chloride: 105 mEq/L (ref 96–112)
Creatinine, Ser: 0.79 mg/dL (ref 0.40–1.20)
GFR: 91.78 mL/min (ref >60.00)
Glucose, Bld: 112 mg/dL — ABNORMAL HIGH (ref 70–99)
Potassium: 4.2 mEq/L (ref 3.5–5.1)
Sodium: 136 mEq/L (ref 135–145)

## 2020-11-29 LAB — LIPID PANEL
Cholesterol: 155 mg/dL (ref 0–200)
HDL: 44.9 mg/dL (ref >39.00)
LDL Cholesterol: 96 mg/dL (ref 0–99)
NonHDL: 110.39
Total CHOL/HDL Ratio: 3
Triglycerides: 72 mg/dL (ref 0.0–149.0)
VLDL: 14.4 mg/dL (ref 0.0–40.0)

## 2020-11-29 LAB — HEPATIC FUNCTION PANEL
ALT: 17 U/L (ref 0–35)
AST: 15 U/L (ref 0–37)
Albumin: 4.1 g/dL (ref 3.5–5.2)
Alkaline Phosphatase: 75 U/L (ref 39–117)
Bilirubin, Direct: 0.1 mg/dL (ref 0.0–0.3)
Total Bilirubin: 0.3 mg/dL (ref 0.2–1.2)
Total Protein: 6.7 g/dL (ref 6.0–8.3)

## 2020-11-29 LAB — HEMOGLOBIN A1C: Hgb A1c MFr Bld: 6.6 % — ABNORMAL HIGH (ref 4.6–6.5)

## 2020-11-29 LAB — TSH: TSH: 2.08 u[IU]/mL (ref 0.35–4.50)

## 2020-11-30 LAB — HEPATITIS C ANTIBODY
Hepatitis C Ab: NONREACTIVE
SIGNAL TO CUT-OFF: 0.01 (ref <1.00)

## 2020-12-06 ENCOUNTER — Other Ambulatory Visit: Payer: Self-pay

## 2020-12-06 ENCOUNTER — Ambulatory Visit (INDEPENDENT_AMBULATORY_CARE_PROVIDER_SITE_OTHER): Payer: BC Managed Care – PPO | Admitting: Family Medicine

## 2020-12-06 ENCOUNTER — Encounter: Payer: Self-pay | Admitting: Family Medicine

## 2020-12-06 VITALS — BP 100/80 | HR 90 | Temp 97.8°F | Ht 61.0 in | Wt 210.5 lb

## 2020-12-06 DIAGNOSIS — Z0000 Encounter for general adult medical examination without abnormal findings: Secondary | ICD-10-CM | POA: Diagnosis not present

## 2020-12-06 DIAGNOSIS — E119 Type 2 diabetes mellitus without complications: Secondary | ICD-10-CM | POA: Insufficient documentation

## 2020-12-06 DIAGNOSIS — J4530 Mild persistent asthma, uncomplicated: Secondary | ICD-10-CM | POA: Insufficient documentation

## 2020-12-06 MED ORDER — ESCITALOPRAM OXALATE 20 MG PO TABS
20.0000 mg | ORAL_TABLET | Freq: Every day | ORAL | 3 refills | Status: DC
Start: 2020-12-06 — End: 2021-12-07

## 2020-12-06 NOTE — Progress Notes (Signed)
Candice Dingee [name], MD, [place] [facility] [place], [address]  Phone: [phone]  FAX: [place] - 45 y.o. female  MRN [id number]  Date of Birth: 09-13-1976  Date: 12/06/2020  PCP: Owens Loffler, MD  Referral: Owens Loffler, MD  Chief Complaint  Patient presents with  . Annual Exam    This visit occurred during the SARS-CoV-2 public health emergency.  Safety protocols were in place, including screening questions prior to the visit, additional usage of staff PPE, and extensive cleaning of exam room while observing appropriate contact time as indicated for disinfecting solutions.   Patient Care Team: Owens Loffler, MD as PCP - General Marylynn Pearson, MD as Consulting Physician (Obstetrics and Gynecology) Lowella Bandy, MD (Inactive) as Consulting Physician (Urology) Subjective:   Candice Mcgrath is a 45 y.o. pleasant patient who presents with the following:  Health Maintenance Summary Reviewed and updated, unless pt declines services.  Tobacco History Reviewed. Non-smoker Alcohol: No concerns, no excessive use.  She has remained completely abstinent for 2 years.  She is returned to be active in [place], she does have a sponsor. Exercise Habits: Currently limited  STD concerns: none Drug Use: None Lumps or breast concerns: no  Lexapro is at 10 mg.  She is having some increased irritability, particularly in the home place, and this is predominantly centered around her menses.  New onset DM: Her hemoglobin A1c on this office visit was 6.6.  Previously she had no known diabetes.  Over the years, she has gained some weight.  She is completely asymptomatic.  Health Maintenance  Topic Date Due  . PNEUMOCOCCAL POLYSACCHARIDE VACCINE AGE 65-64 HIGH RISK  Never done  . FOOT EXAM  Never done  . OPHTHALMOLOGY EXAM  Never done  . URINE  MICROALBUMIN  Never done  . HEMOGLOBIN A1C  05/29/2021  . PAP SMEAR-Modifier  12/03/2022  . TETANUS/TDAP  04/05/2027  . INFLUENZA VACCINE  Completed  . COVID-19 Vaccine  Completed  . Hepatitis C Screening  Completed  . HIV Screening  Completed    Immunization History  Administered Date(s) Administered  . Hep A / Hep B 04/04/2017  . Influenza, High Dose Seasonal PF 09/07/2016, 09/25/2018, 09/24/2019, 09/09/2020  . Influenza, Seasonal, Injecte, Preservative Fre 09/02/2014  . Influenza,inj,Quad PF,6+ Mos 07/10/2013, 07/24/2016, 07/19/2019  . PFIZER(Purple Top)SARS-COV-2 Vaccination 12/19/2019, 01/10/2020, 03/29/2020, 09/09/2020  . Tdap 04/04/2017   Patient Active Problem List   Diagnosis Date Noted  . Diabetes mellitus type 2, diet-controlled ([place]) 12/06/2020    Priority: High  . Mild persistent asthma, uncomplicated 74/05/1447    Priority: Medium  . Generalized anxiety disorder 09/02/2014    Priority: Medium  . Major depressive disorder, recurrent episode, in partial remission ([place]) 05/18/2014    Priority: Medium  . Personal history of drug dependence ([place]) 12/24/2012    Priority: Medium  . Migraine headache 12/24/2012    Priority: Medium  . Personal history of alcoholism ([place]) 10/30/2008    Priority: Medium  . IBS 12/13/2009  . Hyperlipidemia 11/08/2009  . ADD 11/08/2009  . Allergic rhinitis 11/01/2008  . NEPHROLITHIASIS, HX OF 11/01/2008  . GENITAL HERPES 10/30/2008    Past Medical History:  Diagnosis Date  . Allergic rhinitis   . Herpes   . History of alcohol abuse    initially sober 2007, [facility]  . History of drug abuse in remission ([place])  adderrall, Xanax, MJ, others, no IV  . Hyperlipidemia   . IBS (irritable bowel syndrome)   . Migraine headache 12/24/2012  . Nephrolithiasis     Past Surgical History:  Procedure Laterality Date  . Basket Surgery  2005, 06   kidney stones  . TYMPANOSTOMY TUBE PLACEMENT Bilateral    as a child    Family  History  Problem Relation Age of Onset  . Stroke Maternal Grandmother   . Cancer Maternal Grandfather   . Heart disease Paternal Grandfather   . Alcohol abuse Other        GP  . Breast cancer Cousin   . Stroke Other        [place]    Past Medical History, Surgical History, Social History, Family History, Problem List, Medications, and Allergies have been reviewed and updated if relevant.  Review of Systems: Pertinent positives are listed above.  Otherwise, a full 14 point review of systems has been done in full and it is negative except where it is noted positive.  Objective:   BP 100/80   Pulse 90   Temp 97.8 F (36.6 C) (Temporal)   Ht 5\' 1"  (1.549 m)   Wt 210 lb 8 oz (95.5 kg)   SpO2 97%   BMI 39.77 kg/m  Ideal Body Weight: Weight in (lb) to have BMI = 25: 132 No exam data present Depression screen [facility] 2/9 12/04/2019 05/26/2019 05/20/2018 04/04/2017  Decreased Interest 0 1 0 0  Down, Depressed, Hopeless 1 1 0 1  PHQ - 2 Score 1 2 0 1  Altered sleeping 2 1 - -  Tired, decreased energy 1 1 - -  Change in appetite 1 2 - -  Feeling bad or failure about yourself  0 0 - -  Trouble concentrating 2 2 - -  Moving slowly or fidgety/restless 1 1 - -  Suicidal thoughts 0 0 - -  PHQ-9 Score 8 9 - -  Difficult doing work/chores Somewhat difficult Somewhat difficult - -     GEN: well developed, well nourished, no acute distress Eyes: conjunctiva and lids normal, PERRLA, EOMI ENT: TM clear, nares clear, oral exam WNL Neck: supple, no lymphadenopathy, no thyromegaly, no JVD Pulm: clear to auscultation and percussion, respiratory effort normal CV: regular rate and rhythm, S1-S2, no murmur, rub or gallop, no bruits Chest: no scars, masses, no lumps BREAST: breast exam declined GI: soft, non-tender; no hepatosplenomegaly, masses; active bowel sounds all quadrants GU: GU exam declined Lymph: no cervical, axillary or inguinal adenopathy MSK: gait normal, muscle tone and strength WNL, no  joint swelling, effusions, discoloration, crepitus  SKIN: clear, good turgor, color WNL, no rashes, lesions, or ulcerations Neuro: normal mental status, normal strength, sensation, and motion Psych: alert; oriented to person, place and time, normally interactive and not anxious or depressed in appearance.   All labs reviewed with patient. Results for orders placed or performed in visit on 11/29/20  Hepatitis C antibody  Result Value Ref Range   Hepatitis C Ab NON-REACTIVE NON-REACTI   SIGNAL TO CUT-OFF 0.01 <1.00  TSH  Result Value Ref Range   TSH 2.08 0.35 - 4.50 uIU/mL  Hemoglobin A1c  Result Value Ref Range   Hgb A1c MFr Bld 6.6 (H) 4.6 - 6.5 %  CBC with Differential/Platelet  Result Value Ref Range   WBC 8.3 4.0 - 10.5 K/uL   RBC 4.69 3.87 - 5.11 Mil/uL   Hemoglobin 14.0 12.0 - 15.0 g/dL   HCT 41.8 36.0 -  46.0 %   MCV 89.2 78.0 - 100.0 fl   MCHC 33.6 30.0 - 36.0 g/dL   RDW 14.0 11.5 - 15.5 %   Platelets 288.0 150.0 - 400.0 K/uL   Neutrophils Relative % 70.6 43.0 - 77.0 %   Lymphocytes Relative 18.8 12.0 - 46.0 %   Monocytes Relative 7.6 3.0 - 12.0 %   Eosinophils Relative 2.2 0.0 - 5.0 %   Basophils Relative 0.8 0.0 - 3.0 %   Neutro Abs 5.9 1.4 - 7.7 K/uL   Lymphs Abs 1.6 0.7 - 4.0 K/uL   Monocytes Absolute 0.6 0.1 - 1.0 K/uL   Eosinophils Absolute 0.2 0.0 - 0.7 K/uL   Basophils Absolute 0.1 0.0 - 0.1 K/uL  Basic metabolic panel  Result Value Ref Range   Sodium 136 135 - 145 mEq/L   Potassium 4.2 3.5 - 5.1 mEq/L   Chloride 105 96 - 112 mEq/L   CO2 23 19 - 32 mEq/L   Glucose, Bld 112 (H) 70 - 99 mg/dL   BUN 15 6 - 23 mg/dL   Creatinine, Ser 0.79 0.40 - 1.20 mg/dL   GFR 91.78 >60.00 mL/min   Calcium 9.0 8.4 - 10.5 mg/dL  Hepatic function panel  Result Value Ref Range   Total Bilirubin 0.3 0.2 - 1.2 mg/dL   Bilirubin, Direct 0.1 0.0 - 0.3 mg/dL   Alkaline Phosphatase 75 39 - 117 U/L   AST 15 0 - 37 U/L   ALT 17 0 - 35 U/L   Total Protein 6.7 6.0 - 8.3 g/dL    Albumin 4.1 3.5 - 5.2 g/dL  Lipid panel  Result Value Ref Range   Cholesterol 155 0 - 200 mg/dL   Triglycerides 72.0 0.0 - 149.0 mg/dL   HDL 44.90 >39.00 mg/dL   VLDL 14.4 0.0 - 40.0 mg/dL   LDL Cholesterol 96 0 - 99 mg/dL   Total CHOL/HDL Ratio 3    NonHDL 110.39    No results found.  Assessment and Plan:     ICD-10-CM   1. Healthcare maintenance  Z00.00   2. Diabetes mellitus type 2, diet-controlled ([place])  E11.9 Ambulatory referral to diabetic education   In terms of health maintenance, she is globally doing well and she is up-to-date on all of her vaccines and health maintenance.  I encouraged her to keep working on her diet and her weight.  We also discussed breast reduction.  We spent 12 minutes discussing the following: Addition to the patient's general health maintenance: She does have new onset type 2 diabetes.  Her A1c is 6.6.  At this point, I do think it is okay for her to continue to work on diet control alone.  I did place a diabetic education consultation, and reviewed new onset diabetes with her in the office.  Hopefully, she will able to stabilize with diet alone.    I do want her to follow-up in 3 months for additional A1c and further discussion of diabetes.  We also discussed basic diabetic diet:  Patient Instructions   [facility] Low Glycemic Diet (Source: [facility], 2006)  Low Glycemic Foods (20-49) (Decrease risk of developing heart disease)  Best for Diabetes: Eat Mostly these  Breakfast Cereals: All-Bran All-Bran Fruit 'n Oats Fiber One Oatmeal (not instant) Oat bran  Fruits and fruit juices: (Limit to 1-2 servings per day) Apples Apricots (fresh & dried) Blackberries Blueberries Cherries Cranberries Peaches Pears Plums Prunes Grapefruit Raspberries Strawberries Tangerine  Juices: Apple  juice Grapefruit juice Tomato juice  Beans and legumes (fresh-cooked): Black-eyed peas Butter beans Chick peas Lentils   Green beans Lima beans Kidney beans Navy beans Pinto beans Snow peas  Non-starchy vegetables: Asparagus, avocado, broccoli, cabbage, cauliflower, celery, cucumber, greens, lettuce, mushrooms, peppers, tomatoes, okra, onions, spinach, summer squash  Grains: Barley Bulgur Rye Wild rice  Nuts and oils : Almonds Peanuts Sunflower seeds Hazelnuts Pecans Walnuts Oils that are liquid at room temperature  Dairy, fish, meat, soy, and eggs: Milk, skim Lowfat cheese Yogurt, lowfat, fruit sugar sweetened Lean red meat Fish  Skinless chicken & [place] Shellfish Egg whites (up to 3 daily) Soy products  Egg yolks (up to 7 or _____ per week) Moderate Glycemic Foods (50-69)  OK sometimes with diabetes  Breakfast Cereals: Bran Buds Bran Chex Just Right Mini-Wheats  Special K Swiss muesli  Fruits: Banana (under-ripe) Dates Figs Grapes Kiwi Mango Oranges Raisins  Fruit Juices: Cranberry juice Orange juice  Beans and legumes: [place]-type baked beans Canned pinto, kidney, or navy beans Green peas  Vegetables: Beets Carrots  Sweet potato Yam Corn on the cob  Breads: Pita (pocket) bread Oat bran bread Pumpernickel bread Rye bread Wheat bread, high fiber   Grains: Cornmeal Rice, brown Rice, white Couscous  Pasta: Macaroni Pizza, cheese Ravioli, meat filled Spaghetti, white   Nuts: Cashews Macadamia  Snacks: Chocolate Ice cream, lowfat Muffin Popcorn High Glycemic Foods (70-100)  Rare: Eat occaisionally with diabetes  THESE ARE THE WORST KIND OF FOODS FOR YOUR DIABETES  Breakfast Cereals: Cheerios Corn Chex Corn Flakes Cream of Wheat Grape Nuts Grape Nut Flakes Grits Nutri-Grain Puffed Rice Puffed Wheat Rice Chex Rice Krispies Shredded Wheat Team Total  Fruits: Pineapple Watermelon Banana (over-ripe) Beverages: Sodas, sweet tea, pineapple juice  Vegetables: Potato, baked, boiled, fried, mashed [place] fries Canned or frozen corn Parsnips Winter  squash  Breads: Most breads (white and whole grain) Bagels Bread sticks Bread stuffing Kaiser roll Dinner rolls  Grains: Rice, instant Tapioca, with milk Candy and most cookies  Snacks: Donuts Corn chips Jelly beans Pretzels Pastries        Health Maintenance Exam: The patient's preventative maintenance and recommended screening tests for an annual wellness exam were reviewed in full today. Brought up to date unless services declined.  Counselled on the importance of diet, exercise, and its role in overall health and mortality. The patient's FH and SH was reviewed, including their home life, tobacco status, and drug and alcohol status.  Follow-up in 1 year for physical exam or additional follow-up below.  Follow-up: Return in about 3 months (around 03/05/2021). Or follow-up in 1 year if not noted.  Future Appointments  Date Time Provider [place]  03/07/2021  9:00 AM Anyssa Sharpless, Frederico Hamman, MD [facility]    Meds ordered this encounter  Medications  . escitalopram (LEXAPRO) 20 MG tablet    Sig: Take 1 tablet (20 mg total) by mouth daily.    Dispense:  90 tablet    Refill:  3   Medications Discontinued During This Encounter  Medication Reason  . escitalopram (LEXAPRO) 10 MG tablet   . buPROPion (WELLBUTRIN XL) 150 MG 24 hr tablet    Orders Placed This Encounter  Procedures  . Ambulatory referral to diabetic education    Signed,  Frederico Hamman T. Chinmayi Rumer, MD   Allergies as of 12/06/2020      Reactions   Sulfamethoxazole-trimethoprim    REACTION: itching      Medication List  Accurate as of December 06, 2020 11:59 PM. If you have any questions, ask your nurse or doctor.        STOP taking these medications   buPROPion 150 MG 24 hr tablet Commonly known as: WELLBUTRIN XL Stopped by: Owens Loffler, MD     TAKE these medications   albuterol 108 (90 Base) MCG/ACT inhaler Commonly known as: ProAir HFA INHALE 2 PUFFS INTO THE LUNGS EVERY 4  (FOUR) HOURS AS NEEDED. USE SPARINGLY   atomoxetine 40 MG capsule Commonly known as: STRATTERA TAKE 1 CAPSULE(40 MG) BY MOUTH TWICE DAILY   azelastine 0.1 % nasal spray Commonly known as: ASTELIN Place 1 spray into both nostrils 2 (two) times daily. Use in each nostril as directed   chlorproMAZINE 25 MG tablet Commonly known as: THORAZINE   EPINEPHrine 0.3 mg/0.3 mL Soaj injection Commonly known as: EPI-PEN USE UTD PRF SYSTEMATIC REACTION.   escitalopram 20 MG tablet Commonly known as: Lexapro Take 1 tablet (20 mg total) by mouth daily. What changed:   medication strength  See the new instructions. Changed by: Owens Loffler, MD   fluticasone 50 MCG/ACT nasal spray Commonly known as: FLONASE Place 1 spray into both nostrils daily.   hyoscyamine 0.125 MG SL tablet Commonly known as: LEVSIN SL Place 1 tablet (0.125 mg total) under the tongue every 4 (four) hours as needed.   levocetirizine 5 MG tablet Commonly known as: XYZAL SMARTSIG:1 Tablet(s) By Mouth Every Evening   lidocaine-prilocaine cream Commonly known as: EMLA APPLY TOPICALLY 4 (FOUR) TIMES DAILY AS NEEDED. FOUR TIMES A DAY TO AFFECTED AREA, 1 MONTH SUPPLY   meloxicam 15 MG tablet Commonly known as: MOBIC TAKE 1 TABLET(15 MG) BY MOUTH DAILY WITH FOOD AS NEEDED FOR PAIN   montelukast 10 MG tablet Commonly known as: SINGULAIR TK 1 T PO QD   Pazeo 0.7 % Soln Generic drug: Olopatadine HCl   SUMAtriptan 100 MG tablet Commonly known as: IMITREX Take by mouth.   Symbicort 160-4.5 MCG/ACT inhaler Generic drug: budesonide-formoterol INL 2 PFS PO BID   tiZANidine 4 MG tablet Commonly known as: ZANAFLEX TAKE 1 TABLET(4 MG) BY MOUTH AT BEDTIME   triamcinolone 0.1 % Commonly known as: KENALOG Apply 1 application topically 2 (two) times daily.   valACYclovir 500 MG tablet Commonly known as: VALTREX Take 1 tablet (500 mg total) by mouth 2 (two) times daily. For 3 days during a flare

## 2020-12-06 NOTE — Patient Instructions (Signed)

## 2020-12-22 ENCOUNTER — Other Ambulatory Visit: Payer: Self-pay | Admitting: Family Medicine

## 2020-12-23 NOTE — Telephone Encounter (Signed)
Last office visit 12/06/2020 for CPE.  Last refilled 07/01/2020 for #90 with 1 refill.  Next Appt: 03/07/21 for 3 month follow up.

## 2021-01-18 ENCOUNTER — Other Ambulatory Visit: Payer: Self-pay

## 2021-01-18 ENCOUNTER — Encounter: Payer: BC Managed Care – PPO | Attending: Family Medicine | Admitting: Dietician

## 2021-01-18 ENCOUNTER — Encounter: Payer: Self-pay | Admitting: Dietician

## 2021-01-18 DIAGNOSIS — E119 Type 2 diabetes mellitus without complications: Secondary | ICD-10-CM | POA: Diagnosis not present

## 2021-01-18 NOTE — Patient Instructions (Signed)
Check out Diabetes.org/recipes for good diabetes recipes.  Fire up that [employer] and look up the Diabetes Instant Pot recipe book.  Keep up the walks. Try to increase your walks to 3-4 times a week.  Work towards eating three meals a day, about 5-6 hours apart!  Begin to recognize carbohydrates in your food choices!  Have 3 carb choices at each meal (45 g).   Begin to build your meals using the proportions of the Balanced Plate. . First, select your carb choice(s) for the meal, and determine how much you should have to equal 3 carb choices (45 g). . Next, select your source of protein to pair with your carb choice(s). . Finally, complete the remaining half of your meal with a variety of non-starchy vegetables.

## 2021-01-18 NOTE — Progress Notes (Signed)
Diabetes Self-Management Education  Visit Type: First/Initial  Appt. Start Time: 1430 Appt. End Time: 8185  01/18/2021  Ms. Candice Mcgrath, identified by name and date of birth, is a 45 y.o. female with a diagnosis of Diabetes: Type 2.   ASSESSMENT  Pt reports being prediabetic for a long time, but finally their A1c has risen above 6.4. Pt reports having large amount of stress at home. Pt reports stopping drinking alcohol in 2020, but reports switching to sugar as their vice. 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 Pt reports their health as "fair", states they need to stop smoking (10/day for a couple of years), became sober last year, and states they are overweight. Pt reports walking for over 30 minutes twice a week. Pt reports using Weight Watchers to track food currently. Pt reports struggling on varying what they eat. Pt states when they meal prep they do well with nutrition, but struggles when they get away from home. Pt reports emotional eating at times, states they will overconsume in times of stress.  Height 5\' 1"  (1.549 m), weight 204 lb 8 oz (92.8 kg). Body mass index is 38.64 kg/m.   Diabetes Self-Management Education - 01/18/21 1455      Visit Information   Visit Type First/Initial      Initial Visit   Diabetes Type Type 2    Are you currently following a meal plan? Yes    What type of meal plan do you follow? Weight Watchers    Are you taking your medications as prescribed? No    Date Diagnosed 12/14/20      Health Coping   How would you rate your overall health? Fair      Psychosocial Assessment   Patient Belief/Attitude about Diabetes Motivated to manage diabetes    Self-management support Doctor's office    Other persons present Patient    Patient Concerns Weight Control;Nutrition/Meal planning    Special Needs None    Preferred Learning Style No preference indicated    Learning Readiness Ready    How often do you need to have someone help you when you read  instructions, pamphlets, or other written materials from your doctor or pharmacy? 1 - Never    What is the last grade level you completed in school? Master's degree      Pre-Education Assessment   Patient understands the diabetes disease and treatment process. Needs Instruction    Patient understands incorporating nutritional management into lifestyle. Needs Instruction    Patient undertands incorporating physical activity into lifestyle. Needs Instruction    Patient understands using medications safely. Needs Instruction    Patient understands monitoring blood glucose, interpreting and using results Needs Instruction    Patient understands prevention, detection, and treatment of acute complications. Needs Instruction    Patient understands prevention, detection, and treatment of chronic complications. Needs Instruction    Patient understands how to develop strategies to address psychosocial issues. Needs Instruction    Patient understands how to develop strategies to promote health/change behavior. Needs Instruction      Complications   Last HgB A1C per patient/outside source 6.6 %   11/24/20   How often do you check your blood sugar? 0 times/day (not testing)    Have you had a dilated eye exam in the past 12 months? No    Have you had a dental exam in the past 12 months? Yes    Are you checking your feet? No      Dietary Intake  Breakfast banana, protein bar    Snack (morning) none    Lunch Charcuterie pack (meat and cheese), chips and guac    Snack (afternoon) none    Dinner Charcuterie pack (meat and cheese), chips and guac    Snack (evening) suagr free popsicles    Beverage(s) water with [employer] Via, ~100 oz      Exercise   Exercise Type ADL's    How many days per week to you exercise? 2    How many minutes per day do you exercise? 30    Total minutes per week of exercise 60      Patient Education   Previous Diabetes Education No    Disease state  Definition of diabetes,  type 1 and 2, and the diagnosis of diabetes;Factors that contribute to the development of diabetes    Nutrition management  Role of diet in the treatment of diabetes and the relationship between the three main macronutrients and blood glucose level;Carbohydrate counting;Food label reading, portion sizes and measuring food.    Physical activity and exercise  Role of exercise on diabetes management, blood pressure control and cardiac health.    Chronic complications Retinopathy and reason for yearly dilated eye exams;Nephropathy, what it is, prevention of, the use of ACE, ARB's and early detection of through urine microalbumia.;Relationship between chronic complications and blood glucose control    Psychosocial adjustment Role of stress on diabetes;Identified and addressed patients feelings and concerns about diabetes    Personal strategies to promote health Review risk of smoking and offered smoking cessation;Lifestyle issues that need to be addressed for better diabetes care      Individualized Goals (developed by patient)   Nutrition Follow meal plan discussed;General guidelines for healthy choices and portions discussed    Physical Activity Exercise 3-5 times per week    Medications Not Applicable    Monitoring  Not Applicable      Post-Education Assessment   Patient understands the diabetes disease and treatment process. Needs Review    Patient understands incorporating nutritional management into lifestyle. Needs Review    Patient undertands incorporating physical activity into lifestyle. Needs Review    Patient understands using medications safely. Needs Review    Patient understands monitoring blood glucose, interpreting and using results Needs Review    Patient understands prevention, detection, and treatment of acute complications. Needs Review    Patient understands prevention, detection, and treatment of chronic complications. Needs Review    Patient understands how to develop  strategies to address psychosocial issues. Needs Review    Patient understands how to develop strategies to promote health/change behavior. Needs Review      Outcomes   Expected Outcomes Demonstrated interest in learning. Expect positive outcomes    Future DMSE 2 months    Program Status Not Completed           Individualized Plan for Diabetes Self-Management Training:   Learning Objective:  Patient will have a greater understanding of diabetes self-management. Patient education plan is to attend individual and/or group sessions per assessed needs and concerns.   Plan:   Patient Instructions  Check out Diabetes.org/recipes for good diabetes recipes.  Fire up that [employer] and look up the Diabetes Instant Pot recipe book.  Keep up the walks. Try to increase your walks to 3-4 times a week.  Work towards eating three meals a day, about 5-6 hours apart!  Begin to recognize carbohydrates in your food choices!  Have 3 carb choices at each meal (45  g).   Begin to build your meals using the proportions of the Balanced Plate. . First, select your carb choice(s) for the meal, and determine how much you should have to equal 3 carb choices (45 g). . Next, select your source of protein to pair with your carb choice(s). . Finally, complete the remaining half of your meal with a variety of non-starchy vegetables.    Expected Outcomes:  Demonstrated interest in learning. Expect positive outcomes  Education material provided: Meal plan card, My Plate and Snack sheet  If problems or questions, patient to contact team via:  Phone and Email  Future DSME appointment: 2 months

## 2021-02-13 ENCOUNTER — Encounter: Payer: Self-pay | Admitting: Emergency Medicine

## 2021-02-13 ENCOUNTER — Other Ambulatory Visit: Payer: Self-pay

## 2021-02-13 ENCOUNTER — Emergency Department
Admission: EM | Admit: 2021-02-13 | Discharge: 2021-02-13 | Disposition: A | Discharge status: Home / Self Care | Payer: BC Managed Care – PPO | Source: Home / Self Care | Attending: Family Medicine | Admitting: Family Medicine

## 2021-02-13 DIAGNOSIS — R059 Cough, unspecified: Secondary | ICD-10-CM

## 2021-02-13 DIAGNOSIS — J0100 Acute maxillary sinusitis, unspecified: Secondary | ICD-10-CM

## 2021-02-13 MED ORDER — AMOXICILLIN-POT CLAVULANATE 875-125 MG PO TABS: 1.0000 | ORAL_TABLET | Freq: Two times a day (BID) | ORAL | 0 refills | Status: DC

## 2021-02-13 MED ORDER — PREDNISONE 20 MG PO TABS: 20.0000 mg | ORAL_TABLET | Freq: Two times a day (BID) | ORAL | 0 refills | Status: DC

## 2021-02-13 NOTE — ED Provider Notes (Signed)
[name]    CSN: [id number] Arrival date & time: 02/13/21  1328      History   Chief Complaint Chief Complaint  Patient presents with  . Cough  . Nasal Congestion    HPI Candice Mcgrath is a 45 y.o. female.   HPI   Patient is a high Education officer, museum.  She has been sick for a week.  She states she usually has his allergies.  She states she has had a cough and congestion that is not getting better.  She has tried Mucinex, Sudafed, saline sprays.  She states that she has been feeling rundown and more tired.  No headache or body ache.  Unclear of any exposure to flu/COVID.  She is vaccinated.  Past Medical History:  Diagnosis Date  . Allergic rhinitis   . Herpes   . History of alcohol abuse    initially [facility], [facility]  . History of drug abuse in remission ([place])    adderrall, Xanax, MJ, others, no IV  . Hyperlipidemia   . IBS (irritable bowel syndrome)   . Migraine headache 12/24/2012  . Nephrolithiasis     Patient Active Problem List   Diagnosis Date Noted  . Mild persistent asthma, uncomplicated 40/98/1191  . Diabetes mellitus type 2, diet-controlled ([place]) 12/06/2020  . Generalized anxiety disorder 09/02/2014  . Major depressive disorder, recurrent episode, in partial remission ([place]) 05/18/2014  . Personal history of drug dependence ([place]) 12/24/2012  . Migraine headache 12/24/2012  . IBS 12/13/2009  . Hyperlipidemia 11/08/2009  . ADD 11/08/2009  . Allergic rhinitis 11/01/2008  . NEPHROLITHIASIS, HX OF 11/01/2008  . GENITAL HERPES 10/30/2008  . Personal history of alcoholism ([place]) 10/30/2008    Past Surgical History:  Procedure Laterality Date  . Basket Surgery  2005, 06   kidney stones  . TYMPANOSTOMY TUBE PLACEMENT Bilateral    as a child    OB History   No obstetric history on file.      Home Medications    Prior to Admission medications   Medication Sig Start Date End Date Taking? Authorizing Provider  albuterol  (PROAIR HFA) 108 (90 Base) MCG/ACT inhaler INHALE 2 PUFFS INTO THE LUNGS EVERY 4 (FOUR) HOURS AS NEEDED. USE SPARINGLY 03/06/16  Yes Copland, Frederico Hamman, MD  amoxicillin-clavulanate (AUGMENTIN) 875-125 MG tablet Take 1 tablet by mouth every 12 (twelve) hours. 02/13/21  Yes Raylene Everts, MD  atomoxetine (STRATTERA) 40 MG capsule TAKE 1 CAPSULE(40 MG) BY MOUTH TWICE DAILY 06/29/20  Yes Copland, Frederico Hamman, MD  azelastine (ASTELIN) 0.1 % nasal spray Place 1 spray into both nostrils 2 (two) times daily. Use in each nostril as directed   Yes [provider]  chlorproMAZINE (THORAZINE) 25 MG tablet  10/07/20  Yes [provider]  escitalopram (LEXAPRO) 20 MG tablet Take 1 tablet (20 mg total) by mouth daily. 12/06/20  Yes Copland, Frederico Hamman, MD  fluticasone (FLONASE) 50 MCG/ACT nasal spray Place 1 spray into both nostrils daily. 03/06/16  Yes Copland, Frederico Hamman, MD  levocetirizine (XYZAL) 5 MG tablet SMARTSIG:1 Tablet(s) By Mouth Every Evening 05/22/20  Yes [provider]  meloxicam (MOBIC) 15 MG tablet TAKE 1 TABLET(15 MG) BY MOUTH DAILY WITH FOOD AS NEEDED FOR PAIN 07/28/19  Yes Copland, Frederico Hamman, MD  montelukast (SINGULAIR) 10 MG tablet TK 1 T PO QD 02/25/19  Yes [provider]  PAZEO 0.7 % SOLN  04/06/17  Yes [provider]  predniSONE (DELTASONE) 20 MG tablet Take 1 tablet (20  mg total) by mouth 2 (two) times daily with a meal. 02/13/21  Yes Raylene Everts, MD  SUMAtriptan (IMITREX) 100 MG tablet Take by mouth. 11/25/20  Yes [provider]  SYMBICORT 160-4.5 MCG/ACT inhaler INL 2 PFS PO BID 02/25/19  Yes [provider]  tiZANidine (ZANAFLEX) 4 MG tablet TAKE 1 TABLET(4 MG) BY MOUTH AT BEDTIME 12/23/20  Yes Copland, Spencer, MD  EPINEPHrine 0.3 mg/0.3 mL IJ SOAJ injection USE UTD PRF SYSTEMATIC REACTION. 04/06/17   [provider]  hyoscyamine (LEVSIN SL) 0.125 MG SL tablet Place 1 tablet (0.125 mg total) under the tongue every 4 (four) hours as  needed. 08/10/20   Copland, Frederico Hamman, MD  lidocaine-prilocaine (EMLA) cream APPLY TOPICALLY 4 (FOUR) TIMES DAILY AS NEEDED. FOUR TIMES A DAY TO AFFECTED AREA, 1 MONTH SUPPLY 08/10/20   Copland, Frederico Hamman, MD  triamcinolone cream (KENALOG) 0.1 % Apply 1 application topically 2 (two) times daily. 11/23/19   Copland, Frederico Hamman, MD  valACYclovir (VALTREX) 500 MG tablet Take 1 tablet (500 mg total) by mouth 2 (two) times daily. For 3 days during a flare 09/27/17   Copland, Frederico Hamman, MD    Family History Family History  Problem Relation Age of Onset  . Stroke Maternal Grandmother   . Cancer Maternal Grandfather   . Heart disease Paternal Grandfather   . Alcohol abuse Other        GP  . Breast cancer Cousin   . Stroke Other        [place]    Social History Social History   Tobacco Use  . Smoking status: Former Smoker    Quit date: 06/24/2016    Years since quitting: 4.6  . Smokeless tobacco: Former Systems developer    Quit date: 12/04/2008  Substance Use Topics  . Alcohol use: Yes    Alcohol/week: 2.0 - 4.0 standard drinks    Types: 2 - 4 Cans of beer per week    Comment: recovering AA  . Drug use: No    Comment: Recovering NA     Allergies   Sulfamethoxazole-trimethoprim   Review of Systems Review of Systems See HPI  Physical Exam Triage Vital Signs ED Triage Vitals  Enc Vitals Group     BP 02/13/21 1343 114/84     Pulse Rate 02/13/21 1343 99     Resp 02/13/21 1343 15     Temp 02/13/21 1343 99.7 F (37.6 C)     Temp Source 02/13/21 1343 Oral     SpO2 02/13/21 1343 97 %     Weight --      Height --      Head Circumference --      Peak Flow --      Pain Score 02/13/21 1344 3     Pain Loc --      Pain Edu? --      Excl. in [place]? --    No data found.  Updated Vital Signs BP 114/84 (BP Location: Right Arm)   Pulse 99   Temp 99.7 F (37.6 C) (Oral)   Resp 15   LMP 02/06/2021 (Exact Date)   SpO2 97%      Physical Exam Constitutional:      General: She is not in acute distress.     Appearance: She is well-developed.     Comments: Overweight  HENT:     Head: Normocephalic and atraumatic.     Right Ear: Tympanic membrane, ear canal and external ear normal.  Left Ear: Tympanic membrane, ear canal and external ear normal.     Nose: Congestion and rhinorrhea present.     Mouth/Throat:     Pharynx: Posterior oropharyngeal erythema present.  Eyes:     Conjunctiva/sclera: Conjunctivae normal.     Pupils: Pupils are equal, round, and reactive to light.  Cardiovascular:     Rate and Rhythm: Normal rate and regular rhythm.     Heart sounds: Normal heart sounds.  Pulmonary:     Effort: Pulmonary effort is normal. No respiratory distress.     Breath sounds: Normal breath sounds.  Abdominal:     General: There is no distension.     Palpations: Abdomen is soft.  Musculoskeletal:        General: Normal range of motion.     Cervical back: Normal range of motion.  Lymphadenopathy:     Cervical: Cervical adenopathy present.  Skin:    General: Skin is warm and dry.  Neurological:     Mental Status: She is alert.  Psychiatric:        Behavior: Behavior normal.      UC Treatments / Results  Labs (all labs ordered are listed, but only abnormal results are displayed) Labs Reviewed  COVID-19, FLU A+B NAA    EKG   Radiology No results found.  Procedures Procedures (including critical care time)  Medications Ordered in UC Medications - No data to display  Initial Impression / Assessment and Plan / UC Course  I have reviewed the triage vital signs and the nursing notes.  Pertinent labs & imaging results that were available during my care of the patient were reviewed by me and considered in my medical decision making (see chart for details).     URI with cough.  Sinus infection.  We will treat with antibiotics and prednisone.  In addition to her over-the-counter medicines. See PCP if not improving Final Clinical Impressions(s) / UC Diagnoses   Final  diagnoses:  Cough  Acute non-recurrent maxillary sinusitis     Discharge Instructions     Continue to push fluids  take antibiotic 2 times a day for a week  take prednisone 2 times a day for 5 days  May continue your allergy medications  Return as needed Check MyChart for test results   ED Prescriptions    Medication Sig Dispense Auth. Provider   amoxicillin-clavulanate (AUGMENTIN) 875-125 MG tablet Take 1 tablet by mouth every 12 (twelve) hours. 14 tablet Raylene Everts, MD   predniSONE (DELTASONE) 20 MG tablet Take 1 tablet (20 mg total) by mouth 2 (two) times daily with a meal. 10 tablet Raylene Everts, MD     PDMP not reviewed this encounter.   Raylene Everts, MD 02/13/21 [phone]

## 2021-02-13 NOTE — ED Triage Notes (Signed)
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

## 2021-02-13 NOTE — Discharge Instructions (Addendum)
Continue to push fluids  take antibiotic 2 times a day for a week  take prednisone 2 times a day for 5 days  May continue your allergy medications  Return as needed Check MyChart for test results

## 2021-02-14 LAB — COVID-19, FLU A+B NAA
Influenza A, NAA: NOT DETECTED
Influenza B, NAA: NOT DETECTED
SARS-CoV-2, NAA: NOT DETECTED

## 2021-03-07 ENCOUNTER — Encounter: Payer: Self-pay | Admitting: Family Medicine

## 2021-03-07 ENCOUNTER — Ambulatory Visit: Payer: BC Managed Care – PPO | Admitting: Family Medicine

## 2021-03-07 ENCOUNTER — Other Ambulatory Visit: Payer: Self-pay

## 2021-03-07 VITALS — BP 102/86 | HR 95 | Temp 97.6°F | Ht 61.0 in | Wt 199.0 lb

## 2021-03-07 DIAGNOSIS — E119 Type 2 diabetes mellitus without complications: Secondary | ICD-10-CM

## 2021-03-07 DIAGNOSIS — F411 Generalized anxiety disorder: Secondary | ICD-10-CM

## 2021-03-07 LAB — POCT GLYCOSYLATED HEMOGLOBIN (HGB A1C): Hemoglobin A1C: 6.4 % — AB (ref 4.0–5.6)

## 2021-03-07 MED ORDER — MELOXICAM 15 MG PO TABS: ORAL_TABLET | ORAL | 1 refills | Status: DC

## 2021-03-07 NOTE — Progress Notes (Signed)
Kalik Hoare T. Shakiyah Cirilo, MD, [place] at [facility] [place], [address]  Phone: [phone]  FAX: [place] - 45 y.o. female  MRN [id number]  Date of Birth: 18-Oct-1976  Date: 03/07/2021  PCP: Owens Loffler, MD  Referral: Owens Loffler, MD  Chief Complaint  Patient presents with  . Diabetes  . Follow-up    This visit occurred during the SARS-CoV-2 public health emergency.  Safety protocols were in place, including screening questions prior to the visit, additional usage of staff PPE, and extensive cleaning of exam room while observing appropriate contact time as indicated for disinfecting solutions.   Subjective:   Candice Mcgrath is a 45 y.o. very pleasant female patient with Body mass index is 37.6 kg/m. who presents with the following:  Diabetes Mellitus: Tolerating Medications: yes Compliance with diet: fair, Body mass index is 37.6 kg/m. Exercise: minimal / intermittent Avg blood sugars at home: not checking Foot problems: none Hypoglycemia: none No nausea, vomitting, blurred vision, polyuria.  Lab Results  Component Value Date   HGBA1C 6.4 (A) 03/07/2021   HGBA1C 6.6 (H) 11/29/2020   HGBA1C 6.0 12/02/2019   Lab Results  Component Value Date   LDLCALC 96 11/29/2020   CREATININE 0.79 11/29/2020    Wt Readings from Last 3 Encounters:  03/07/21 199 lb (90.3 kg)  01/18/21 204 lb 8 oz (92.8 kg)  12/06/20 210 lb 8 oz (95.5 kg)    F/u anxiety:  On Lexapro 20 mg Feeling better with increased dose  Review of Systems is noted in the HPI, as appropriate  Objective:   BP 102/86   Pulse 95   Temp 97.6 F (36.4 C) (Temporal)   Ht 5\' 1"  (1.549 m)   Wt 199 lb (90.3 kg)   LMP 02/06/2021 (Exact Date)   SpO2 98%   BMI 37.60 kg/m   GEN: No acute distress; alert,appropriate. PULM: Breathing comfortably in no respiratory  distress PSYCH: Normally interactive.   Laboratory and Imaging Data: Results for orders placed or performed in visit on 03/07/21  POCT glycosylated hemoglobin (Hb A1C)  Result Value Ref Range   Hemoglobin A1C 6.4 (A) 4.0 - 5.6 %   HbA1c POC (<> result, manual entry)     HbA1c, POC (prediabetic range)     HbA1c, POC (controlled diabetic range)       Assessment and Plan:     ICD-10-CM   1. Diabetes mellitus type 2, diet-controlled (HCC)  E11.9 POCT glycosylated hemoglobin (Hb A1C)  2. 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 meloxicam (MOBIC) 15 MG tablet    Sig: TAKE 1 TABLET(15 MG) BY MOUTH DAILY WITH FOOD AS NEEDED FOR PAIN    Dispense:  30 tablet    Refill:  1   Medications Discontinued During This Encounter  Medication Reason  . amoxicillin-clavulanate (AUGMENTIN) 875-125 MG tablet Completed Course  . meloxicam (MOBIC) 15 MG tablet Reorder  . predniSONE (DELTASONE) 20 MG tablet    Orders Placed This Encounter  Procedures  . POCT glycosylated hemoglobin (Hb A1C)    Follow-up: Return in about 6 months (around 09/07/2021) for diabetes follow-up.  Signed,  Maud Deed. Drey Shaff, MD   Outpatient Encounter  Medications as of 03/07/2021  Medication Sig  . albuterol (PROAIR HFA) 108 (90 Base) MCG/ACT inhaler INHALE 2 PUFFS INTO THE LUNGS EVERY 4 (FOUR) HOURS AS NEEDED. USE SPARINGLY  . atomoxetine (STRATTERA) 40 MG capsule TAKE 1 CAPSULE(40 MG) BY MOUTH TWICE DAILY  . azelastine (ASTELIN) 0.1 % nasal spray Place 1 spray into both nostrils 2 (two) times daily. Use in each nostril as directed  . chlorproMAZINE (THORAZINE) 25 MG tablet   . EPINEPHrine 0.3 mg/0.3 mL IJ SOAJ injection USE UTD PRF SYSTEMATIC REACTION.  Marland Kitchen escitalopram (LEXAPRO) 20 MG tablet Take 1 tablet (20 mg total) by mouth daily.  .  fluticasone (FLONASE) 50 MCG/ACT nasal spray Place 1 spray into both nostrils daily.  . hyoscyamine (LEVSIN SL) 0.125 MG SL tablet Place 1 tablet (0.125 mg total) under the tongue every 4 (four) hours as needed.  Marland Kitchen levocetirizine (XYZAL) 5 MG tablet SMARTSIG:1 Tablet(s) By Mouth Every Evening  . lidocaine-prilocaine (EMLA) cream APPLY TOPICALLY 4 (FOUR) TIMES DAILY AS NEEDED. FOUR TIMES A DAY TO AFFECTED AREA, 1 MONTH SUPPLY  . montelukast (SINGULAIR) 10 MG tablet TK 1 T PO QD  . PAZEO 0.7 % SOLN   . SUMAtriptan (IMITREX) 100 MG tablet Take by mouth.  . SYMBICORT 160-4.5 MCG/ACT inhaler INL 2 PFS PO BID  . tiZANidine (ZANAFLEX) 4 MG tablet TAKE 1 TABLET(4 MG) BY MOUTH AT BEDTIME  . triamcinolone cream (KENALOG) 0.1 % Apply 1 application topically 2 (two) times daily.  . valACYclovir (VALTREX) 500 MG tablet Take 1 tablet (500 mg total) by mouth 2 (two) times daily. For 3 days during a flare  . [DISCONTINUED] meloxicam (MOBIC) 15 MG tablet TAKE 1 TABLET(15 MG) BY MOUTH DAILY WITH FOOD AS NEEDED FOR PAIN  . [DISCONTINUED] predniSONE (DELTASONE) 20 MG tablet Take 1 tablet (20 mg total) by mouth 2 (two) times daily with a meal.  . meloxicam (MOBIC) 15 MG tablet TAKE 1 TABLET(15 MG) BY MOUTH DAILY WITH FOOD AS NEEDED FOR PAIN  . [DISCONTINUED] amoxicillin-clavulanate (AUGMENTIN) 875-125 MG tablet Take 1 tablet by mouth every 12 (twelve) hours.   No facility-administered encounter medications on file as of 03/07/2021.

## 2021-03-29 ENCOUNTER — Ambulatory Visit: Payer: BC Managed Care – PPO | Admitting: Dietician

## 2021-05-07 ENCOUNTER — Telehealth: Payer: BC Managed Care – PPO | Admitting: Physician Assistant

## 2021-05-07 ENCOUNTER — Encounter: Payer: Self-pay | Admitting: Physician Assistant

## 2021-05-07 DIAGNOSIS — R21 Rash and other nonspecific skin eruption: Secondary | ICD-10-CM | POA: Diagnosis not present

## 2021-05-07 MED ORDER — TRIAMCINOLONE ACETONIDE 0.1 % EX CREA: 1.0000 "application " | TOPICAL_CREAM | Freq: Two times a day (BID) | CUTANEOUS | 0 refills | Status: AC

## 2021-05-07 NOTE — Progress Notes (Addendum)
Virtual Visit via Video Note  I connected with Candice Mcgrath on 05/11/21 at 10:30 AM EDT by a video enabled telemedicine application and verified that I am speaking with the correct person using two identifiers.  Location: Patient: pt's home Provider: provider's office Person participating in the virtual visit: pt and provider    I discussed the limitations of evaluation and management by telemedicine and the availability of in person appointments. The patient expressed understanding and agreed to proceed.     I discussed the assessment and treatment plan with the patient. The patient was provided an opportunity to ask questions and all were answered. The patient agreed with the plan and demonstrated an understanding of the instructions.   The patient was advised to call back or seek an in-person evaluation if the symptoms worsen or if the condition fails to improve as anticipated.  I provided 20 minutes of non-face-to-face time during this encounter.   Waldon Merl, PA-C   Acute Office Visit  Subjective:    Patient ID: Candice Mcgrath, female    DOB: 1975-11-04, 45 y.o.   MRN: [id number]  No chief complaint on file.   45 yo F in NAD connects via video visit with complaint of rash x 3 days. States rash is small bumps on the bilateral arms and neck, associated with itching. Rash onset after returning from trip, in which she was staying at a hotel. Unsure about any possible bed bugs/scabies at hotel. Has used OTC anti itch cream with some relief. Denies any URI sxs- denies any fever, chills, ha, body aches, sore throat. Denies any rash between the webs of finger/toes. Denies linear streaking/ vesicular/ulcer formation. Denies any contact with known allergen or any new medications or new body products, or eating food allergen. Denies any swelling of the face, tongue, throat, or any shortness of breath or compromise to her airway.   Denies any swelling, skin warmth,  fluctuance, streaking,  Patient is in today for rash  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[place])    adderrall, Xanax, MJ, others, no IV   Hyperlipidemia    IBS (irritable bowel syndrome)    Migraine headache 12/24/2012   Nephrolithiasis     Past Surgical History:  Procedure Laterality Date   Basket Surgery  2005, 06   kidney stones   TYMPANOSTOMY TUBE PLACEMENT Bilateral    as a child    Family History  Problem Relation Age of Onset   Stroke Maternal Grandmother    Cancer Maternal Grandfather    Heart disease Paternal Grandfather    Alcohol abuse Other        GP   Breast cancer Cousin    Stroke Other        GGM    Social History   Socioeconomic History   Marital status: Married    Spouse name: Not on file   Number of children: Not on file   Years of education: Not on file   Highest education level: Not on file  Occupational History   Occupation: spanish    Employer: [employer]    Comment: @ [employer]  Tobacco Use   Smoking status: Former    Types: Cigarettes    Quit date: 06/24/2016    Years since quitting: 4.8   Smokeless  tobacco: Former    Quit date: 12/04/2008  Substance and Sexual Activity   Alcohol use: Yes    Alcohol/week: 2.0 - 4.0 standard drinks    Types: 2 - 4 Cans of beer per week    Comment: recovering AA   Drug use: No    Comment: Recovering NA   Sexual activity: Not on file  Other Topics Concern   Not on file  Social History Narrative   Not on file   Social Determinants of Health   Financial Resource Strain: Not on file  Food Insecurity: Not on file  Transportation Needs: Not on file  Physical Activity: Not on file  Stress: Not on file  Social Connections: Not on file  Intimate Partner Violence: Not on file    Outpatient Medications Prior to Visit  Medication Sig  Dispense Refill   albuterol (PROAIR HFA) 108 (90 Base) MCG/ACT inhaler INHALE 2 PUFFS INTO THE LUNGS EVERY 4 (FOUR) HOURS AS NEEDED. USE SPARINGLY 8.5 each 4   atomoxetine (STRATTERA) 40 MG capsule TAKE 1 CAPSULE(40 MG) BY MOUTH TWICE DAILY 60 capsule 5   azelastine (ASTELIN) 0.1 % nasal spray Place 1 spray into both nostrils 2 (two) times daily. Use in each nostril as directed     chlorproMAZINE (THORAZINE) 25 MG tablet      EPINEPHrine 0.3 mg/0.3 mL IJ SOAJ injection USE UTD PRF SYSTEMATIC REACTION.  1   escitalopram (LEXAPRO) 20 MG tablet Take 1 tablet (20 mg total) by mouth daily. 90 tablet 3   fluticasone (FLONASE) 50 MCG/ACT nasal spray Place 1 spray into both nostrils daily. 16 g 5   hyoscyamine (LEVSIN SL) 0.125 MG SL tablet Place 1 tablet (0.125 mg total) under the tongue every 4 (four) hours as needed. 30 tablet 11   levocetirizine (XYZAL) 5 MG tablet SMARTSIG:1 Tablet(s) By Mouth Every Evening     lidocaine-prilocaine (EMLA) cream APPLY TOPICALLY 4 (FOUR) TIMES DAILY AS NEEDED. FOUR TIMES A DAY TO AFFECTED AREA, 1 MONTH SUPPLY 30 g 2   meloxicam (MOBIC) 15 MG tablet TAKE 1 TABLET(15 MG) BY MOUTH DAILY WITH FOOD AS NEEDED FOR PAIN 30 tablet 1   montelukast (SINGULAIR) 10 MG tablet TK 1 T PO QD     PAZEO 0.7 % SOLN   3   SUMAtriptan (IMITREX) 100 MG tablet Take by mouth.     SYMBICORT 160-4.5 MCG/ACT inhaler INL 2 PFS PO BID     tiZANidine (ZANAFLEX) 4 MG tablet TAKE 1 TABLET(4 MG) BY MOUTH AT BEDTIME 90 tablet 1   triamcinolone cream (KENALOG) 0.1 % Apply 1 application topically 2 (two) times daily. 454 g 0   valACYclovir (VALTREX) 500 MG tablet Take 1 tablet (500 mg total) by mouth 2 (two) times daily. For 3 days during a flare 30 tablet 5   No facility-administered medications prior to visit.    Allergies  Allergen Reactions   Sulfamethoxazole-Trimethoprim     REACTION: itching   Sulfa Antibiotics Rash    Review of Systems  Constitutional:  Negative for activity change,  appetite change, chills, diaphoresis, fatigue and fever.  HENT:  Negative for congestion, rhinorrhea, sinus pressure, sinus pain, sneezing, sore throat and trouble swallowing.   Respiratory:  Negative for cough, chest tightness, shortness of breath and wheezing.   Cardiovascular:  Negative for chest pain.  Gastrointestinal:  Negative for abdominal pain, nausea and vomiting.  Musculoskeletal:  Negative for myalgias.  Skin:  Positive for rash.  Neurological:  Negative for dizziness and  headaches.  Psychiatric/Behavioral:  Negative for agitation, behavioral problems and confusion.       Objective:    Physical Exam Constitutional:      General: She is not in acute distress.    Appearance: Normal appearance. She is not ill-appearing, toxic-appearing or diaphoretic.  HENT:     Head: Normocephalic and atraumatic.  Eyes:     General: No scleral icterus. Pulmonary:     Effort: Pulmonary effort is normal.  Skin:    Coloration: Skin is not pale.     Comments: On video- rash is small papular rash on the bilateral upper extremity and neck, associated with itching.   Neurological:     Mental Status: She is alert and oriented to person, place, and time.  Psychiatric:        Mood and Affect: Mood normal.        Behavior: Behavior normal.        Thought Content: Thought content normal.        Judgment: Judgment normal.    There were no vitals taken for this visit. Wt Readings from Last 3 Encounters:  03/07/21 199 lb (90.3 kg)  01/18/21 204 lb 8 oz (92.8 kg)  12/06/20 210 lb 8 oz (95.5 kg)    Health Maintenance Due  Topic Date Due   PNEUMOCOCCAL POLYSACCHARIDE VACCINE AGE 69-64 HIGH RISK  Never done   Pneumococcal Vaccine 11-61 Years old (1 - PCV) Never done   FOOT EXAM  Never done   OPHTHALMOLOGY EXAM  Never done   URINE MICROALBUMIN  Never done   COVID-19 Vaccine (5 - Booster for Pfizer series) 01/07/2021    There are no preventive care reminders to display for this  patient.   Lab Results  Component Value Date   TSH 2.08 11/29/2020   Lab Results  Component Value Date   WBC 8.3 11/29/2020   HGB 14.0 11/29/2020   HCT 41.8 11/29/2020   MCV 89.2 11/29/2020   PLT 288.0 11/29/2020   Lab Results  Component Value Date   NA 136 11/29/2020   K 4.2 11/29/2020   CO2 23 11/29/2020   GLUCOSE 112 (H) 11/29/2020   BUN 15 11/29/2020   CREATININE 0.79 11/29/2020   BILITOT 0.3 11/29/2020   ALKPHOS 75 11/29/2020   AST 15 11/29/2020   ALT 17 11/29/2020   PROT 6.7 11/29/2020   ALBUMIN 4.1 11/29/2020   CALCIUM 9.0 11/29/2020   GFR 91.78 11/29/2020   Lab Results  Component Value Date   CHOL 155 11/29/2020   Lab Results  Component Value Date   HDL 44.90 11/29/2020   Lab Results  Component Value Date   LDLCALC 96 11/29/2020   Lab Results  Component Value Date   TRIG 72.0 11/29/2020   Lab Results  Component Value Date   CHOLHDL 3 11/29/2020   Lab Results  Component Value Date   HGBA1C 6.4 (A) 03/07/2021       Assessment & Plan:   Problem List Items Addressed This Visit   None Visit Diagnoses     Rash    -  Primary        Meds ordered this encounter  Medications   triamcinolone cream (KENALOG) 0.1 %    Sig: Apply 1 application topically 2 (two) times daily.    Dispense:  30 g    Refill:  0    Order Specific Question:   Supervising Provider    Answer:   Noemi Chapel [[id number]]   Monkey  pox is low risk: no contact with anyone with disease, no immunocompromised disease, no URI predrome, no vesicle formation.  Recent travel hx make high risk of bed bug or possible other insect in hotel- will treat with steroids. Declines oral meds.  Waldon Merl, PA-C

## 2021-05-11 ENCOUNTER — Telehealth: Payer: Self-pay | Admitting: *Deleted

## 2021-05-11 MED ORDER — PREDNISONE 20 MG PO TABS: ORAL_TABLET | ORAL | 0 refills | Status: DC

## 2021-05-11 NOTE — Telephone Encounter (Signed)
See mychart notes.

## 2021-05-11 NOTE — Telephone Encounter (Signed)
Please see phone note  

## 2021-05-11 NOTE — Telephone Encounter (Signed)
Spoke with Candice Mcgrath.  She uses [facility] on [address].  I let her know Dr. Lorelei Pont was sending her in a Rx for prednisone.  She ask if she should still use the topical.  Per Dr. Lorelei Pont, okay to continue using the topical along with the prednisone.  Patient states understanding.

## 2021-05-11 NOTE — Telephone Encounter (Signed)
Patient called stating that she was in [place]. Patient stated that she has about 20 bites all over her that are itching real bad. Patient stated that she did a video visit 4 days ago and was prescribed a cream. Patient stated that the cream has dried the rash up some. Patient stated that the rash is still itching a lot. Patient stated that the rash is on her torso, under her bra and on her back. Patient stated there are some dark stops that looks like bruises on the spots. Patient stated she is not sure what is going on and wants Dr. Lillie Fragmin opinion. Patient stated that she is going to take some pictures of the rash and will be forwarding them to Dr. Lorelei Pont thru my chart.

## 2021-05-11 NOTE — Telephone Encounter (Signed)
Candice Mcgrath, can you help send in this script of prednisone for her?  Thanks!  I don't know her pharmacy

## 2021-05-23 ENCOUNTER — Telehealth: Payer: Self-pay | Admitting: *Deleted

## 2021-05-23 NOTE — Telephone Encounter (Signed)
Ok.  I did not have that information.  Please make her an appointment to see someone face to face to evaluate.  Unable to assess through video or email.

## 2021-05-23 NOTE — Telephone Encounter (Signed)
I think at this point, I would wait it out and use the topicals.  If it gets much worse, red, hot, etc. Then that is different.

## 2021-05-23 NOTE — Telephone Encounter (Signed)
Candice Mcgrath notified as instructed by telephone.  She states even with it spreading?  She states it is spreading on her chest and face.  Please advise.

## 2021-05-23 NOTE — Telephone Encounter (Signed)
Patient left a voicemail stating that she has finished 8 days of prednisone for a rash. Patient stated that she has some new places that have come up. Patient wants to know if she should do anything further or just wait it out. Pharmacy [facility]

## 2021-05-23 NOTE — Telephone Encounter (Signed)
Appointment scheduled with Dr. Lorelei Pont on 05/25/2021 at 11:20 am.

## 2021-05-25 ENCOUNTER — Ambulatory Visit: Payer: BC Managed Care – PPO | Admitting: Family Medicine

## 2021-05-25 ENCOUNTER — Other Ambulatory Visit: Payer: Self-pay

## 2021-05-25 ENCOUNTER — Encounter: Payer: Self-pay | Admitting: Family Medicine

## 2021-05-25 VITALS — BP 90/60 | HR 94 | Temp 98.6°F | Ht 61.0 in | Wt 201.5 lb

## 2021-05-25 DIAGNOSIS — R21 Rash and other nonspecific skin eruption: Secondary | ICD-10-CM | POA: Diagnosis not present

## 2021-05-25 NOTE — Progress Notes (Signed)
Candice Mcgrath T. Sheron Robin, MD, [place] [facility] Candice Mcgrath, [id number]  Phone: [phone]  FAX: [place] - 45 y.o. female  MRN [id number]  Date of Birth: April 08, 1976  Date: 05/25/2021  PCP: Owens Loffler, MD  Referral: Owens Loffler, MD  Chief Complaint  Patient presents with   Rash    This visit occurred during the SARS-CoV-2 public health emergency.  Safety protocols were in place, including screening questions prior to the visit, additional usage of staff PPE, and extensive cleaning of exam room while observing appropriate contact time as indicated for disinfecting solutions.   Subjective:   Candice Mcgrath is a 45 y.o. very pleasant female patient with Body mass index is 38.07 kg/m. who presents with the following:  Rash:  Step daughter went to rehab. Etoh or other stuff.   BAck will itch when it gets itchy. Itching some She has been using some topical steroids, and I gave her a round of some oral prednisone to take.  This has spread somewhat and its going a little bit on her face and neck. It is mildly itchy, but this was worse when she was outside and sweating.  She does think that generally it is improving.  New lotion from friend? Only possible exposure that she can think of.   Review of Systems is noted in the HPI, as appropriate  Objective:   BP 90/60   Pulse 94   Temp 98.6 F (37 C) (Temporal)   Ht '5\' 1"'$  (1.549 m)   Wt 201 lb 8 oz (91.4 kg)   SpO2 98%   BMI 38.07 kg/m   GEN: No acute distress; alert,appropriate. PULM: Breathing comfortably in no respiratory distress PSYCH: Normally interactive.        Laboratory and Imaging Data:  Assessment and Plan:     ICD-10-CM   1. Rash  R21      Unclear origin, but improving.  I would not do anything with this except watch it and continue with topical steroids.    Signed,  Maud Deed. Tylar Merendino,  MD   Outpatient Encounter Medications as of 05/25/2021  Medication Sig   albuterol (PROAIR HFA) 108 (90 Base) MCG/ACT inhaler INHALE 2 PUFFS INTO THE LUNGS EVERY 4 (FOUR) HOURS AS NEEDED. USE SPARINGLY   atomoxetine (STRATTERA) 40 MG capsule TAKE 1 CAPSULE(40 MG) BY MOUTH TWICE DAILY   azelastine (ASTELIN) 0.1 % nasal spray Place 1 spray into both nostrils 2 (two) times daily. Use in each nostril as directed   chlorproMAZINE (THORAZINE) 25 MG tablet    EPINEPHrine 0.3 mg/0.3 mL IJ SOAJ injection USE UTD PRF SYSTEMATIC REACTION.   escitalopram (LEXAPRO) 20 MG tablet Take 1 tablet (20 mg total) by mouth daily.   fluticasone (FLONASE) 50 MCG/ACT nasal spray Place 1 spray into both nostrils daily.   hyoscyamine (LEVSIN SL) 0.125 MG SL tablet Place 1 tablet (0.125 mg total) under the tongue every 4 (four) hours as needed.   levocetirizine (XYZAL) 5 MG tablet SMARTSIG:1 Tablet(s) By Mouth Every Evening   lidocaine-prilocaine (EMLA) cream APPLY TOPICALLY 4 (FOUR) TIMES DAILY AS NEEDED. FOUR TIMES A DAY TO AFFECTED AREA, 1 MONTH SUPPLY   meloxicam (MOBIC) 15 MG tablet TAKE 1 TABLET(15 MG) BY MOUTH DAILY WITH FOOD AS NEEDED FOR PAIN   montelukast (SINGULAIR) 10 MG tablet TK 1 T PO QD   PAZEO 0.7 % SOLN    SUMAtriptan (IMITREX) 100 MG  tablet Take by mouth.   SYMBICORT 160-4.5 MCG/ACT inhaler INL 2 PFS PO BID   tiZANidine (ZANAFLEX) 4 MG tablet TAKE 1 TABLET(4 MG) BY MOUTH AT BEDTIME   triamcinolone cream (KENALOG) 0.1 % Apply 1 application topically 2 (two) times daily.   valACYclovir (VALTREX) 500 MG tablet Take 1 tablet (500 mg total) by mouth 2 (two) times daily. For 3 days during a flare   [DISCONTINUED] predniSONE (DELTASONE) 20 MG tablet 2 tabs po for 4 days, then 1 tab po for 4 days   [DISCONTINUED] triamcinolone cream (KENALOG) 0.1 % Apply 1 application topically 2 (two) times daily.   No facility-administered encounter medications on file as of 05/25/2021.

## 2021-06-10 ENCOUNTER — Other Ambulatory Visit: Payer: Self-pay | Admitting: Family Medicine

## 2021-06-10 NOTE — Telephone Encounter (Signed)
Last office visit 05/25/2021 for rash.  Last refilled 12/23/2020 for #90 with 1 refill.  No future appointments.

## 2021-07-27 ENCOUNTER — Ambulatory Visit: Payer: BC Managed Care – PPO | Admitting: Primary Care

## 2021-08-04 ENCOUNTER — Other Ambulatory Visit: Payer: Self-pay | Admitting: Family Medicine

## 2021-08-04 NOTE — Telephone Encounter (Signed)
  Encourage patient to contact the pharmacy for refills or they can request refills through [place]:  03/07/21  NEXT APPOINTMENT DATE: none  MEDICATION: Strattera  Is the patient out of medication? Yes  [facility]  Let patient know to contact pharmacy at the end of the day to make sure medication is ready.  Please notify patient to allow 48-72 hours to process  CLINICAL FILLS OUT ALL BELOW:   LAST REFILL:  QTY:  REFILL DATE:    OTHER COMMENTS:    Okay for refill?  Please advise

## 2021-08-05 NOTE — Telephone Encounter (Signed)
Name of Medication: Straterra 40 mg Name of Pharmacy: [facility] or Written Date and Quantity: # 60 x 5 on 06/29/2020 Last Office Visit and Type: 03/07/21 DM FU & 05/25/21 acute for rash Next Office Visit and Type: none scheduled

## 2021-08-05 NOTE — Addendum Note (Signed)
Addended by: Helene Shoe on: 08/05/2021 10:49 AM   Modules accepted: Orders

## 2021-08-06 MED ORDER — ATOMOXETINE HCL 40 MG PO CAPS: ORAL_CAPSULE | ORAL | 5 refills | Status: DC

## 2021-08-29 ENCOUNTER — Encounter: Payer: Self-pay | Admitting: Family Medicine

## 2021-08-29 ENCOUNTER — Telehealth (INDEPENDENT_AMBULATORY_CARE_PROVIDER_SITE_OTHER): Payer: BC Managed Care – PPO | Admitting: Family Medicine

## 2021-08-29 ENCOUNTER — Other Ambulatory Visit: Payer: Self-pay

## 2021-08-29 VITALS — Temp 98.1°F | Ht 61.0 in | Wt 198.0 lb

## 2021-08-29 DIAGNOSIS — F411 Generalized anxiety disorder: Secondary | ICD-10-CM | POA: Diagnosis not present

## 2021-08-29 DIAGNOSIS — F3341 Major depressive disorder, recurrent, in partial remission: Secondary | ICD-10-CM | POA: Diagnosis not present

## 2021-08-29 NOTE — Progress Notes (Signed)
Olivier Frayre T. Eulan Heyward, MD Primary Care and [place] at [facility] [place], [address] Phone: [phone]  FAX: [place] - 45 y.o. female  MRN [id number]  Date of Birth: [birth year]  Visit Date: 08/29/2021  PCP: Owens Loffler, MD  Referred by: Owens Loffler, MD  Virtual Visit via Video Note:  I connected with  Candice Mcgrath on 08/29/2021  8:40 AM EST by a video enabled telemedicine application and verified that I am speaking with the correct person using two identifiers.   Location patient: home computer, tablet, or smartphone Location provider: work or home office Consent: Verbal consent directly obtained from [facility]. Persons participating in the virtual visit: patient, provider  I discussed the limitations of evaluation and management by telemedicine and the availability of in person appointments. The patient expressed understanding and agreed to proceed.  Chief Complaint  Patient presents with   Depression    History of Present Illness:  Follow-up worsened depression and anxiety.  1 for a week.  She has been feeling notably depressed. Depression and feeling lo.   Not drinking at all. She does feel better today and over the last few days.  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 Screaming a bit one at a child, too. Cussed at them.  This was only at 1 student.  Energy low Sleeping a lot.  Naps.  8 hours sometimes. No guilty.  LMP, 08/15/2021.  Not exercising.  [employer] Classroom is really.   Hold on med changes for now with 10 day email f/u  Her ADD is under fair control only.  Review of Systems as above: See pertinent positives and pertinent negatives per HPI No acute distress verbally   Observations/Objective/Exam:  An attempt was made to discern vital signs over the phone and per patient  if applicable and possible.   General:    Alert, Oriented, appears well and in no acute distress  Pulmonary:     On inspection no signs of respiratory distress.  Psych / Neurological:     Pleasant and cooperative.  Assessment and Plan:    ICD-10-CM   1. Major depressive disorder, recurrent episode, in partial remission ([place])  F33.41     2. Generalized anxiety disorder  F41.1      Total encounter time: 20 minutes. This includes total time spent on the day of encounter.    Approximate 1 week history of depression with exacerbation.  She is feeling somewhat better today.  While she does have some signs of worsening depression, think that is probably reasonable to give this a little bit more time to see if she evens out without making any medication changes.  Unfortunately, Wellbutrin does have some significant, possible life-threatening interactions with Thorazine.  She is going to send me a MyChart message in 10 days regardless of if she is doing better or worse.  At that time, if she is doing worse, I think that adding an additional medication would be reasonable and appropriate  I discussed the assessment and treatment plan with the patient. The patient was provided an opportunity to ask questions and all were answered. The patient agreed with the plan and demonstrated an understanding of the instructions.   The patient was advised to call back or seek an in-person evaluation if the symptoms worsen or if the condition fails to improve as anticipated.  Follow-up: prn unless noted otherwise below No follow-ups on file.  No orders of the defined types were placed in this encounter.  No orders of the defined types were placed in this encounter.   Signed,  Maud Deed. Beila Purdie, MD

## 2021-08-30 ENCOUNTER — Other Ambulatory Visit: Payer: Self-pay | Admitting: Family Medicine

## 2021-08-30 NOTE — Telephone Encounter (Signed)
Last office visit 08/29/2021 for depression.  Last refilled 08/06/2021 for #60 with 5 refills.  Pharmacy is requesting 90 day supply.

## 2021-09-12 ENCOUNTER — Emergency Department (HOSPITAL_BASED_OUTPATIENT_CLINIC_OR_DEPARTMENT_OTHER)
Admission: EM | Admit: 2021-09-12 | Discharge: 2021-09-12 | Disposition: A | Discharge status: Home / Self Care | Payer: BC Managed Care – PPO | Attending: Emergency Medicine | Admitting: Emergency Medicine

## 2021-09-12 ENCOUNTER — Encounter (HOSPITAL_BASED_OUTPATIENT_CLINIC_OR_DEPARTMENT_OTHER): Payer: Self-pay | Admitting: Emergency Medicine

## 2021-09-12 ENCOUNTER — Telehealth: Payer: BC Managed Care – PPO | Admitting: Physician Assistant

## 2021-09-12 ENCOUNTER — Ambulatory Visit: Payer: BC Managed Care – PPO

## 2021-09-12 ENCOUNTER — Emergency Department (HOSPITAL_BASED_OUTPATIENT_CLINIC_OR_DEPARTMENT_OTHER): Payer: BC Managed Care – PPO

## 2021-09-12 ENCOUNTER — Other Ambulatory Visit: Payer: Self-pay

## 2021-09-12 DIAGNOSIS — Z87891 Personal history of nicotine dependence: Secondary | ICD-10-CM | POA: Diagnosis not present

## 2021-09-12 DIAGNOSIS — J45909 Unspecified asthma, uncomplicated: Secondary | ICD-10-CM | POA: Insufficient documentation

## 2021-09-12 DIAGNOSIS — M545 Low back pain, unspecified: Secondary | ICD-10-CM | POA: Insufficient documentation

## 2021-09-12 DIAGNOSIS — R109 Unspecified abdominal pain: Secondary | ICD-10-CM | POA: Diagnosis not present

## 2021-09-12 DIAGNOSIS — E119 Type 2 diabetes mellitus without complications: Secondary | ICD-10-CM | POA: Diagnosis not present

## 2021-09-12 DIAGNOSIS — R1031 Right lower quadrant pain: Secondary | ICD-10-CM

## 2021-09-12 DIAGNOSIS — Z87442 Personal history of urinary calculi: Secondary | ICD-10-CM

## 2021-09-12 LAB — COMPREHENSIVE METABOLIC PANEL
ALT: 12 U/L (ref 0–44)
AST: 12 U/L — ABNORMAL LOW (ref 15–41)
Albumin: 3.9 g/dL (ref 3.5–5.0)
Alkaline Phosphatase: 72 U/L (ref 38–126)
Anion gap: 6 (ref 5–15)
BUN: 11 mg/dL (ref 6–20)
CO2: 26 mmol/L (ref 22–32)
Calcium: 8.9 mg/dL (ref 8.9–10.3)
Chloride: 106 mmol/L (ref 98–111)
Creatinine, Ser: 0.75 mg/dL (ref 0.44–1.00)
GFR, Estimated: >60 mL/min (ref >60)
Glucose, Bld: 137 mg/dL — ABNORMAL HIGH (ref 70–99)
Potassium: 4 mmol/L (ref 3.5–5.1)
Sodium: 138 mmol/L (ref 135–145)
Total Bilirubin: 0.3 mg/dL (ref 0.3–1.2)
Total Protein: 6.6 g/dL (ref 6.5–8.1)

## 2021-09-12 LAB — CBC WITH DIFFERENTIAL/PLATELET
Abs Immature Granulocytes: 0.07 10*3/uL (ref 0.00–0.07)
Basophils Absolute: 0.1 10*3/uL (ref 0.0–0.1)
Basophils Relative: 1 %
Eosinophils Absolute: 0.2 10*3/uL (ref 0.0–0.5)
Eosinophils Relative: 2 %
HCT: 42 % (ref 36.0–46.0)
Hemoglobin: 13.7 g/dL (ref 12.0–15.0)
Immature Granulocytes: 1 %
Lymphocytes Relative: 19 %
Lymphs Abs: 1.8 10*3/uL (ref 0.7–4.0)
MCH: 30.2 pg (ref 26.0–34.0)
MCHC: 32.6 g/dL (ref 30.0–36.0)
MCV: 92.7 fL (ref 80.0–100.0)
Monocytes Absolute: 0.9 10*3/uL (ref 0.1–1.0)
Monocytes Relative: 9 %
Neutro Abs: 6.6 10*3/uL (ref 1.7–7.7)
Neutrophils Relative %: 68 %
Platelets: 254 10*3/uL (ref 150–400)
RBC: 4.53 MIL/uL (ref 3.87–5.11)
RDW: 13.7 % (ref 11.5–15.5)
WBC: 9.6 10*3/uL (ref 4.0–10.5)
nRBC: 0 % (ref 0.0–0.2)

## 2021-09-12 LAB — URINALYSIS, ROUTINE W REFLEX MICROSCOPIC
Bilirubin Urine: NEGATIVE
Glucose, UA: NEGATIVE mg/dL
Leukocytes,Ua: NEGATIVE
Nitrite: NEGATIVE
Protein, ur: 30 mg/dL — AB
RBC / HPF: >50 RBC/hpf — ABNORMAL HIGH (ref 0–5)
Specific Gravity, Urine: 1.027 (ref 1.005–1.030)
pH: 6 (ref 5.0–8.0)

## 2021-09-12 LAB — LIPASE, BLOOD: Lipase: 76 U/L — ABNORMAL HIGH (ref 11–51)

## 2021-09-12 LAB — PREGNANCY, URINE: Preg Test, Ur: NEGATIVE

## 2021-09-12 MED ORDER — KETOROLAC TROMETHAMINE 15 MG/ML IJ SOLN
15.0000 mg | Freq: Once | INTRAMUSCULAR | Status: AC
  Administered 2021-09-12: 15 mg via INTRAVENOUS
  Filled 2021-09-12: qty 1

## 2021-09-12 MED ORDER — ACETAMINOPHEN 500 MG PO TABS
1000.0000 mg | ORAL_TABLET | Freq: Once | ORAL | Status: AC
  Administered 2021-09-12: 1000 mg via ORAL
  Filled 2021-09-12: qty 2

## 2021-09-12 MED ORDER — LACTATED RINGERS IV BOLUS
1000.0000 mL | Freq: Once | INTRAVENOUS | Status: AC
  Administered 2021-09-12: 1000 mL via INTRAVENOUS

## 2021-09-12 MED ORDER — IOHEXOL 300 MG/ML  SOLN
85.0000 mL | Freq: Once | INTRAMUSCULAR | Status: AC | PRN
  Administered 2021-09-12: 85 mL via INTRAVENOUS

## 2021-09-12 NOTE — ED Provider Notes (Signed)
[facility] EMERGENCY DEPT Provider Note   CSN: [id number] Arrival date & time: 09/12/21  1308     History Chief Complaint  Patient presents with   Back Pain    Candice Mcgrath is a 45 y.o. female.   Back Pain Associated symptoms: no abdominal pain, no chest pain, no dysuria, no fever, no numbness, no pelvic pain and no weakness   Patient presents for right-sided lower back pain.  She denies any recent traumas.  Initially, pain was in the right flank.  Onset was approximately 1 week ago. She went to the chiropractor without relief.  Pain has been worsened with standing, bending, and twisting.  She denies any urinary symptoms.  She is currently on her menstrual cycle, which started yesterday.  She has not had fevers or chills.  She denies any vaginal discharge.  She does have remote history of kidney stones.  She feels that her symptoms are similar to previous episodes of nephrolithiasis.    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[place])    adderrall, Xanax, MJ, others, no IV   Hyperlipidemia    IBS (irritable bowel syndrome)    Migraine headache 12/24/2012   Nephrolithiasis     Patient Active Problem List   Diagnosis Date Noted   Mild persistent asthma, uncomplicated 65/78/4696   Diabetes mellitus type 2, diet-controlled ([place]) 12/06/2020   Generalized anxiety disorder 09/02/2014   Major depressive disorder, recurrent episode, in partial remission ([place]) 05/18/2014   Personal history of drug dependence ([place]) 12/24/2012   Migraine headache 12/24/2012   IBS 12/13/2009   Hyperlipidemia 11/08/2009   ADD 11/08/2009   Allergic rhinitis 11/01/2008   NEPHROLITHIASIS, HX OF 11/01/2008   GENITAL HERPES 10/30/2008   Personal history of alcoholism ([place]) 10/30/2008    Past Surgical History:  Procedure Laterality Date   Basket  Surgery  2005, 06   kidney stones   TYMPANOSTOMY TUBE PLACEMENT Bilateral    as a child     OB History   No obstetric history on file.     Family History  Problem Relation Age of Onset   Stroke Maternal Grandmother    Cancer Maternal Grandfather    Heart disease Paternal Grandfather    Alcohol abuse Other        GP   Breast cancer Cousin    Stroke Other        [place]    Social History   Tobacco Use   Smoking status: Former    Types: Cigarettes    Quit date: 06/24/2016    Years since quitting: 5.2   Smokeless tobacco: Former    Quit date: 12/04/2008  Substance Use Topics   Alcohol use: Yes    Alcohol/week: 2.0 - 4.0 standard drinks    Types: 2 - 4 Cans of beer per week    Comment: recovering AA   Drug use: No    Comment: Recovering NA    Home Medications Prior to Admission medications   Medication Sig Start Date End Date Taking? Authorizing Provider  albuterol (PROAIR HFA) 108 (90 Base) MCG/ACT inhaler INHALE 2 PUFFS INTO THE LUNGS EVERY 4 (FOUR) HOURS AS NEEDED. USE SPARINGLY 03/06/16   Copland, [place] Hamman, MD  atomoxetine (STRATTERA) 40 MG capsule TAKE 1 CAPSULE(40 MG) BY MOUTH TWICE DAILY 08/31/21   Copland, Frederico Hamman,  MD  azelastine (ASTELIN) 0.1 % nasal spray Place 1 spray into both nostrils 2 (two) times daily. Use in each nostril as directed    [provider]  chlorproMAZINE (THORAZINE) 25 MG tablet Take 25 mg by mouth daily as needed. 10/07/20   [provider]  EPINEPHrine 0.3 mg/0.3 mL IJ SOAJ injection USE UTD PRF SYSTEMATIC REACTION. 04/06/17   [provider]  escitalopram (LEXAPRO) 20 MG tablet Take 1 tablet (20 mg total) by mouth daily. 12/06/20   Copland, [place]ico Hamman, MD  fluticasone (FLONASE) 50 MCG/ACT nasal spray Place 1 spray into both nostrils daily. 03/06/16   Copland, Frederico Hamman, MD  hyoscyamine (LEVSIN SL) 0.125 MG SL tablet Place 1 tablet (0.125 mg total) under the tongue every 4 (four) hours as needed. 08/10/20   Copland, Frederico Hamman, MD   levocetirizine (XYZAL) 5 MG tablet SMARTSIG:1 Tablet(s) By Mouth Every Evening 05/22/20   [provider]  lidocaine-prilocaine (EMLA) cream APPLY TOPICALLY 4 (FOUR) TIMES DAILY AS NEEDED. FOUR TIMES A DAY TO AFFECTED AREA, 1 MONTH SUPPLY 08/10/20   Copland, Frederico Hamman, MD  meloxicam (MOBIC) 15 MG tablet TAKE 1 TABLET(15 MG) BY MOUTH DAILY WITH FOOD AS NEEDED FOR PAIN 03/07/21   Copland, Frederico Hamman, MD  PAZEO 0.7 % SOLN  04/06/17   [provider]  SUMAtriptan (IMITREX) 100 MG tablet Take by mouth. 11/25/20   [provider]  tiZANidine (ZANAFLEX) 4 MG tablet TAKE 1 TABLET(4 MG) BY MOUTH AT BEDTIME 06/10/21   Copland, [place]ico Hamman, MD  triamcinolone cream (KENALOG) 0.1 % Apply 1 application topically 2 (two) times daily. 05/07/21   Waldon Merl, PA-C  valACYclovir (VALTREX) 500 MG tablet Take 1 tablet (500 mg total) by mouth 2 (two) times daily. For 3 days during a flare 09/27/17   Copland, [place]ico Hamman, MD    Allergies    Sulfamethoxazole-trimethoprim and Sulfa antibiotics  Review of Systems   Review of Systems  Constitutional:  Negative for activity change, appetite change, chills, fatigue and fever.  HENT:  Negative for congestion, ear pain and sore throat.   Eyes:  Negative for pain and visual disturbance.  Respiratory:  Negative for cough and shortness of breath.   Cardiovascular:  Negative for chest pain and palpitations.  Gastrointestinal:  Negative for abdominal distention, abdominal pain, blood in stool, diarrhea and vomiting.  Genitourinary:  Positive for flank pain. Negative for dysuria, hematuria, pelvic pain and vaginal discharge.  Musculoskeletal:  Positive for back pain. Negative for arthralgias, gait problem, joint swelling and neck pain.  Skin:  Negative for color change and rash.  Neurological:  Negative for dizziness, seizures, syncope, weakness, light-headedness and numbness.  Psychiatric/Behavioral:  Negative for decreased concentration.   All other systems  reviewed and are negative.  Physical Exam Updated Vital Signs BP 120/80   Pulse 64   Temp 97.8 F (36.6 C)   Resp 16   Ht 5\' 1"  (1.549 m)   Wt 89.8 kg   LMP 08/14/2021   SpO2 100%   BMI 37.41 kg/m   Physical Exam Vitals and nursing note reviewed.  Constitutional:      General: She is not in acute distress.    Appearance: Normal appearance. She is well-developed and normal weight. She is not ill-appearing, toxic-appearing or diaphoretic.  HENT:     Head: Normocephalic and atraumatic.     Right Ear: External ear normal.     Left Ear: External ear normal.     Nose: Nose normal.     Mouth/Throat:  Mouth: Mucous membranes are moist.     Pharynx: Oropharynx is clear.  Eyes:     General: No scleral icterus.    Conjunctiva/sclera: Conjunctivae normal.  Cardiovascular:     Rate and Rhythm: Normal rate and regular rhythm.     Heart sounds: No murmur heard. Pulmonary:     Effort: Pulmonary effort is normal. No respiratory distress.     Breath sounds: Normal breath sounds. No wheezing or rales.  Abdominal:     Palpations: Abdomen is soft.     Tenderness: There is no abdominal tenderness. There is no right CVA tenderness or left CVA tenderness.  Musculoskeletal:        General: No swelling or tenderness.     Cervical back: Normal range of motion and neck supple.     Right lower leg: No edema.     Left lower leg: No edema.  Skin:    General: Skin is warm and dry.     Capillary Refill: Capillary refill takes less than 2 seconds.     Coloration: Skin is not jaundiced or pale.  Neurological:     General: No focal deficit present.     Mental Status: She is alert and oriented to person, place, and time.     Cranial Nerves: No cranial nerve deficit.     Sensory: No sensory deficit.     Motor: No weakness.  Psychiatric:        Mood and Affect: Mood normal.        Behavior: Behavior normal.        Thought Content: Thought content normal.        Judgment: Judgment normal.     ED Results / Procedures / Treatments   Labs (all labs ordered are listed, but only abnormal results are displayed) Labs Reviewed  COMPREHENSIVE METABOLIC PANEL - Abnormal; Notable for the following components:      Result Value   Glucose, Bld 137 (*)    AST 12 (*)    All other components within normal limits  LIPASE, BLOOD - Abnormal; Notable for the following components:   Lipase 76 (*)    All other components within normal limits  URINALYSIS, ROUTINE W REFLEX MICROSCOPIC - Abnormal; Notable for the following components:   APPearance HAZY (*)    Hgb urine dipstick LARGE (*)    Ketones, ur TRACE (*)    Protein, ur 30 (*)    RBC / HPF >50 (*)    Bacteria, UA RARE (*)    All other components within normal limits  CBC WITH DIFFERENTIAL/PLATELET  PREGNANCY, URINE    EKG None  Radiology CT ABDOMEN PELVIS W CONTRAST  Result Date: 09/12/2021 CLINICAL DATA:  Right lower quadrant abdominal pain EXAM: CT ABDOMEN AND PELVIS WITH CONTRAST TECHNIQUE: Multidetector CT imaging of the abdomen and pelvis was performed using the standard protocol following bolus administration of intravenous contrast. CONTRAST:  34mL OMNIPAQUE IOHEXOL 300 MG/ML  SOLN COMPARISON:  07/15/2014 FINDINGS: Lower chest: Or no acute abnormality. Hepatobiliary: No focal hepatic abnormality. Gallbladder unremarkable. Pancreas: No focal abnormality or ductal dilatation. Spleen: No focal abnormality.  Normal size. Adrenals/Urinary Tract: Multiple bilateral nonobstructing renal stones. No ureteral stones or hydronephrosis. Adrenal glands and urinary bladder unremarkable. Stomach/Bowel: Normal appendix. Stomach, large and small bowel grossly unremarkable. Vascular/Lymphatic: No evidence of aneurysm or adenopathy. Reproductive: Uterus and adnexa unremarkable.  No mass. Other: No free fluid or free air. Musculoskeletal: No acute bony abnormality. IMPRESSION: Bilateral nephrolithiasis.  No ureteral stones  or hydronephrosis. Normal  appendix. No acute findings. Electronically Signed   By: Rolm Baptise M.D.   On: 09/12/2021 11:22    Procedures Procedures   Medications Ordered in ED Medications  acetaminophen (TYLENOL) tablet 1,000 mg (1,000 mg Oral Given 09/12/21 1013)  lactated ringers bolus 1,000 mL (0 mLs Intravenous Stopped 09/12/21 1223)  iohexol (OMNIPAQUE) 300 MG/ML solution 85 mL (85 mLs Intravenous Contrast Given 09/12/21 1059)  ketorolac (TORADOL) 15 MG/ML injection 15 mg (15 mg Intravenous Given 09/12/21 1159)    ED Course  I have reviewed the triage vital signs and the nursing notes.  Pertinent labs & imaging results that were available during my care of the patient were reviewed by me and considered in my medical decision making (see chart for details).    MDM Rules/Calculators/A&P                          Patient presenting for 1 week of pain initiated in her right flank and subsequently migrated to her right lower back.  Patient afebrile with normal heart rate and blood pressure on arrival.  She appears comfortable on exam.  She has no areas of tenderness.  She does have history of kidney stones.  Work-up initiated to assess for possible urinary infection and/or nephrolithiasis.  Patient was given Tylenol for analgesia.  IV fluids ordered as well.  Work-up shows no leukocytosis, no evidence of acute urinary infection, and hematuria (likely secondary to menses).  Patient's lipase is slightly above normal, but symptoms not consistent with pancreatitis.  CT scan showed bilateral nephrolithiasis without evidence of obstructive uropathy.  Patient symptoms could be secondary to recently passed stone, musculoskeletal etiology, and/or pain related to menses.  Patient was advised to continue symptomatic relief with NSAIDs and to return for any worsening symptoms.  Toradol given.  Patient was discharged in good condition.  Final Clinical Impression(s) / ED Diagnoses Final diagnoses:  Acute right-sided low back pain  without sciatica  Right flank pain    Rx / DC Orders ED Discharge Orders     None        Godfrey Pick, MD 09/13/21 [phone]

## 2021-09-12 NOTE — ED Triage Notes (Signed)
Pt arrives to ed with c/o of right sided mid back pain x6 weeks. The pain is intermittent and described as sharp. The pain has worsened. The pain does not radiate. No incontinence, numbness, tingling. No dysuria, urinary frequency. LMP x1 day ago.

## 2021-09-12 NOTE — Progress Notes (Signed)
Virtual Visit Consent   Candice Mcgrath [facility], you are scheduled for a virtual visit with a [place] provider today.     Just as with appointments in the office, your consent must be obtained to participate.  Your consent will be active for this visit and any virtual visit you may have with one of our providers in the next 365 days.     If you have a MyChart account, a copy of this consent can be sent to you electronically.  All virtual visits are billed to your insurance company just like a traditional visit in the office.    As this is a virtual visit, video technology does not allow for your provider to perform a traditional examination.  This may limit your provider's ability to fully assess your condition.  If your provider identifies any concerns that need to be evaluated in person or the need to arrange testing (such as labs, EKG, etc.), we will make arrangements to do so.     Although advances in technology are sophisticated, we cannot ensure that it will always work on either your end or our end.  If the connection with a video visit is poor, the visit may have to be switched to a telephone visit.  With either a video or telephone visit, we are not always able to ensure that we have a secure connection.     I need to obtain your verbal consent now.   Are you willing to proceed with your visit today?    Candice Mcgrath has provided verbal consent on 09/12/2021 for a virtual visit (video or telephone).   Mar Daring, PA-C   Date: 09/12/2021 8:15 AM   Virtual Visit via Video Note   I, Mar Daring, connected with  Candice Mcgrath  ([id number], November 05, 1975) on 09/12/21 at  8:00 AM EST by a video-enabled telemedicine application and verified that I am speaking with the correct person using two identifiers.  Location: Patient: Virtual Visit Location Patient: Home Provider: Virtual Visit Location Provider: Home Office   I discussed the limitations of  evaluation and management by telemedicine and the availability of in person appointments. The patient expressed understanding and agreed to proceed.    History of Present Illness: Candice Mcgrath is a 45 y.o. who identifies as a female who was assigned female at birth, and is being seen today for back pain.  HPI: Back Pain This is a new problem. The current episode started in the past 7 days (last wednesday back pain started; went to chiropractor and pain has worsened). The problem occurs constantly. The problem has been gradually worsening since onset. The pain is present in the lumbar spine. The quality of the pain is described as aching, cramping, stabbing and shooting. Radiates to: radiates to lower abdominal/pelvic region on right. The pain is severe. The pain is The same all the time. The symptoms are aggravated by bending, position and standing (when she stands the pain doubles her over). Stiffness is present All day. Associated symptoms include abdominal pain, pelvic pain and weakness. Pertinent negatives include no bladder incontinence, bowel incontinence, dysuria or fever. Risk factors: history of kidney stones. She has tried bed rest, chiropractic manipulation and NSAIDs for the symptoms. The treatment provided no relief.   Pain started in right lower quadrant of abdomen last Tuesday evening and progressed to low back by Wednesday.  Menstrual cycle started so she is unable to tell if she has any true hematuria.  Problems:  Patient Active Problem List   Diagnosis Date Noted   Mild persistent asthma, uncomplicated 42/35/3614   Diabetes mellitus type 2, diet-controlled ([place]) 12/06/2020   Generalized anxiety disorder 09/02/2014   Major depressive disorder, recurrent episode, in partial remission ([place]) 05/18/2014   Personal history of drug dependence ([place]) 12/24/2012   Migraine headache 12/24/2012   IBS 12/13/2009   Hyperlipidemia 11/08/2009   ADD 11/08/2009   Allergic rhinitis  11/01/2008   NEPHROLITHIASIS, HX OF 11/01/2008   GENITAL HERPES 10/30/2008   Personal history of alcoholism ([place]) 10/30/2008    Allergies:  Allergies  Allergen Reactions   Sulfamethoxazole-Trimethoprim     REACTION: itching   Sulfa Antibiotics Rash   Medications:  Current Outpatient Medications:    albuterol (PROAIR HFA) 108 (90 Base) MCG/ACT inhaler, INHALE 2 PUFFS INTO THE LUNGS EVERY 4 (FOUR) HOURS AS NEEDED. USE SPARINGLY, Disp: 8.5 each, Rfl: 4   atomoxetine (STRATTERA) 40 MG capsule, TAKE 1 CAPSULE(40 MG) BY MOUTH TWICE DAILY, Disp: 180 capsule, Rfl: 1   azelastine (ASTELIN) 0.1 % nasal spray, Place 1 spray into both nostrils 2 (two) times daily. Use in each nostril as directed, Disp: , Rfl:    chlorproMAZINE (THORAZINE) 25 MG tablet, Take 25 mg by mouth daily as needed., Disp: , Rfl:    EPINEPHrine 0.3 mg/0.3 mL IJ SOAJ injection, USE UTD PRF SYSTEMATIC REACTION., Disp: , Rfl: 1   escitalopram (LEXAPRO) 20 MG tablet, Take 1 tablet (20 mg total) by mouth daily., Disp: 90 tablet, Rfl: 3   fluticasone (FLONASE) 50 MCG/ACT nasal spray, Place 1 spray into both nostrils daily., Disp: 16 g, Rfl: 5   hyoscyamine (LEVSIN SL) 0.125 MG SL tablet, Place 1 tablet (0.125 mg total) under the tongue every 4 (four) hours as needed., Disp: 30 tablet, Rfl: 11   levocetirizine (XYZAL) 5 MG tablet, SMARTSIG:1 Tablet(s) By Mouth Every Evening, Disp: , Rfl:    lidocaine-prilocaine (EMLA) cream, APPLY TOPICALLY 4 (FOUR) TIMES DAILY AS NEEDED. FOUR TIMES A DAY TO AFFECTED AREA, 1 MONTH SUPPLY, Disp: 30 g, Rfl: 2   meloxicam (MOBIC) 15 MG tablet, TAKE 1 TABLET(15 MG) BY MOUTH DAILY WITH FOOD AS NEEDED FOR PAIN, Disp: 30 tablet, Rfl: 1   PAZEO 0.7 % SOLN, , Disp: , Rfl: 3   SUMAtriptan (IMITREX) 100 MG tablet, Take by mouth., Disp: , Rfl:    tiZANidine (ZANAFLEX) 4 MG tablet, TAKE 1 TABLET(4 MG) BY MOUTH AT BEDTIME, Disp: 90 tablet, Rfl: 1   triamcinolone cream (KENALOG) 0.1 %, Apply 1 application topically  2 (two) times daily., Disp: 30 g, Rfl: 0   valACYclovir (VALTREX) 500 MG tablet, Take 1 tablet (500 mg total) by mouth 2 (two) times daily. For 3 days during a flare, Disp: 30 tablet, Rfl: 5  Observations/Objective: Patient is well-developed, well-nourished in no acute distress.  Resting comfortably at home in bed Head is normocephalic, atraumatic.  No labored breathing.  Speech is clear and coherent with logical content.  Patient is alert and oriented at baseline.    Assessment and Plan: 1. Acute right-sided low back pain without sciatica  2. RLQ abdominal pain  3. Personal history of kidney stones  - Since pain was so acute and unable to obtain proper workup virtually, patient advised to proceed to ER; she voiced agreement  Follow Up Instructions: I discussed the assessment and treatment plan with the patient. The patient was provided an opportunity to ask questions and all were answered. The patient agreed with the  plan and demonstrated an understanding of the instructions.  A copy of instructions were sent to the patient via MyChart unless otherwise noted below.    The patient was advised to call back or seek an in-person evaluation if the symptoms worsen or if the condition fails to improve as anticipated.  Time:  I spent 10 minutes with the patient via telehealth technology discussing the above problems/concerns.    Mar Daring, PA-C

## 2021-09-12 NOTE — ED Notes (Signed)
Patient transported to CT 

## 2021-09-12 NOTE — Patient Instructions (Signed)
Based on what you shared with me, I feel your condition warrants further evaluation as soon as possible at an Emergency department.  ? ?  ?If you are having a true medical emergency please call 911.   ?  ? ?Emergency Department-[facility]  ?Get Driving Directions  ?[phone]  ?[address]  ?[place], [place] [address]  ?Open 24/7/365  ?  ?  ?[facility] at [address]  ?Get Driving Directions  ?[address]  ?[place], [place] [address]  ?Open 24/7/365  ?  ?Emergency Department- [facility]  ?Get Driving Directions  ?[phone]  ?[address]  ?[place], [place] [address]  ?Open 24/7/365  ?  ?  ?[facility] at [facility]  ?Get Driving Directions  ?[phone]  ?[address]  ?[place], [place] [address]  ?Open 24/7/365  ?  ?[place]  ?Emergency Department- [facility]  ?Get Driving Directions  ?[phone]  ?[address]  ?[place], [place] [address]  ?Open 24/7/365  ?  ?[place]  ?Emergency Department- [facility] [place]  ?Get Driving Directions  ?[address]  ?[place],  [address]  ?Open 24/7/365  ?  ?[place]  ?Emergency Department- [facility]  ?Get Driving Directions  ?[phone]  ?[address]  ?[place],  [address]  ?Open 24/7/365  ?  ?

## 2021-09-12 NOTE — ED Notes (Signed)
Patient verbalizes understanding of discharge instructions. Opportunity for questioning and answers were provided. Patient discharged from ED.  °

## 2021-09-13 ENCOUNTER — Encounter: Payer: Self-pay | Admitting: Family Medicine

## 2021-09-14 MED ORDER — KETOROLAC TROMETHAMINE 10 MG PO TABS: 10.0000 mg | ORAL_TABLET | Freq: Four times a day (QID) | ORAL | 0 refills | Status: AC | PRN

## 2021-09-25 ENCOUNTER — Telehealth: Payer: BC Managed Care – PPO | Admitting: Nurse Practitioner

## 2021-09-25 DIAGNOSIS — U071 COVID-19: Secondary | ICD-10-CM | POA: Diagnosis not present

## 2021-09-25 MED ORDER — NIRMATRELVIR/RITONAVIR (PAXLOVID) TABLET (RENAL DOSING): 2.0000 | ORAL_TABLET | Freq: Two times a day (BID) | ORAL | 0 refills | Status: AC

## 2021-09-25 MED ORDER — PREDNISONE 20 MG PO TABS: 20.0000 mg | ORAL_TABLET | Freq: Every day | ORAL | 0 refills | Status: AC

## 2021-09-25 MED ORDER — PSEUDOEPH-BROMPHEN-DM 30-2-10 MG/5ML PO SYRP: 5.0000 mL | ORAL_SOLUTION | Freq: Four times a day (QID) | ORAL | 0 refills | Status: AC | PRN

## 2021-09-25 NOTE — Patient Instructions (Addendum)
Reino Bellis [facility], thank you for joining Reynolds Bowl, NP for today's virtual visit.  While this provider is not your primary care provider (PCP), if your PCP is located in our provider database this encounter information will be shared with them immediately following your visit.  Consent: (Patient) Candice Mcgrath provided verbal consent for this virtual visit at the beginning of the encounter.  Current Medications:  Current Outpatient Medications:    brompheniramine-pseudoephedrine-DM 30-2-10 MG/5ML syrup, Take 5 mLs by mouth 4 (four) times daily as needed for up to 7 days., Disp: 140 mL, Rfl: 0   nirmatrelvir/ritonavir EUA, renal dosing, (PAXLOVID) 10 x 150 MG & 10 x 100MG  TABS, Take 2 tablets by mouth 2 (two) times daily for 5 days. (Take nirmatrelvir 150 mg one tablet twice daily for 5 days and ritonavir 100 mg one tablet twice daily for 5 days) Patient GFR is 0.75 on 09/12/21, Disp: 20 tablet, Rfl: 0   predniSONE (DELTASONE) 20 MG tablet, Take 1 tablet (20 mg total) by mouth daily with breakfast for 5 days., Disp: 5 tablet, Rfl: 0   albuterol (PROAIR HFA) 108 (90 Base) MCG/ACT inhaler, INHALE 2 PUFFS INTO THE LUNGS EVERY 4 (FOUR) HOURS AS NEEDED. USE SPARINGLY, Disp: 8.5 each, Rfl: 4   atomoxetine (STRATTERA) 40 MG capsule, TAKE 1 CAPSULE(40 MG) BY MOUTH TWICE DAILY, Disp: 180 capsule, Rfl: 1   azelastine (ASTELIN) 0.1 % nasal spray, Place 1 spray into both nostrils 2 (two) times daily. Use in each nostril as directed, Disp: , Rfl:    chlorproMAZINE (THORAZINE) 25 MG tablet, Take 25 mg by mouth daily as needed., Disp: , Rfl:    EPINEPHrine 0.3 mg/0.3 mL IJ SOAJ injection, USE UTD PRF SYSTEMATIC REACTION., Disp: , Rfl: 1   escitalopram (LEXAPRO) 20 MG tablet, Take 1 tablet (20 mg total) by mouth daily., Disp: 90 tablet, Rfl: 3   fluticasone (FLONASE) 50 MCG/ACT nasal spray, Place 1 spray into both nostrils daily., Disp: 16 g, Rfl: 5   hyoscyamine (LEVSIN SL) 0.125 MG SL tablet,  Place 1 tablet (0.125 mg total) under the tongue every 4 (four) hours as needed., Disp: 30 tablet, Rfl: 11   ketorolac (TORADOL) 10 MG tablet, Take 1 tablet (10 mg total) by mouth every 6 (six) hours as needed for moderate pain or severe pain., Disp: 40 tablet, Rfl: 0   levocetirizine (XYZAL) 5 MG tablet, SMARTSIG:1 Tablet(s) By Mouth Every Evening, Disp: , Rfl:    lidocaine-prilocaine (EMLA) cream, APPLY TOPICALLY 4 (FOUR) TIMES DAILY AS NEEDED. FOUR TIMES A DAY TO AFFECTED AREA, 1 MONTH SUPPLY, Disp: 30 g, Rfl: 2   meloxicam (MOBIC) 15 MG tablet, TAKE 1 TABLET(15 MG) BY MOUTH DAILY WITH FOOD AS NEEDED FOR PAIN, Disp: 30 tablet, Rfl: 1   PAZEO 0.7 % SOLN, , Disp: , Rfl: 3   SUMAtriptan (IMITREX) 100 MG tablet, Take by mouth., Disp: , Rfl:    tiZANidine (ZANAFLEX) 4 MG tablet, TAKE 1 TABLET(4 MG) BY MOUTH AT BEDTIME, Disp: 90 tablet, Rfl: 1   triamcinolone cream (KENALOG) 0.1 %, Apply 1 application topically 2 (two) times daily., Disp: 30 g, Rfl: 0   valACYclovir (VALTREX) 500 MG tablet, Take 1 tablet (500 mg total) by mouth 2 (two) times daily. For 3 days during a flare, Disp: 30 tablet, Rfl: 5   Medications ordered in this encounter:  Meds ordered this encounter  Medications   nirmatrelvir/ritonavir EUA, renal dosing, (PAXLOVID) 10 x 150 MG & 10 x 100MG  TABS  Sig: Take 2 tablets by mouth 2 (two) times daily for 5 days. (Take nirmatrelvir 150 mg one tablet twice daily for 5 days and ritonavir 100 mg one tablet twice daily for 5 days) Patient GFR is 0.75 on 09/12/21    Dispense:  20 tablet    Refill:  0   predniSONE (DELTASONE) 20 MG tablet    Sig: Take 1 tablet (20 mg total) by mouth daily with breakfast for 5 days.    Dispense:  5 tablet    Refill:  0   brompheniramine-pseudoephedrine-DM 30-2-10 MG/5ML syrup    Sig: Take 5 mLs by mouth 4 (four) times daily as needed for up to 7 days.    Dispense:  140 mL    Refill:  0     *If you need refills on other medications prior to your next  appointment, please contact your pharmacy*  Follow-Up: Call back or seek an in-person evaluation if the symptoms worsen or if the condition fails to improve as anticipated.  Other Instructions Will prescribe Paxlovid, recent creatinine 0.75. Will also prescribe symptomatic treatment to include  Bromfed, will prescribe Prednisone for asthma related symptoms. May use albuterol inhaler or nebulizer as needed. Discussed isolation with patient. Instructed to go to the ER if she develops SOB, difficulty breathing or other concerns.    If you have been instructed to have an in-person evaluation today at a local Urgent Care facility, please use the link below. It will take you to a list of all of our available [facility], including address, phone number and hours of operation. Please do not delay care.  [facility]  If you or a family member do not have a primary care provider, use the link below to schedule a visit and establish care. When you choose a [facility] primary care physician or advanced practice provider, you gain a long-term partner in health. Find a Primary Care Provider  Learn more about [facility]'s in-office and virtual care options: [facility]

## 2021-09-25 NOTE — Progress Notes (Signed)
Virtual Visit Consent   Candice Mcgrath [facility], you are scheduled for a virtual visit with a [facility] provider today.     Just as with appointments in the office, your consent must be obtained to participate.  Your consent will be active for this visit and any virtual visit you may have with one of our providers in the next 365 days.     If you have a MyChart account, a copy of this consent can be sent to you electronically.  All virtual visits are billed to your insurance company just like a traditional visit in the office.    As this is a virtual visit, video technology does not allow for your provider to perform a traditional examination.  This may limit your provider's ability to fully assess your condition.  If your provider identifies any concerns that need to be evaluated in person or the need to arrange testing (such as labs, EKG, etc.), we will make arrangements to do so.     Although advances in technology are sophisticated, we cannot ensure that it will always work on either your end or our end.  If the connection with a video visit is poor, the visit may have to be switched to a telephone visit.  With either a video or telephone visit, we are not always able to ensure that we have a secure connection.     I need to obtain your verbal consent now.   Are you willing to proceed with your visit today?    Candice Mcgrath has provided verbal consent on 09/25/2021 for a virtual visit (video or telephone).   Candice Bowl, NP   Date: 09/25/2021 9:31 AM   Virtual Visit via Video Note   I, Candice Mcgrath, connected with  Candice Mcgrath  ([id number], [birth year]) on 09/25/21 at  9:30 AM EST by a video-enabled telemedicine application and verified that I am speaking with the correct person using two identifiers.  Location: Patient: Virtual Visit Location Patient: Home Provider: Virtual Visit Location Provider: Home   I discussed the limitations of evaluation and management  by telemedicine and the availability of in person appointments. The patient expressed understanding and agreed to proceed.    History of Present Illness: Candice Mcgrath is a 45 y.o. who identifies as a female who was assigned female at birth, and is being seen today for a positive COVID test. Symptoms started  1 day ago. She complains of cough, nasal congestion, sinus pressure. She denies shortness of breath, difficulty breathing, GI sy.ptoms. She tested positive for COVID this morning. 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 She has had COVID in the past, has received COVID and influenza vaccines. She has a history of asthma and DM. HPI: HPI  Problems:  Patient Active Problem List   Diagnosis Date Noted   Mild persistent asthma, uncomplicated 30/86/5784   Diabetes mellitus type 2, diet-controlled ([place]) 12/06/2020   Generalized anxiety disorder 09/02/2014   Major depressive disorder, recurrent episode, in partial remission ([place]) 05/18/2014   Personal history of drug dependence ([place]) 12/24/2012   Migraine headache 12/24/2012   IBS 12/13/2009   Hyperlipidemia 11/08/2009   ADD 11/08/2009   Allergic rhinitis 11/01/2008   NEPHROLITHIASIS, HX OF 11/01/2008   GENITAL HERPES 10/30/2008   Personal history of alcoholism ([place]) 10/30/2008    Allergies:  Allergies  Allergen Reactions   Sulfamethoxazole-Trimethoprim     REACTION: itching   Sulfa Antibiotics Rash  Medications:  Current Outpatient Medications:    albuterol (PROAIR HFA) 108 (90 Base) MCG/ACT inhaler, INHALE 2 PUFFS INTO THE LUNGS EVERY 4 (FOUR) HOURS AS NEEDED. USE SPARINGLY, Disp: 8.5 each, Rfl: 4   atomoxetine (STRATTERA) 40 MG capsule, TAKE 1 CAPSULE(40 MG) BY MOUTH TWICE DAILY, Disp: 180 capsule, Rfl: 1   azelastine (ASTELIN) 0.1 % nasal spray, Place 1 spray into both nostrils 2 (two) times daily. Use in each nostril as directed, Disp: , Rfl:    chlorproMAZINE (THORAZINE) 25 MG  tablet, Take 25 mg by mouth daily as needed., Disp: , Rfl:    EPINEPHrine 0.3 mg/0.3 mL IJ SOAJ injection, USE UTD PRF SYSTEMATIC REACTION., Disp: , Rfl: 1   escitalopram (LEXAPRO) 20 MG tablet, Take 1 tablet (20 mg total) by mouth daily., Disp: 90 tablet, Rfl: 3   fluticasone (FLONASE) 50 MCG/ACT nasal spray, Place 1 spray into both nostrils daily., Disp: 16 g, Rfl: 5   hyoscyamine (LEVSIN SL) 0.125 MG SL tablet, Place 1 tablet (0.125 mg total) under the tongue every 4 (four) hours as needed., Disp: 30 tablet, Rfl: 11   ketorolac (TORADOL) 10 MG tablet, Take 1 tablet (10 mg total) by mouth every 6 (six) hours as needed for moderate pain or severe pain., Disp: 40 tablet, Rfl: 0   levocetirizine (XYZAL) 5 MG tablet, SMARTSIG:1 Tablet(s) By Mouth Every Evening, Disp: , Rfl:    lidocaine-prilocaine (EMLA) cream, APPLY TOPICALLY 4 (FOUR) TIMES DAILY AS NEEDED. FOUR TIMES A DAY TO AFFECTED AREA, 1 MONTH SUPPLY, Disp: 30 g, Rfl: 2   meloxicam (MOBIC) 15 MG tablet, TAKE 1 TABLET(15 MG) BY MOUTH DAILY WITH FOOD AS NEEDED FOR PAIN, Disp: 30 tablet, Rfl: 1   PAZEO 0.7 % SOLN, , Disp: , Rfl: 3   SUMAtriptan (IMITREX) 100 MG tablet, Take by mouth., Disp: , Rfl:    tiZANidine (ZANAFLEX) 4 MG tablet, TAKE 1 TABLET(4 MG) BY MOUTH AT BEDTIME, Disp: 90 tablet, Rfl: 1   triamcinolone cream (KENALOG) 0.1 %, Apply 1 application topically 2 (two) times daily., Disp: 30 g, Rfl: 0   valACYclovir (VALTREX) 500 MG tablet, Take 1 tablet (500 mg total) by mouth 2 (two) times daily. For 3 days during a flare, Disp: 30 tablet, Rfl: 5  Observations/Objective: Patient is well-developed, well-nourished in no acute distress.  Resting comfortably at home.  Head is normocephalic, atraumatic.  No labored breathing.  Speech is clear and coherent with logical content.  Patient is alert and oriented at baseline.    Assessment and Plan: 1. COVID-19 - nirmatrelvir/ritonavir EUA, renal dosing, (PAXLOVID) 10 x 150 MG & 10 x 100MG   TABS; Take 2 tablets by mouth 2 (two) times daily for 5 days. (Take nirmatrelvir 150 mg one tablet twice daily for 5 days and ritonavir 100 mg one tablet twice daily for 5 days) Patient GFR is 0.75 on 09/12/21  Dispense: 20 tablet; Refill: 0 - predniSONE (DELTASONE) 20 MG tablet; Take 1 tablet (20 mg total) by mouth daily with breakfast for 5 days.  Dispense: 5 tablet; Refill: 0 - brompheniramine-pseudoephedrine-DM 30-2-10 MG/5ML syrup; Take 5 mLs by mouth 4 (four) times daily as needed for up to 7 days.  Dispense: 140 mL; Refill: 0  Positive COVID test today. Will prescribe Paxlovid, recent creatinine 0.75. Will also prescribe symptomatic treatment to include  Bromfed, will prescribe Prednisone for asthma related symptoms. May use albuterol inhaler or nebulizer as needed. Discussed isolation with patient. Instructed to go to the ER if she develops  SOB, difficulty breathing or other concerns. Will provide note for work. Follow-up if symptoms worsen. Patient verbalizes understanding, all questions answered.   Follow Up Instructions: I discussed the assessment and treatment plan with the patient. The patient was provided an opportunity to ask questions and all were answered. The patient agreed with the plan and demonstrated an understanding of the instructions.  A copy of instructions were sent to the patient via MyChart unless otherwise noted below.   The patient was advised to call back or seek an in-person evaluation if the symptoms worsen or if the condition fails to improve as anticipated.  Time:  I spent 12 minutes with the patient via telehealth technology discussing the above problems/concerns.    Candice Bowl, NP

## 2021-11-13 ENCOUNTER — Other Ambulatory Visit: Payer: Self-pay

## 2021-11-13 ENCOUNTER — Emergency Department (INDEPENDENT_AMBULATORY_CARE_PROVIDER_SITE_OTHER)
Admission: RE | Admit: 2021-11-13 | Discharge: 2021-11-13 | Disposition: A | Discharge status: Home / Self Care | Payer: BC Managed Care – PPO | Source: Ambulatory Visit

## 2021-11-13 VITALS — BP 106/73 | HR 104 | Temp 98.1°F | Resp 18 | Ht 61.0 in | Wt 196.0 lb

## 2021-11-13 DIAGNOSIS — J209 Acute bronchitis, unspecified: Secondary | ICD-10-CM

## 2021-11-13 MED ORDER — PREDNISONE 50 MG PO TABS: ORAL_TABLET | ORAL | 0 refills | Status: DC

## 2021-11-13 MED ORDER — METHYLPREDNISOLONE SODIUM SUCC 125 MG IJ SOLR
125.0000 mg | Freq: Once | INTRAMUSCULAR | Status: AC
  Administered 2021-11-13: 125 mg via INTRAMUSCULAR

## 2021-11-13 NOTE — ED Triage Notes (Signed)
Pt states that she has a cough, chest congestion and fatigue. X4 days   Pt states that she had a negative home covid test which was negative.  Pt states that she is vaccinated for covid. Pt states that she has her flu vaccine.

## 2021-11-15 NOTE — ED Provider Notes (Signed)
[name]    CSN: [id number] Arrival date & time: 11/13/21  1439      History   Chief Complaint Chief Complaint  Patient presents with   Cough    Cough, chest congestion, and fatigue. X4 days    HPI Candice Mcgrath is a 46 y.o. female.   The history is provided by the patient. No language interpreter was used.  Cough Cough characteristics:  Non-productive Sputum characteristics:  Nondescript Severity:  Moderate Duration:  4 days Timing:  Constant Progression:  Worsening Chronicity:  New Context: upper respiratory infection   Relieved by:  Nothing Ineffective treatments:  None tried Associated symptoms: no sinus congestion and no sore throat    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[place])    adderrall, Xanax, MJ, others, no IV   Hyperlipidemia    IBS (irritable bowel syndrome)    Migraine headache 12/24/2012   Nephrolithiasis     Patient Active Problem List   Diagnosis Date Noted   Mild persistent asthma, uncomplicated 24/40/1027   Diabetes mellitus type 2, diet-controlled ([place]) 12/06/2020   Generalized anxiety disorder 09/02/2014   Major depressive disorder, recurrent episode, in partial remission ([place]) 05/18/2014   Personal history of drug dependence ([place]) 12/24/2012   Migraine headache 12/24/2012   IBS 12/13/2009   Hyperlipidemia 11/08/2009   ADD 11/08/2009   Allergic rhinitis 11/01/2008   NEPHROLITHIASIS, HX OF 11/01/2008   GENITAL HERPES 10/30/2008   Personal history of alcoholism ([place]) 10/30/2008    Past Surgical History:  Procedure Laterality Date   Basket Surgery  2005, 06   kidney stones   TYMPANOSTOMY TUBE PLACEMENT Bilateral    as a child    OB History   No obstetric history on file.      Home Medications    Prior to Admission medications   Medication Sig Start Date End Date  Taking? Authorizing Provider  albuterol (PROAIR HFA) 108 (90 Base) MCG/ACT inhaler INHALE 2 PUFFS INTO THE LUNGS EVERY 4 (FOUR) HOURS AS NEEDED. USE SPARINGLY 03/06/16  Yes Copland, Frederico Hamman, MD  atomoxetine (STRATTERA) 40 MG capsule TAKE 1 CAPSULE(40 MG) BY MOUTH TWICE DAILY 08/31/21  Yes Copland, Frederico Hamman, MD  azelastine (ASTELIN) 0.1 % nasal spray Place 1 spray into both nostrils 2 (two) times daily. Use in each nostril as directed   Yes [provider]  chlorproMAZINE (THORAZINE) 25 MG tablet Take 25 mg by mouth daily as needed. 10/07/20  Yes [provider]  EPINEPHrine 0.3 mg/0.3 mL IJ SOAJ injection USE UTD PRF SYSTEMATIC REACTION. 04/06/17  Yes [provider]  escitalopram (LEXAPRO) 20 MG tablet Take 1 tablet (20 mg total) by mouth daily. 12/06/20  Yes Copland, Frederico Hamman, MD  fluticasone (FLONASE) 50 MCG/ACT nasal spray Place 1 spray into both nostrils daily. 03/06/16  Yes Copland, Frederico Hamman, MD  hyoscyamine (LEVSIN SL) 0.125 MG SL tablet Place 1 tablet (0.125 mg total) under the tongue every 4 (four) hours as needed. 08/10/20  Yes Copland, Frederico Hamman, MD  ketorolac (TORADOL) 10 MG tablet Take 1 tablet (10 mg total) by mouth every 6 (six) hours as needed for moderate pain or severe pain. 09/14/21  Yes Copland, Frederico Hamman, MD  levocetirizine (XYZAL) 5 MG tablet SMARTSIG:1 Tablet(s) By Mouth Every Evening 05/22/20  Yes [provider]  lidocaine-prilocaine (EMLA) cream APPLY TOPICALLY  4 (FOUR) TIMES DAILY AS NEEDED. FOUR TIMES A DAY TO AFFECTED AREA, 1 MONTH SUPPLY 08/10/20  Yes Copland, Frederico Hamman, MD  meloxicam (MOBIC) 15 MG tablet TAKE 1 TABLET(15 MG) BY MOUTH DAILY WITH FOOD AS NEEDED FOR PAIN 03/07/21  Yes Copland, Frederico Hamman, MD  PAZEO 0.7 % SOLN  04/06/17  Yes [provider]  predniSONE (DELTASONE) 50 MG tablet One tablet a day 11/13/21  Yes Olumide Dolinger K, PA-C  SUMAtriptan (IMITREX) 100 MG tablet Take by mouth. 11/25/20  Yes [provider]  tiZANidine  (ZANAFLEX) 4 MG tablet TAKE 1 TABLET(4 MG) BY MOUTH AT BEDTIME 06/10/21  Yes Copland, Spencer, MD  triamcinolone cream (KENALOG) 0.1 % Apply 1 application topically 2 (two) times daily. 05/07/21  Yes Alene Mires, Sahar M, PA-C  valACYclovir (VALTREX) 500 MG tablet Take 1 tablet (500 mg total) by mouth 2 (two) times daily. For 3 days during a flare 09/27/17  Yes Copland, Frederico Hamman, MD    Family History Family History  Problem Relation Age of Onset   Stroke Maternal Grandmother    Cancer Maternal Grandfather    Heart disease Paternal Grandfather    Alcohol abuse Other        GP   Breast cancer Cousin    Stroke Other        [name]    Social History Social History   Tobacco Use   Smoking status: Former    Packs/day: 0.50    Types: Cigarettes    Quit date: 06/24/2016    Years since quitting: 5.3   Smokeless tobacco: Former    Quit date: 12/04/2008  Substance Use Topics   Alcohol use: Not Currently    Alcohol/week: 2.0 - 4.0 standard drinks    Types: 2 - 4 Cans of beer per week    Comment: recovering AA   Drug use: No    Comment: Recovering NA     Allergies   Sulfamethoxazole-trimethoprim and Sulfa antibiotics   Review of Systems Review of Systems  HENT:  Negative for sore throat.   Respiratory:  Positive for cough.   All other systems reviewed and are negative.   Physical Exam Triage Vital Signs ED Triage Vitals  Enc Vitals Group     BP 11/13/21 1453 106/73     Pulse Rate 11/13/21 1453 (!) 104     Resp 11/13/21 1453 18     Temp 11/13/21 1453 98.1 F (36.7 C)     Temp Source 11/13/21 1453 Oral     SpO2 11/13/21 1453 96 %     Weight 11/13/21 1451 196 lb (88.9 kg)     Height 11/13/21 1451 5\' 1"  (1.549 m)     Head Circumference --      Peak Flow --      Pain Score 11/13/21 1450 0     Pain Loc --      Pain Edu? --      Excl. in [place]? --    No data found.  Updated Vital Signs BP 106/73 (BP Location: Left Arm)    Pulse (!) 104    Temp 98.1 F (36.7 C) (Oral)    Resp 18     Ht 5\' 1"  (1.549 m)    Wt 88.9 kg    LMP 11/02/2021    SpO2 96%    BMI 37.03 kg/m   Visual Acuity Right Eye Distance:   Left Eye Distance:   Bilateral Distance:    Right Eye Near:   Left Eye Near:  Bilateral Near:     Physical Exam Vitals and nursing note reviewed.  Constitutional:      Appearance: She is well-developed.  HENT:     Head: Normocephalic.  Cardiovascular:     Rate and Rhythm: Normal rate.  Pulmonary:     Effort: Pulmonary effort is normal.  Abdominal:     General: There is no distension.  Musculoskeletal:        General: Normal range of motion.     Cervical back: Normal range of motion.  Neurological:     Mental Status: She is alert and oriented to person, place, and time.     UC Treatments / Results  Labs (all labs ordered are listed, but only abnormal results are displayed) Labs Reviewed - No data to display  EKG   Radiology No results found.  Procedures Procedures (including critical care time)  Medications Ordered in UC Medications  methylPREDNISolone sodium succinate (SOLU-MEDROL) 125 mg/2 mL injection 125 mg (125 mg Intramuscular Given 11/13/21 1517)    Initial Impression / Assessment and Plan / UC Course  I have reviewed the triage vital signs and the nursing notes.  Pertinent labs & imaging results that were available during my care of the patient were reviewed by me and considered in my medical decision making (see chart for details).      Final Clinical Impressions(s) / UC Diagnoses   Final diagnoses:  Acute bronchitis, unspecified organism   Discharge Instructions   None    ED Prescriptions     Medication Sig Dispense Auth. Provider   predniSONE (DELTASONE) 50 MG tablet One tablet a day 6 tablet Fransico Meadow, [place]      PDMP not reviewed this encounter.    Fransico Meadow, [place] 11/15/21 1948

## 2021-12-05 ENCOUNTER — Other Ambulatory Visit: Payer: Self-pay | Admitting: Family Medicine

## 2021-12-07 NOTE — Telephone Encounter (Signed)
Refill request Zanaflex last refill 06/10/21 #90/1 Lexapro last refill 12/06/20 #90/3 See drug warning with chlorpromazine, ketorolac and meloxicam Last visit video 08/29/21

## 2021-12-19 ENCOUNTER — Encounter: Payer: Self-pay | Admitting: Family Medicine

## 2021-12-20 ENCOUNTER — Ambulatory Visit (HOSPITAL_COMMUNITY)
Admission: EM | Admit: 2021-12-20 | Discharge: 2021-12-20 | Disposition: A | Discharge status: Home / Self Care | Payer: BC Managed Care – PPO

## 2021-12-20 DIAGNOSIS — F3289 Other specified depressive episodes: Secondary | ICD-10-CM

## 2021-12-20 NOTE — Discharge Instructions (Signed)
Please follow up with these two [facility] providers by calling the listed numbers:  Dr. Darleene Cleaver [phone] [place] [address] [place], [place] [address]  [place] [facility] [place],  [place]  [address]  Patient is instructed prior to discharge to:  Take all medications as prescribed by his/her mental healthcare provider. Report any adverse effects and or reactions from the medicines to his/her outpatient provider promptly. Keep all scheduled appointments, to ensure that you are getting refills on time and to avoid any interruption in your medication.  If you are unable to keep an appointment call to reschedule.  Be sure to follow-up with resources and follow-up appointments provided.  Patient has been instructed & cautioned: To not engage in alcohol and or illegal drug use while on prescription medicines. In the event of worsening symptoms, patient is instructed to call the crisis hotline, 911 and or go to the nearest ED for appropriate evaluation and treatment of symptoms. To follow-up with his/her primary care provider for your other medical issues, concerns and or health care needs.

## 2021-12-20 NOTE — Telephone Encounter (Signed)
Left v/m for pt to call [facility] for additional information and also sent my chart note. Pt needs to be triaged.

## 2021-12-20 NOTE — ED Provider Notes (Signed)
Behavioral Health Urgent Care Medical Screening Exam  Patient Name: Candice Mcgrath MRN: [id number] Date of Evaluation: 12/20/21 Chief Complaint:  depression Diagnosis:  Final diagnoses:  Other depression    History of Present illness: Candice Mcgrath is a 46 y.o. female with a past psychiatric history of major depressive disorder, generalized anxiety disorder, ADHD, and alcohol use disorder.  She presents today to the [facility] behavioral health urgent care for depressed mood.  The patient reports that she had a disturbing thought last night, namely "nobody would care if I weren't here".  She reports that this thought was fleeting in nature and denies having any suicidal thoughts.  She states that she has never had a plan previously to harm herself, denies previous suicide attempts, denies previous self-injurious behavior.  She denies access to lethal means.  The patient feels that she can be safe at home.   She reports worsening depression over the course of the past several months.  Her only other symptom other than depressed mood is decreased energy.  She reports good sleep, interest in activities, good concentration, and appropriate appetite.  She denies experiencing any auditory/visual hallucinations or homicidal thoughts.  She denies chronic feelings of emptiness, mood swings, or unstable intimate relationships.  She does report some fluctuation of her mood with her period.  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  She reports that she resides in [place] with a spouse who is supportive.  She reports previous significant substance use problems with Adderall, Xanax, and alcohol.  She reports going to [employer] in 2007, which led to a long period of sobriety.  The patient denies any recent use of alcohol or illegal drugs or misuse of prescription drugs.   The patient follows closely with her primary care  physician Dr. Lorelei Pont.  He regularly prescribes the patient the following medications: -Lexapro 20 mg -Thorazine 25 mg as needed -Strattera -Zanaflex   Psychiatric Specialty Exam  Presentation  General Appearance:Appropriate for Environment; Casual  Eye Contact:Good  Speech:Clear and Coherent  Speech Volume:Normal  Handedness:No data recorded  Mood and Affect  Mood:Depressed  Affect:Congruent   Thought Process  Thought Processes:Coherent; Linear  Descriptions of Associations:Intact  Orientation:Full (Time, Place and Person)  Thought Content:Logical    Hallucinations:None  Ideas of Reference:None  Suicidal Thoughts:No  Homicidal Thoughts:No   Sensorium  Memory:Immediate Good; Recent Good; Remote Good  Judgment:Fair  Insight:Fair   Executive Functions  Concentration:Good  Attention Span:Good  [place] of Knowledge:Good  Language:Good   Psychomotor Activity  Psychomotor Activity:Normal   Assets  Assets:Housing; Financial Resources/Insurance   Sleep  Sleep:Fair  Number of hours: No data recorded  No data recorded  Physical Exam: Physical Exam Vitals reviewed.  Constitutional:      Appearance: He is not toxic-appearing.  Pulmonary:     Effort: Pulmonary effort is normal.  Neurological:     General: No focal deficit present.     Mental Status: He is alert and oriented to person, place, and time.   Review of Systems  Constitutional:  Negative for chills and fever.  HENT:  Negative for sore throat.   Eyes:  Negative for blurred vision and double vision.  Respiratory:  Negative for shortness of breath.   Cardiovascular:  Negative for chest pain.  Gastrointestinal:  Negative for abdominal pain, constipation, diarrhea, nausea and vomiting.  Genitourinary:  Negative for dysuria.  Neurological:  Negative for headaches.  Blood pressure 131/89, pulse (!) 104, temperature 98.6 F (37 C), temperature source Oral, resp. rate  17, SpO2 98 %. There is no height or weight on file to calculate BMI.  Musculoskeletal: Strength & Muscle Tone: within normal limits Gait & Station: normal Patient leans: N/A   [place] [facility] Discharge Disposition for Follow up and Recommendations: Based on my evaluation the patient does not appear to have an emergency medical condition and can be discharged with resources and follow up care in outpatient services for Medication Management and Individual Therapy. Patient given resources for two local psychiatric practices that take her insurance. Patient did not wish to pursue IOP/PHP.    Corky Sox, MD PGY-1

## 2021-12-20 NOTE — Progress Notes (Signed)
°   12/20/21 1031  [place] (Walk-ins at [facility] only)  How Did You Hear About [place]? Self  What Is the Reason for Your Visit/Call Today? Candice Mcgrath 46 year old present to [facility] reporting that her depression is super low. Denied knowing the trigger.  Report history of depression for the past 10-years. Report taking medication as prescribed Lexapro. Denied SI/HI/ and AVH. Report the triage nurse at her doctor's office [facility] recommended she come to be seen. Patient report she told the nurse last night she had thougths, 'no one would miss me if I was gone.' Denied history of suicidal intent or inpatient hospitalization for mental health. Report she's in recovery for addiction to alcohol, xanax, THC and adderall. Report has an appointment scheduled with her primary provider Dr. Lorelei Pont on 12/26/2021.  How Long Has This Been Causing You Problems? <Week  Have You Recently Had Any Thoughts About Hurting Yourself? No  Are You Planning to Commit Suicide/Harm Yourself At This time? No  Have you Recently Had Thoughts About [place]? No  Are You Planning To Harm Someone At This Time? No  Are you currently experiencing any auditory, visual or other hallucinations? No  Have You Used Any Alcohol or Drugs in the Past 24 Hours? No  Do you have any current medical co-morbidities that require immediate attention? No  Clinician description of patient physical appearance/behavior: Patient present in a pleasant mood and cooperative. She was dressed appropriately for the weather.  What Do You Feel Would Help You the Most Today? Stress Management  If access to [facility] Urgent Care was not available, would you have sought care in the Emergency Department? No  Determination of Need Routine (7 days)  Options For Referral Other: Comment

## 2021-12-20 NOTE — Telephone Encounter (Signed)
I spoke with pt; pt said that she has been under a ton of stress at work, menstrual period started a wk early and pt said she just feels off. Pt said it could be hormones along with stress from work. Pt has been taking lexapro 20 mg one daily and has not missed any meds. Pt said for the last couple of days depression has been worse. Pt said last night she thought "if she was not here no one would care.pt said she does not feel like she wants to die but she knows something is off. Pt does have a therapist that she talks with and pt said she has been to behavioral health before. Pt said she does not need to go to ED at this time. I called [employer] [facility] and spoke with Bedelia Person [phone] to transfer pt but Bedelia Person said they do not talk to pts over phone would need to come in for assessment [place], [place]. Pt said she knew where that was behind DSS and she will go over there now. Pt said she could drive herself and pt will cb with update on how she is doing. ED precautions given again and pt voiced understanding. Sending note to Dr Lorelei Pont who is out of office today and Butch Penny CMA.

## 2021-12-20 NOTE — Telephone Encounter (Signed)
Please call and triage.  

## 2021-12-20 NOTE — ED Notes (Signed)
Pt discharged with  AVS.  AVS reviewed prior to discharge.  Pt alert, oriented, and ambulatory.  Safety maintained.  °

## 2021-12-20 NOTE — Telephone Encounter (Signed)
Thank you for your help.  I think that is a good decision.  I have known this patient for many years, and this is different from her baseline.

## 2021-12-26 ENCOUNTER — Other Ambulatory Visit: Payer: Self-pay

## 2021-12-26 ENCOUNTER — Encounter: Payer: Self-pay | Admitting: Family Medicine

## 2021-12-26 ENCOUNTER — Ambulatory Visit: Payer: BC Managed Care – PPO | Admitting: Family Medicine

## 2021-12-26 VITALS — BP 104/72 | HR 90 | Temp 98.4°F | Ht 61.0 in | Wt 204.4 lb

## 2021-12-26 DIAGNOSIS — Z13220 Encounter for screening for lipoid disorders: Secondary | ICD-10-CM

## 2021-12-26 DIAGNOSIS — Z131 Encounter for screening for diabetes mellitus: Secondary | ICD-10-CM

## 2021-12-26 DIAGNOSIS — F3341 Major depressive disorder, recurrent, in partial remission: Secondary | ICD-10-CM | POA: Diagnosis not present

## 2021-12-26 DIAGNOSIS — R5383 Other fatigue: Secondary | ICD-10-CM

## 2021-12-26 DIAGNOSIS — N926 Irregular menstruation, unspecified: Secondary | ICD-10-CM

## 2021-12-26 DIAGNOSIS — F411 Generalized anxiety disorder: Secondary | ICD-10-CM

## 2021-12-26 DIAGNOSIS — Z79899 Other long term (current) drug therapy: Secondary | ICD-10-CM | POA: Diagnosis not present

## 2021-12-26 MED ORDER — BUSPIRONE HCL 5 MG PO TABS: 5.0000 mg | ORAL_TABLET | Freq: Two times a day (BID) | ORAL | 2 refills | Status: DC

## 2021-12-26 NOTE — Progress Notes (Signed)
? ? ?Candice Mcgrath T. Candice Sago, MD, Candice Mcgrath ?Therapist, music at [facility] ?Candice Mcgrath ?Cand[place]gr[place], [id number] ? ?Phone: [phone]  FAX: [phone] ? ?Candice Mcgrath [place] - 46 y.o. female  MRN [id number]  Date of Birth: Oct 02, 1976 ? ?Date: 12/26/2021  PCP: Candice Loffler, MD  Referral: Candice Loffler, MD ? ?Chief Complaint  ?Patient presents with  ? Depression  ? ? ?This visit occurred during the SARS-CoV-2 public health emergency.  Safety protocols were in place, including screening questions prior to the visit, additional usage of staff PPE, and extensive cleaning of exam room while observing appropriate contact time as indicated for disinfecting solutions.  ? ?Subjective:  ? ?Candice Mcgrath is a 46 y.o. very pleasant female patient with Body mass index is 38.62 kg/m?. who presents with the following: ? ?BH follow-up.  Last week she did go to behavioral health with some fault of suicidality, and this has resolved.  They did not feel like she met criteria for inpatient and she did not have a significant risk to herself or others. ? ?She does have a Social worker, and she did see the counselor last week. ? ?She has been having issues at school, having issues with a very high classroom, and this has been stressing her out a lot. ? ?She does feel better than she did last, but she still has some generalized depression and anxiety. ? ?She has been on Lexapro 20 mg, and I placed her on this in November 2022.  This was increased from Lexapro 10. ?She also takes Oncologist. ? ?02/2019, no alcohol. ? ?On Lexapro 20 mg ?Straterra 40 mg ? ?- was on Prozac, Wellbutrin ? ?2 names, it looks like this was from the psychiatric resident. ? ? ?Review of Systems is noted in the HPI, as appropriate ? ?Objective:  ? ?BP 104/72   Pulse 90   Temp 98.4 ?F (36.9 ?C) (Oral)   Ht '5\' 1"'  (1.549 m)   Wt 204 lb 6 oz (92.7 kg)   LMP 12/17/2021   SpO2 98%   BMI 38.62 kg/m?  ? ?GEN: No acute distress;  alert,appropriate. ?PULM: Breathing comfortably in no respiratory distress ?PSYCH: Normally interactive.  Normal affect.  She is not labile. ? ?Laboratory and Imaging Data: ? ?Assessment and Plan:  ? ?  ICD-10-CM   ?1. Major depressive disorder, recurrent episode, in partial remission [facility])  F33.41 Ambulatory referral to Psychiatry  ?  TSH  ?  T3, free  ?  T4, free  ?  Vitamin B12  ?  FSH/LH  ?  Prolactin  ?  ?2. Generalized anxiety disorder  F41.1 Ambulatory referral to Psychiatry  ?  ?3. Other fatigue  R53.83 TSH  ?  T3, free  ?  T4, free  ?  Vitamin B12  ?  FSH/LH  ?  Prolactin  ?  ?4. Screening, lipid  Z13.220 Lipid panel  ?  ?5. Screening for diabetes mellitus  Z13.1 Hemoglobin A1c  ?  ?6. Encounter for long-term (current) use of medications  Q22.979 Basic metabolic panel  ?  CBC with Differential/Platelet  ?  Hepatic function panel  ?  FSH/LH  ?  Prolactin  ?  ?7. Abnormal menses  N92.6 FSH/LH  ?  Prolactin  ?  ? ?Total encounter time: 33 minutes. This includes total time spent on the day of encounter.  ? ?Decompensated depression and to a lesser extent decompensated anxiety.  I am going to add some BuSpar in addition to  her Lexapro 20 mg.  She does take Thorazine intermittently for headache, and there are some other interactions with this with other medication. ? ?She has some concern about potential hormonal driven causes.  While she does have a history of some PMDD, she certainly has generalized depression in the past as well. ? ?Think at this point, is also reasonable for her to discuss with psychiatry for additional options of management. ? ?Evaluate multiple endocrine levels. ? ?Meds ordered this encounter  ?Medications  ? busPIRone (BUSPAR) 5 MG tablet  ?  Sig: Take 1 tablet (5 mg total) by mouth 2 (two) times daily.  ?  Dispense:  60 tablet  ?  Refill:  2  ? ?Medications Discontinued During This Encounter  ?Medication Reason  ? predniSONE (DELTASONE) 50 MG tablet Completed Course  ? ?Orders Placed This  Encounter  ?Procedures  ? Lipid panel  ? Hemoglobin A1c  ? Basic metabolic panel  ? CBC with Differential/Platelet  ? Hepatic function panel  ? TSH  ? T3, free  ? T4, free  ? Vitamin B12  ? FSH/LH  ? Prolactin  ? Ambulatory referral to Psychiatry  ? ? ?Follow-up: No follow-ups on file. ? ?Dragon Medical One speech-to-text software was used for transcription in this dictation.  Possible transcriptional errors can occur using Editor, commissioning.  ? ?Signed, ? ?Candice Ambers T. Jerome Viglione, MD ? ? ?Outpatient Encounter Medications as of 12/26/2021  ?Medication Sig  ? albuterol (PROAIR HFA) 108 (90 Base) MCG/ACT inhaler INHALE 2 PUFFS INTO THE LUNGS EVERY 4 (FOUR) HOURS AS NEEDED. USE SPARINGLY  ? atomoxetine (STRATTERA) 40 MG capsule TAKE 1 CAPSULE(40 MG) BY MOUTH TWICE DAILY  ? azelastine (ASTELIN) 0.1 % nasal spray Place 1 spray into both nostrils 2 (two) times daily. Use in each nostril as directed  ? busPIRone (BUSPAR) 5 MG tablet Take 1 tablet (5 mg total) by mouth 2 (two) times daily.  ? chlorproMAZINE (THORAZINE) 25 MG tablet Take 25 mg by mouth daily as needed.  ? EPINEPHrine 0.3 mg/0.3 mL IJ SOAJ injection USE UTD PRF SYSTEMATIC REACTION.  ? escitalopram (LEXAPRO) 20 MG tablet TAKE 1 TABLET BY MOUTH EVERY DAY  ? fluticasone (FLONASE) 50 MCG/ACT nasal spray Place 1 spray into both nostrils daily.  ? hyoscyamine (LEVSIN SL) 0.125 MG SL tablet Place 1 tablet (0.125 mg total) under the tongue every 4 (four) hours as needed.  ? ketorolac (TORADOL) 10 MG tablet Take 1 tablet (10 mg total) by mouth every 6 (six) hours as needed for moderate pain or severe pain.  ? levocetirizine (XYZAL) 5 MG tablet SMARTSIG:1 Tablet(s) By Mouth Every Evening  ? lidocaine-prilocaine (EMLA) cream APPLY TOPICALLY 4 (FOUR) TIMES DAILY AS NEEDED. FOUR TIMES A DAY TO AFFECTED AREA, 1 MONTH SUPPLY  ? meloxicam (MOBIC) 15 MG tablet TAKE 1 TABLET(15 MG) BY MOUTH DAILY WITH FOOD AS NEEDED FOR PAIN  ? PAZEO 0.7 % SOLN   ? SUMAtriptan (IMITREX) 100 MG tablet  Take by mouth.  ? tiZANidine (ZANAFLEX) 4 MG tablet TAKE 1 TABLET BY MOUTH AT BEDTIME  ? triamcinolone cream (KENALOG) 0.1 % Apply 1 application topically 2 (two) times daily.  ? valACYclovir (VALTREX) 500 MG tablet Take 1 tablet (500 mg total) by mouth 2 (two) times daily. For 3 days during a flare  ? [DISCONTINUED] predniSONE (DELTASONE) 50 MG tablet One tablet a day  ? ?No facility-administered encounter medications on file as of 12/26/2021.  ?  ?

## 2021-12-27 LAB — TSH: TSH: 2.26 u[IU]/mL (ref 0.35–5.50)

## 2021-12-27 LAB — CBC WITH DIFFERENTIAL/PLATELET
Basophils Absolute: 0.1 10*3/uL (ref 0.0–0.1)
Basophils Relative: 1 % (ref 0.0–3.0)
Eosinophils Absolute: 0.2 10*3/uL (ref 0.0–0.7)
Eosinophils Relative: 1.8 % (ref 0.0–5.0)
HCT: 41.1 % (ref 36.0–46.0)
Hemoglobin: 13.8 g/dL (ref 12.0–15.0)
Lymphocytes Relative: 22.6 % (ref 12.0–46.0)
Lymphs Abs: 2.7 10*3/uL (ref 0.7–4.0)
MCHC: 33.4 g/dL (ref 30.0–36.0)
MCV: 92 fl (ref 78.0–100.0)
Monocytes Absolute: 0.9 10*3/uL (ref 0.1–1.0)
Monocytes Relative: 7.5 % (ref 3.0–12.0)
Neutro Abs: 7.9 10*3/uL — ABNORMAL HIGH (ref 1.4–7.7)
Neutrophils Relative %: 67.1 % (ref 43.0–77.0)
Platelets: 271 10*3/uL (ref 150.0–400.0)
RBC: 4.47 Mil/uL (ref 3.87–5.11)
RDW: 14.3 % (ref 11.5–15.5)
WBC: 11.8 10*3/uL — ABNORMAL HIGH (ref 4.0–10.5)

## 2021-12-27 LAB — HEPATIC FUNCTION PANEL
ALT: 18 U/L (ref 0–35)
AST: 16 U/L (ref 0–37)
Albumin: 4.2 g/dL (ref 3.5–5.2)
Alkaline Phosphatase: 80 U/L (ref 39–117)
Bilirubin, Direct: 0.1 mg/dL (ref 0.0–0.3)
Total Bilirubin: 0.3 mg/dL (ref 0.2–1.2)
Total Protein: 6.7 g/dL (ref 6.0–8.3)

## 2021-12-27 LAB — BASIC METABOLIC PANEL
BUN: 11 mg/dL (ref 6–23)
CO2: 23 mEq/L (ref 19–32)
Calcium: 8.8 mg/dL (ref 8.4–10.5)
Chloride: 106 mEq/L (ref 96–112)
Creatinine, Ser: 0.9 mg/dL (ref 0.40–1.20)
GFR: 77.9 mL/min (ref >60.00)
Glucose, Bld: 83 mg/dL (ref 70–99)
Potassium: 4.2 mEq/L (ref 3.5–5.1)
Sodium: 138 mEq/L (ref 135–145)

## 2021-12-27 LAB — FSH/LH
FSH: 7 m[IU]/mL
LH: 5.7 m[IU]/mL

## 2021-12-27 LAB — LIPID PANEL
Cholesterol: 161 mg/dL (ref 0–200)
HDL: 51.6 mg/dL (ref >39.00)
LDL Cholesterol: 86 mg/dL (ref 0–99)
NonHDL: 109.65
Total CHOL/HDL Ratio: 3
Triglycerides: 119 mg/dL (ref 0.0–149.0)
VLDL: 23.8 mg/dL (ref 0.0–40.0)

## 2021-12-27 LAB — VITAMIN B12: Vitamin B-12: 573 pg/mL (ref 211–911)

## 2021-12-27 LAB — HEMOGLOBIN A1C: Hgb A1c MFr Bld: 6.7 % — ABNORMAL HIGH (ref 4.6–6.5)

## 2021-12-27 LAB — T4, FREE: Free T4: 0.58 ng/dL — ABNORMAL LOW (ref 0.60–1.60)

## 2021-12-27 LAB — PROLACTIN: Prolactin: 7.3 ng/mL

## 2021-12-27 LAB — T3, FREE: T3, Free: 3.2 pg/mL (ref 2.3–4.2)

## 2021-12-30 ENCOUNTER — Encounter: Payer: Self-pay | Admitting: *Deleted

## 2022-01-17 ENCOUNTER — Other Ambulatory Visit: Payer: Self-pay | Admitting: Family Medicine

## 2022-01-24 ENCOUNTER — Other Ambulatory Visit: Payer: Self-pay | Admitting: Family Medicine

## 2022-01-24 NOTE — Telephone Encounter (Signed)
Last office visit 12/26/21 for depression.  Last refilled 08/31/2021 for #180 with 1 refill.  No future appointments.  ?

## 2022-02-22 ENCOUNTER — Encounter: Payer: Self-pay | Admitting: Family Medicine

## 2022-04-03 ENCOUNTER — Encounter: Payer: Self-pay | Admitting: Family Medicine

## 2022-04-03 DIAGNOSIS — N62 Hypertrophy of breast: Secondary | ICD-10-CM

## 2022-04-10 MED ORDER — OZEMPIC (0.25 OR 0.5 MG/DOSE) 2 MG/3ML ~~LOC~~ SOPN: 0.2500 mg | PEN_INJECTOR | SUBCUTANEOUS | 1 refills | Status: DC

## 2022-04-19 ENCOUNTER — Other Ambulatory Visit: Payer: Self-pay | Admitting: Family Medicine

## 2022-04-24 IMAGING — CT CT ABD-PELV W/ CM
2 of 5 series · 17 of 46 positions shown, 19 images · IV contrast (omnipaque)
Comparison: 07/15/2014

CLINICAL DATA: Right lower quadrant abdominal pain

EXAM:
CT ABDOMEN AND PELVIS WITH CONTRAST
TECHNIQUE: Multidetector CT imaging of the abdomen and pelvis was performed
using the standard protocol following bolus administration of
intravenous contrast.
CONTRAST:  85mL OMNIPAQUE IOHEXOL 300 MG/ML  SOLN

[Series 2: abd pel w · axial · 0.77mm/px · z∈[-460,-66]mm · 14 of 89 slices shown, 16 images]
[im 5/89  soft-tissue]
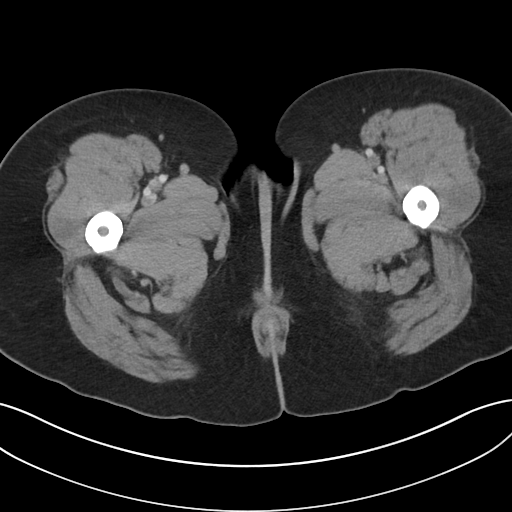
[im 5/89  bone]
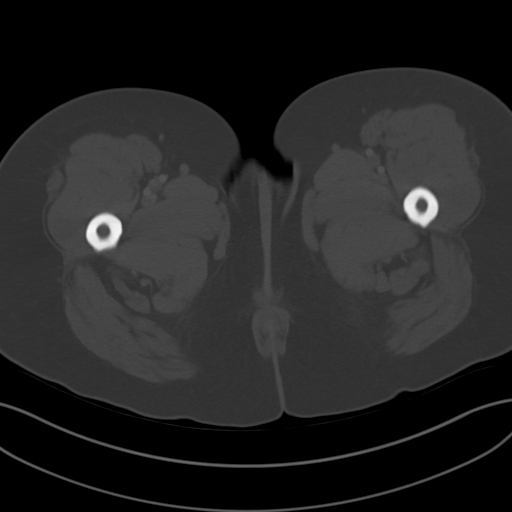
[im 10/89  soft-tissue]
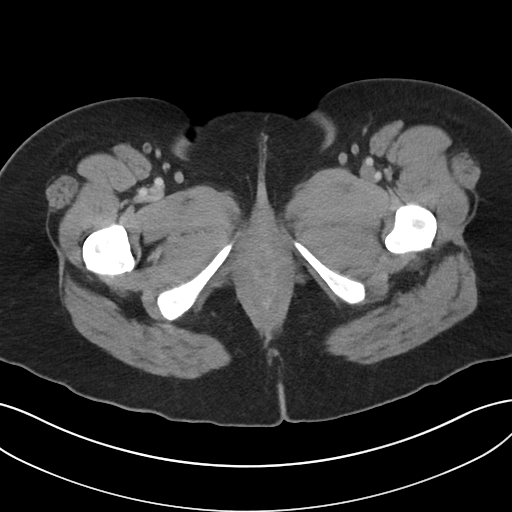
[im 19/89  soft-tissue]
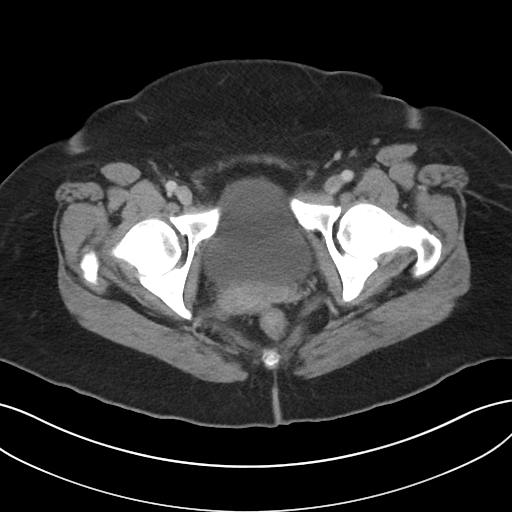
[im 24/89  soft-tissue]
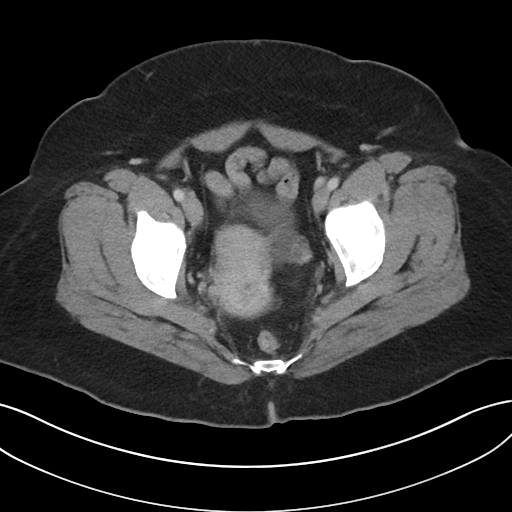
[im 28/89  soft-tissue]
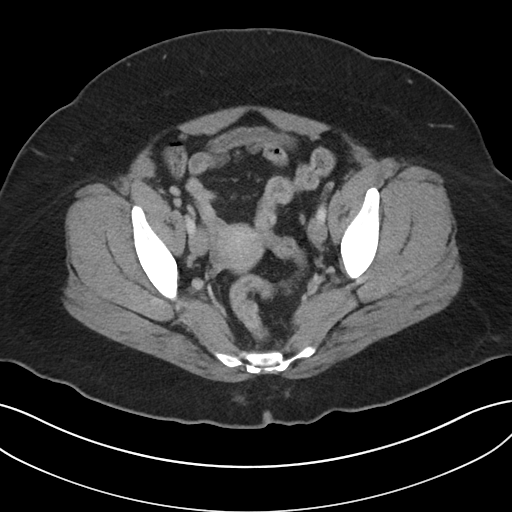
[im 38/89  soft-tissue]
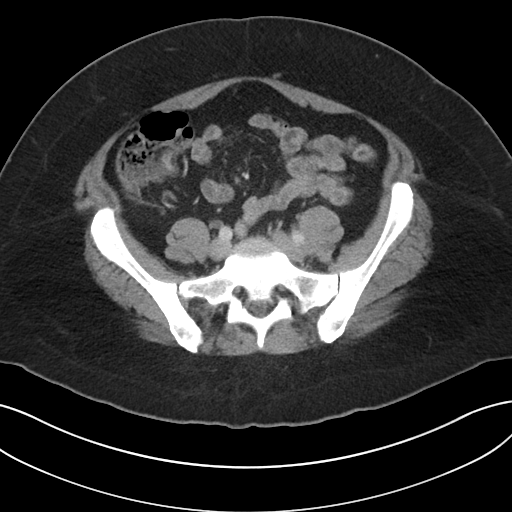
[im 42/89  soft-tissue]
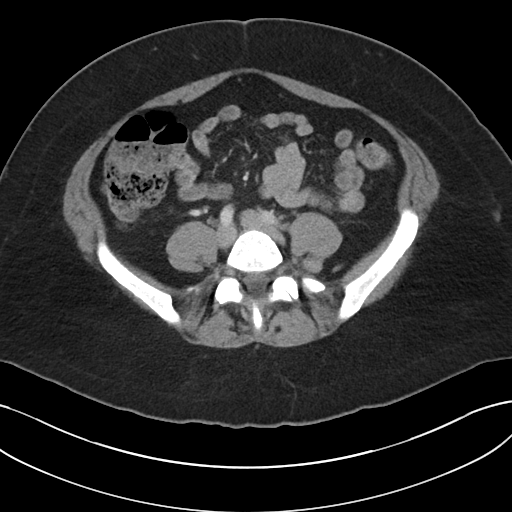
[im 47/89  soft-tissue]
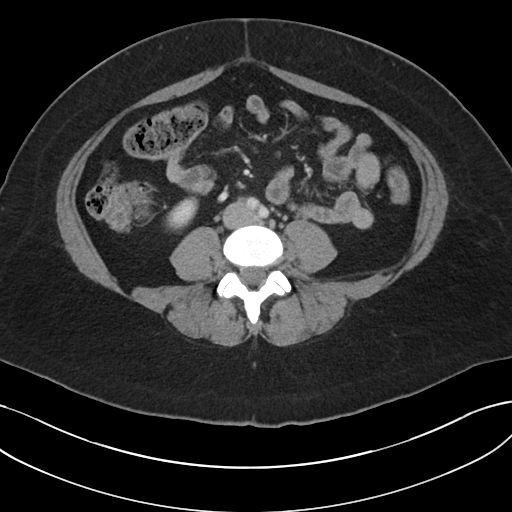
[im 51/89  soft-tissue]
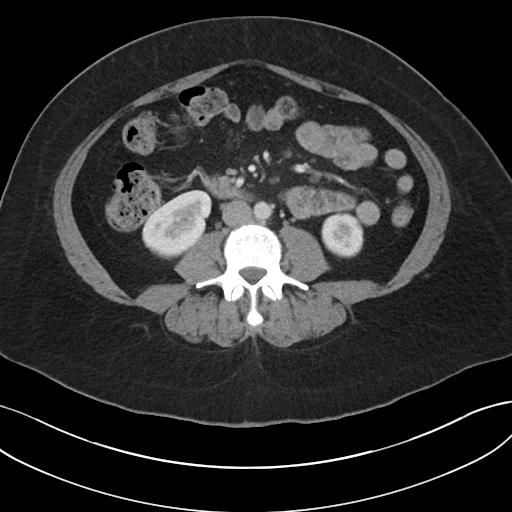
[im 51/89  bone]
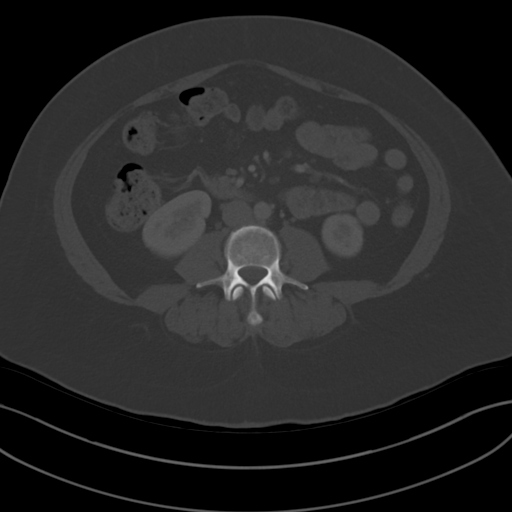
[im 61/89  soft-tissue]
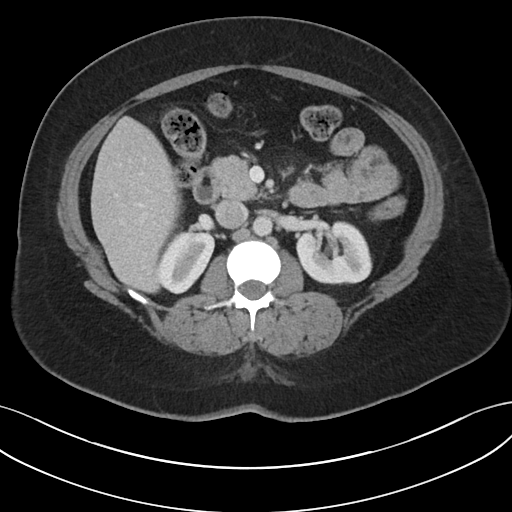
[im 65/89  soft-tissue]
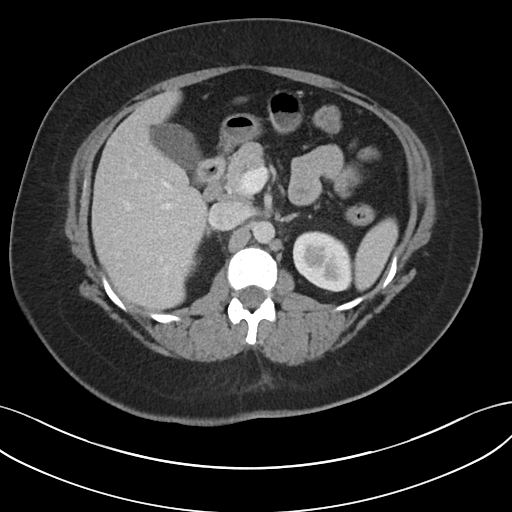
[im 70/89  soft-tissue]
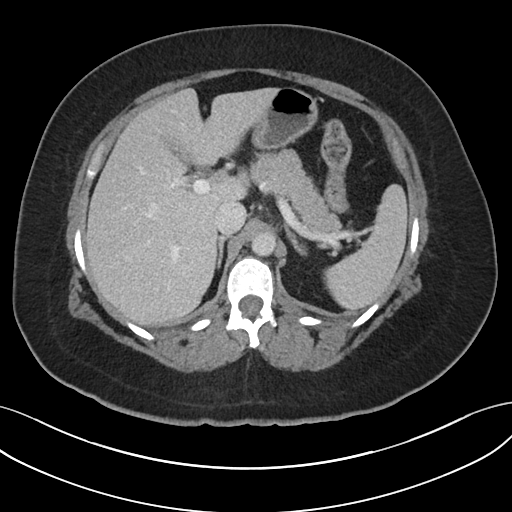
[im 79/89  soft-tissue]
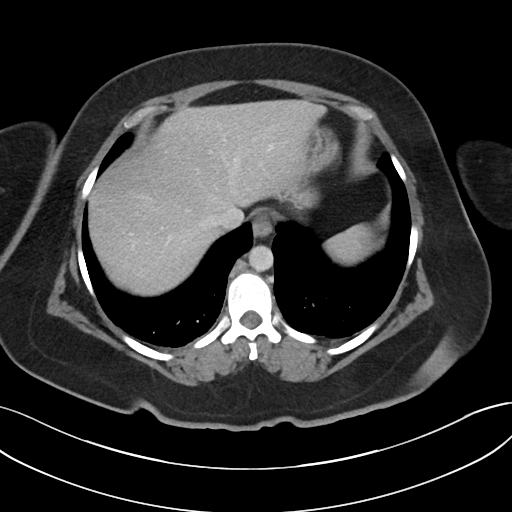
[im 84/89  soft-tissue]
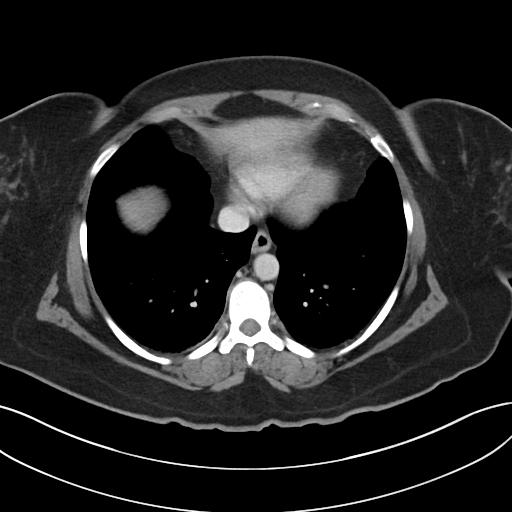

[Series 5: coronal · coronal · 0.75mm/px · 3 of 107 slices shown]
[im 36/107  soft-tissue]
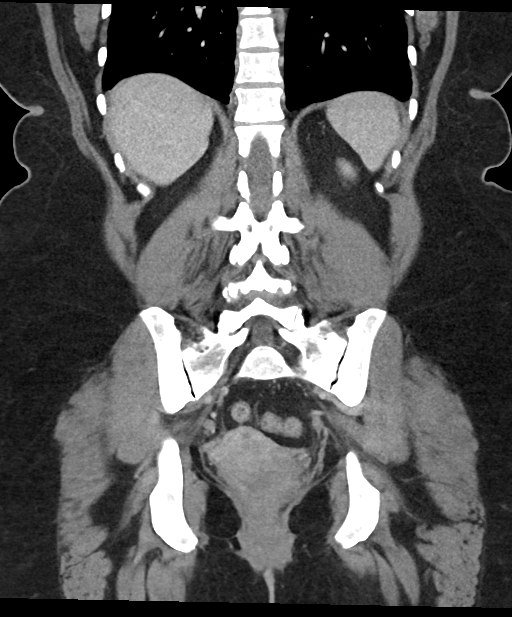
[im 48/107  soft-tissue]
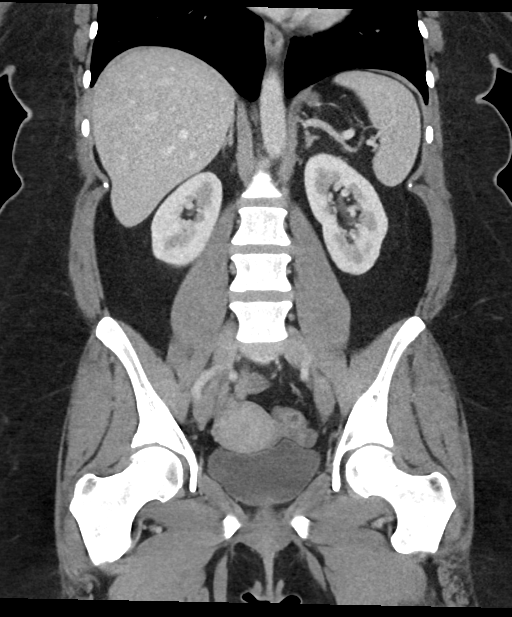
[im 59/107  soft-tissue]
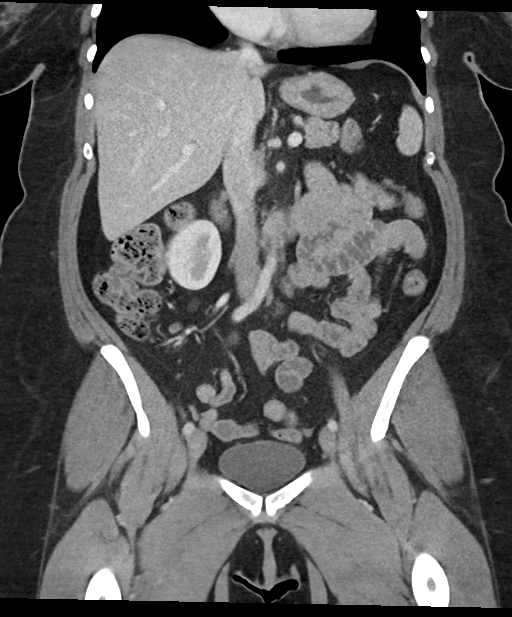

[17 of 46 positions shown; findings below may reference images not displayed]

FINDINGS: Lower chest: Or no acute abnormality.

Hepatobiliary: No focal hepatic abnormality. Gallbladder
unremarkable.

Pancreas: No focal abnormality or ductal dilatation.

Spleen: No focal abnormality.  Normal size.

Adrenals/Urinary Tract: Multiple bilateral nonobstructing renal
stones. No ureteral stones or hydronephrosis. Adrenal glands and
urinary bladder unremarkable.

Stomach/Bowel: Normal appendix. Stomach, large and small bowel
grossly unremarkable.

Vascular/Lymphatic: No evidence of aneurysm or adenopathy.

Reproductive: Uterus and adnexa unremarkable.  No mass.

Other: No free fluid or free air.

Musculoskeletal: No acute bony abnormality.
IMPRESSION: Bilateral nephrolithiasis.  No ureteral stones or hydronephrosis.

Normal appendix.

No acute findings.

## 2022-05-05 ENCOUNTER — Ambulatory Visit (HOSPITAL_BASED_OUTPATIENT_CLINIC_OR_DEPARTMENT_OTHER): Payer: BC Managed Care – PPO | Admitting: Psychiatry

## 2022-05-05 ENCOUNTER — Encounter (HOSPITAL_COMMUNITY): Payer: Self-pay | Admitting: Psychiatry

## 2022-05-05 VITALS — Wt 204.0 lb

## 2022-05-05 DIAGNOSIS — F411 Generalized anxiety disorder: Secondary | ICD-10-CM

## 2022-05-05 DIAGNOSIS — F3341 Major depressive disorder, recurrent, in partial remission: Secondary | ICD-10-CM

## 2022-05-05 DIAGNOSIS — F902 Attention-deficit hyperactivity disorder, combined type: Secondary | ICD-10-CM

## 2022-05-05 MED ORDER — BUSPIRONE HCL 5 MG PO TABS: 5.0000 mg | ORAL_TABLET | Freq: Three times a day (TID) | ORAL | 0 refills | Status: DC

## 2022-05-05 MED ORDER — ATOMOXETINE HCL 60 MG PO CAPS: 60.0000 mg | ORAL_CAPSULE | Freq: Every day | ORAL | 0 refills | Status: DC

## 2022-05-05 NOTE — Progress Notes (Signed)
[facility] Initial Assessment Note  Patient Location: Home Provider Location: Home Office   I connected with Candice Mcgrath by video and verified that I am talking with correct person using two identifiers.   I discussed the limitations, risks, security and privacy concerns of performing an evaluation and management service virtually and the availability of in person appointments. I also discussed with the patient that there may be a patient responsible charge related to this service. The patient expressed understanding and agreed to proceed.  Candice Mcgrath [id number] 46 y.o.  05/05/2022 9:08 AM  Chief Complaint:  I was seen in the behavioral health emergency room in February and then my primary care physician recommended to see a psychiatrist.  I have anxiety.  History of Present Illness:  Patient is 46 year old Caucasian, married, employed female with history of ADHD, anxiety, depression has been follow-up for past 14 years by primary care physician.  Patient recalled having a visit to the behavioral health emergency room in February after having passive suicidal thoughts which she believes triggered by overwhelmed more, maybe dehydration and not sleeping well.  She reported at that time that nobody would care if I were not here.  She did not meet the criteria for inpatient and given BuSpar and referred to primary care physician.  Her primary care physician suggested should establish care with psychiatrist.  Patient reported struggling with anxiety and depression and ADHD for a while.  She had seen psychiatrist in her early age and prescribed Xanax and Adderall and later she started drinking and smoking pot.  In 2007 she has inpatient rehab at [facility].  Since then she denied drinking or using any substances.  She has a sponsor and involved in [employer].  Her medication is managed by primary care physician.  She feels the combination of Lexapro, Strattera  and now BuSpar help lot of her depressive thoughts.  However she still struggle with decreased attention, concentration and some time nervousness and anxiety.  She reported due to summer she has a lot of free time and sometimes she gets impulsive eating, start cleaning the house and she described it happens when she is very nervous and anxious.  She denies any feeling of hopelessness, worthlessness, anhedonia, fatigue, anger, mania, psychosis, delusions, hallucinations, anger, violence, PTSD, panic attacks, current suicidal thoughts or homicidal thoughts.  She was started with Ozempic recently as she diagnosed with diabetes and she had lost 3 pounds since then.  She has a therapist which she sees on and off when she feels that she need to talk to someone.  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  Though she liked her job but reported some time challenging but she enjoying summer time.  She liked crafting, dancing which she does.  She is also excited about upcoming camping trip which she likes.  Patient lives with her husband who is very supportive and they have been married for 7 years.  She has no children.  Her parents live 3 hours away.  So far she is tolerating her medication and reported no side effects tremor or shakes or any EPS.  Her sleep is fair and there are nights when she struggle but denies any crying spells, nightmares, flashbacks.   Past Psychiatric History: History of ADHD, anxiety, depression and drug use.  She saw psychiatrist prescribed Adderall and Xanax and she became addicted to them.  She also smoked pot and drink heavily.  She had  history of rehab in 2007.  As per chart she also had tried Prozac and Wellbutrin but she do not remember very well.  No history of suicidal attempt, psychosis, mania, hallucination, PTSD, panic attacks.  For past 14 years medicines were managed by her primary care physician.  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  Work History; Patient working as a Education officer, museum for past 14 years.  Psychosocial History; Patient born and raised in [place].  She has been married for 7 years.  She has no children.  Her parents lives 3 hours away.  Legal History; Denies any current legal issues.  History Of Abuse; Denies any history of abuse.  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  History of 1 rehab in 2007.  No current use.  She is involved in [employer] and had sponsor.  Neurologic: Headache:  History of headaches. Seizure: No Paresthesias: No   Outpatient Encounter Medications as of 05/05/2022  Medication Sig   albuterol (PROAIR HFA) 108 (90 Base) MCG/ACT inhaler INHALE 2 PUFFS INTO THE LUNGS EVERY 4 (FOUR) HOURS AS NEEDED. USE SPARINGLY   atomoxetine (STRATTERA) 40 MG capsule TAKE 1 CAPSULE(40 MG) BY MOUTH TWICE DAILY   azelastine (ASTELIN) 0.1 % nasal spray Place 1 spray into both nostrils 2 (two) times daily. Use in each nostril as directed   busPIRone (BUSPAR) 5 MG tablet TAKE 1 TABLET BY MOUTH TWICE A DAY   chlorproMAZINE (THORAZINE) 25 MG tablet Take 25 mg by mouth daily as needed.   EPINEPHrine 0.3 mg/0.3 mL IJ SOAJ injection USE UTD PRF SYSTEMATIC REACTION.   escitalopram (LEXAPRO) 20 MG tablet TAKE 1 TABLET BY MOUTH EVERY DAY   fluticasone  (FLONASE) 50 MCG/ACT nasal spray Place 1 spray into both nostrils daily.   hyoscyamine (LEVSIN SL) 0.125 MG SL tablet Place 1 tablet (0.125 mg total) under the tongue every 4 (four) hours as needed.   ketorolac (TORADOL) 10 MG tablet Take 1 tablet (10 mg total) by mouth every 6 (six) hours as needed for moderate pain or severe pain.   levocetirizine (XYZAL) 5 MG tablet SMARTSIG:1 Tablet(s) By Mouth Every Evening   lidocaine-prilocaine (EMLA) cream APPLY TOPICALLY 4 (FOUR) TIMES DAILY AS NEEDED. FOUR TIMES A DAY TO AFFECTED AREA, 1 MONTH SUPPLY   meloxicam (MOBIC) 15 MG tablet TAKE 1 TABLET(15 MG) BY MOUTH DAILY WITH FOOD AS NEEDED FOR PAIN   PAZEO 0.7 % SOLN    Semaglutide,0.25 or 0.'5MG'$ /DOS, (OZEMPIC, 0.25 OR 0.5 MG/DOSE,) 2 MG/3ML SOPN Inject 0.25 mg into the skin once a week.   SUMAtriptan (IMITREX) 100 MG tablet Take by mouth.   tiZANidine (ZANAFLEX)  4 MG tablet TAKE 1 TABLET BY MOUTH AT BEDTIME   triamcinolone cream (KENALOG) 0.1 % Apply 1 application topically 2 (two) times daily.   valACYclovir (VALTREX) 500 MG tablet Take 1 tablet (500 mg total) by mouth 2 (two) times daily. For 3 days during a flare   No facility-administered encounter medications on file as of 05/05/2022.    No results found for this or any previous visit (from the past 2160 hour(s)).    Constitutional:  Wt 204 lb (92.5 kg)   BMI 38.55 kg/m    Musculoskeletal: Strength & Muscle Tone: within normal limits Gait & Station: normal Patient leans: N/A  Psychiatric Specialty Exam: Physical Exam  ROS  Weight 204 lb (92.5 kg).There is no height or weight on file to calculate BMI.  General Appearance: Casual and pleasant  Eye Contact:  Good  Speech:  Clear and Coherent and Normal Rate  Volume:  Normal  Mood:  Anxious  Affect:  Congruent  Thought Process:  Goal Directed  Orientation:  Full (Time, Place, and Person)  Thought Content:  Logical  Suicidal Thoughts:  No  Homicidal Thoughts:  No  Memory:   Immediate;   Good Recent;   Good Remote;   Good  Judgement:  Good  Insight:  Good  Psychomotor Activity:  Normal  Concentration:  Concentration: Good and Attention Span: Good  Recall:  Good  Fund of Knowledge:  Good  Language:  Good  Akathisia:  No  Handed:  Right  AIMS (if indicated):     Assets:  Communication Skills Desire for Improvement Housing Resilience Social Support Talents/Skills Transportation  ADL's:  Intact  Cognition:  WNL  Sleep:   fair     Assessment/Plan:  Patient is 46 year old employed, married female with history of ADHD, anxiety, depression currently on BuSpar 5 mg twice a day, Lexapro 20 mg daily and Strattera 40 mg daily.  I reviewed current medication, blood work results, psychosocial stressors, history.  Patient had symptoms of attention, focus sometime impulsive behavior and anxiety.  So far she is tolerating her medication and without side effects.  I recommend trying going up on BuSpar to take 5 mg 3 times a day and Strattera from '40mg'$  to 60 mg to address the symptoms.  Continue Lexapro 20 mg daily for now.  Patient is involved in [place] meeting and had sponsor that keeping her sober from substance use.  She also have a therapist which she talks as needed basis.  I encouraged to continue since it is helping her.  Discussed medication side effects and benefits.  Discussed safety concerns and anytime having active suicidal thoughts or homicidal halogen need to call 911 or go to local emergency room.  Follow-up in 3 weeks.  [name], MD 05/05/2022    Follow Up Instructions: I discussed the assessment and treatment plan with the patient. The patient was provided an opportunity to ask questions and all were answered. The patient agreed with the plan and demonstrated an understanding of the instructions.   The patient was advised to call back or seek an in-person evaluation if the symptoms worsen or if the condition fails to improve as anticipated.    Collaboration of Care: Primary Care Provider AEB notes are available in epic to review.   Patient/Guardian was advised Release of Information must be obtained prior to any record release in order to collaborate their care with an outside provider. Patient/Guardian was advised if they have not already done so to contact the  registration department to sign all necessary forms in order for [place] to release information regarding their care.    Consent: Patient/Guardian gives verbal consent for treatment and assignment of benefits for services provided during this visit. Patient/Guardian expressed understanding and agreed to proceed.     I provided 57 minutes of non-face-to-face time during this encounter.

## 2022-06-07 ENCOUNTER — Telehealth (HOSPITAL_COMMUNITY): Payer: Self-pay | Admitting: *Deleted

## 2022-06-07 ENCOUNTER — Encounter (HOSPITAL_COMMUNITY): Payer: Self-pay | Admitting: Psychiatry

## 2022-06-07 ENCOUNTER — Telehealth (HOSPITAL_BASED_OUTPATIENT_CLINIC_OR_DEPARTMENT_OTHER): Payer: BC Managed Care – PPO | Admitting: Psychiatry

## 2022-06-07 VITALS — Wt 204.0 lb

## 2022-06-07 DIAGNOSIS — F411 Generalized anxiety disorder: Secondary | ICD-10-CM

## 2022-06-07 DIAGNOSIS — F902 Attention-deficit hyperactivity disorder, combined type: Secondary | ICD-10-CM

## 2022-06-07 DIAGNOSIS — F3341 Major depressive disorder, recurrent, in partial remission: Secondary | ICD-10-CM

## 2022-06-07 MED ORDER — ESCITALOPRAM OXALATE 20 MG PO TABS: 20.0000 mg | ORAL_TABLET | Freq: Every day | ORAL | 0 refills | Status: DC

## 2022-06-07 MED ORDER — ATOMOXETINE HCL 60 MG PO CAPS: 60.0000 mg | ORAL_CAPSULE | Freq: Every day | ORAL | 0 refills | Status: DC

## 2022-06-07 MED ORDER — BUSPIRONE HCL 5 MG PO TABS: 5.0000 mg | ORAL_TABLET | Freq: Three times a day (TID) | ORAL | 0 refills | Status: DC

## 2022-06-07 NOTE — Progress Notes (Signed)
Virtual Visit via Video Note  I connected with Candice Mcgrath on 06/07/22 at  3:20 PM EDT by a video enabled telemedicine application and verified that I am speaking with the correct person using two identifiers.  Location: Patient: Home Provider: Home Office   I discussed the limitations of evaluation and management by telemedicine and the availability of in person appointments. The patient expressed understanding and agreed to proceed.  History of Present Illness: Patient is evaluated by video session.  She is a 46 year old Caucasian, married employed female who was seen first time 3 weeks ago.  She had a history of ADHD, depression and anxiety.  She is taking Lexapro and Strattera from her primary care physician and recently BuSpar was added after having severe anxiety and nervousness.  We also increased the dose of BuSpar on the last visit and augment Strattera as patient feels sometimes decrease in attention concentration.  She has not started 60 mg Strattera because she has left her work and she is waiting her insurance to get approved.  Patient reported school starting tomorrow and she is getting ready to organize herself.  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  She reported once she starts the school then she had a better understanding about her attention and focus.  She is sleeping better since Lexapro taking in the morning.  She feels the higher dose of BuSpar helps her anxiety.  She is sleeping better.  She denies any crying spells or any feeling of hopelessness or worthlessness.  She remains sober and involved in [place] meeting and she had a sponsor.  She does see a therapist on an but she needed.  She lives with her husband.  They have been married for 7 years.  They have no children.  Her parents live 3 hours away.  Patient also wondering if she can try another nonstimulant to help her ADD because sometimes she feels Strattera does cause stomach  issues.  Past Psychiatric History: History of ADHD, anxiety, depression, drug and ETOH use. 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  H/O rehab in 2007.  As per chart tried Prozac and Wellbutrin but no details. No history of suicidal attempt, psychosis, mania, hallucination, PTSD, panic attacks.  For past 14 years medicines were managed by her primary care physician.    Psychiatric Specialty Exam: Physical Exam  Review of Systems  Weight 204 lb (92.5 kg).There is no height or weight on file to calculate BMI.  General Appearance: Casual  Eye Contact:  Good  Speech:  Clear and Coherent  Volume:  Normal  Mood:  Euthymic  Affect:  Appropriate  Thought Process:  Goal Directed  Orientation:  Full (Time, Place, and Person)  Thought Content:  Logical  Suicidal Thoughts:  No  Homicidal Thoughts:  No  Memory:  Immediate;   Good Recent;   Good Remote;   Good  Judgement:  Intact  Insight:  Present  Psychomotor Activity:  Normal  Concentration:  Concentration: Good and Attention Span: Good  Recall:  Good  Fund of Knowledge:  Good  Language:  Good  Akathisia:  No  Handed:  Right  AIMS (if indicated):     Assets:  Communication Skills Desire for Improvement Housing Social Support Talents/Skills Transportation  ADL's:  Intact  Cognition:  WNL  Sleep:   better      Assessment and Plan: Major depressive disorder, recurrent.  Generalized anxiety disorder.  ADHD, combined type.  Patient  liked the increased dose of BuSpar and she is taking Lexapro in the morning which is helping her sleep.  She has not started Strattera 60 mg yet.  She is using left about 40 mg but her plan is to give a try to new dose as school started tomorrow.  She inquired about any other nonstimulant medicine to help her attention since sometimes she noticed that her causes GI symptoms.  We talk about other options like clonidine and even Wellbutrin.  After discussing the risks and benefits patient  decided to stay on Strattera.  She is also taking Thorazine for her headaches which she uses as needed.  Patient like to keep the Lexapro and BuSpar.  She will need a new prescription.  Her Lexapro is given by her PCP but she wants to have the prescription sent to pharmacy from our office.  Continue Lexapro 20 mg daily in the morning, BuSpar 5 mg 3 times a day.  We will provide 1 more refill of Strattera 60 mg however patient will call us back if she wants more refill as patient does not take the Strattera every day.  Recommended to call us back if she is any question or any concern.  Follow-up in 6 weeks.  Follow Up Instructions:    I discussed the assessment and treatment plan with the patient. The patient was provided an opportunity to ask questions and all were answered. The patient agreed with the plan and demonstrated an understanding of the instructions.   The patient was advised to call back or seek an in-person evaluation if the symptoms worsen or if the condition fails to improve as anticipated.  Collaboration of Care: Primary Care Provider AEB notes are available in epic to review.  Patient/Guardian was advised Release of Information must be obtained prior to any record release in order to collaborate their care with an outside provider. Patient/Guardian was advised if they have not already done so to contact the registration department to sign all necessary forms in order for [place] to release information regarding their care.   Consent: Patient/Guardian gives verbal consent for treatment and assignment of benefits for services provided during this visit. Patient/Guardian expressed understanding and agreed to proceed.    I provided 28 minutes of non-face-to-face time during this encounter.   [name], MD

## 2022-06-07 NOTE — Telephone Encounter (Signed)
PA FOR ATOMOXETINE 50 MG CAPSULES SUBMITTED VIA COVER MY MEDS.  PA APPROVED BY [facility].  APPROVAL VALID FROM 06/06/22 THROUGH 06/06/2025.

## 2022-06-09 ENCOUNTER — Other Ambulatory Visit: Payer: Self-pay | Admitting: Family Medicine

## 2022-06-09 NOTE — Telephone Encounter (Signed)
Refill request Tizanidine Last refill 12/07/21 #90/1 Last office visit 12/26/21

## 2022-06-11 ENCOUNTER — Other Ambulatory Visit: Payer: Self-pay | Admitting: Family Medicine

## 2022-06-12 NOTE — Telephone Encounter (Signed)
Refill request Ozempic Last refill 04/10/22   3 ML/1 Last office visit 12/26/21 No upcoming appointment

## 2022-06-13 ENCOUNTER — Telehealth: Payer: Self-pay | Admitting: *Deleted

## 2022-06-13 NOTE — Telephone Encounter (Signed)
Received [facility] from [facility] requesting PA for Ozempic.  PA completed on CoverMyMeds and sent to [facility] for review.  Can take up to 72 hours for a decision.

## 2022-06-14 ENCOUNTER — Encounter: Payer: Self-pay | Admitting: Family Medicine

## 2022-06-14 NOTE — Telephone Encounter (Signed)
I have resubmitted the PA to [facility] for the Ozempic.  They require that we upload A1c results which I included this time in the PA.  Awaiting decision.

## 2022-06-14 NOTE — Telephone Encounter (Signed)
Prior auth for Ozempic (0.25 or 0.5 MG/DOSE) '2MG'$ /3ML pen-injectors has been denied. Candice Mcgrath (KeyEvlyn Courier) Rx #: [id number]  [facility]  received a request from your provider for coverage of Ozempic (semaglutide). The request was denied because: Your plan only covers this drug when A) your A1C is sent to [place], and your test results are in a certain range (D5Z greater than or equal to 6.5 percent), B) your 2-hour plasma glucose (PG) during oral glucose tolerance test (OGTT) is sent to [place], and your results are in a certain range (2-hour PG greater than or equal to 200 mg/dL), C) your random plasma glucose is sent to [place], and your test results are in a certain range (random plasma glucose greater than or equal to 200 mg/dL with symptoms of hyperglycemia (e.g., polyuria, polydipsia, polyphagia) or hyperglycemic crisis), or D) your fasting plasma glucose (FPG) is sent to [place], and your results are in a certain range (FPG greater than or equal to 126 mg/dL). We denied your request because: A) We did not receive your results, or B) Your results were not in the approvable range.   Denial letter sent to scanning.

## 2022-06-15 NOTE — Telephone Encounter (Signed)
2nd PA approved effective from 06/14/22 through 06/14/2025.  Patient notified of approval via [place].

## 2022-07-18 ENCOUNTER — Other Ambulatory Visit (HOSPITAL_COMMUNITY): Payer: Self-pay | Admitting: Psychiatry

## 2022-07-18 DIAGNOSIS — F902 Attention-deficit hyperactivity disorder, combined type: Secondary | ICD-10-CM

## 2022-07-19 ENCOUNTER — Encounter (HOSPITAL_COMMUNITY): Payer: Self-pay | Admitting: Psychiatry

## 2022-07-19 ENCOUNTER — Telehealth (HOSPITAL_COMMUNITY): Payer: BC Managed Care – PPO | Admitting: Psychiatry

## 2022-07-19 ENCOUNTER — Telehealth (HOSPITAL_BASED_OUTPATIENT_CLINIC_OR_DEPARTMENT_OTHER): Payer: BC Managed Care – PPO | Admitting: Psychiatry

## 2022-07-19 DIAGNOSIS — F3341 Major depressive disorder, recurrent, in partial remission: Secondary | ICD-10-CM | POA: Diagnosis not present

## 2022-07-19 DIAGNOSIS — F411 Generalized anxiety disorder: Secondary | ICD-10-CM | POA: Diagnosis not present

## 2022-07-19 DIAGNOSIS — F902 Attention-deficit hyperactivity disorder, combined type: Secondary | ICD-10-CM | POA: Diagnosis not present

## 2022-07-19 MED ORDER — BUSPIRONE HCL 5 MG PO TABS: 5.0000 mg | ORAL_TABLET | Freq: Three times a day (TID) | ORAL | 0 refills | Status: DC

## 2022-07-19 MED ORDER — ATOMOXETINE HCL 100 MG PO CAPS: 100.0000 mg | ORAL_CAPSULE | Freq: Every day | ORAL | 2 refills | Status: DC

## 2022-07-19 MED ORDER — ESCITALOPRAM OXALATE 20 MG PO TABS: 20.0000 mg | ORAL_TABLET | Freq: Every day | ORAL | 0 refills | Status: DC

## 2022-07-19 NOTE — Progress Notes (Signed)
Virtual Visit via Video Note  I connected with Candice Mcgrath on 07/19/22 at  4:20 PM EDT by a video enabled telemedicine application and verified that I am speaking with the correct person using two identifiers.  Location: Patient: Work Provider: Biomedical scientist   I discussed the limitations of evaluation and management by telemedicine and the availability of in person appointments. The patient expressed understanding and agreed to proceed.  History of Present Illness: Patient is evaluated by video session.  She reported first few days or difficult to adjust in the school but lately things are going much better.  Patient told she was actually taking 40 mg Strattera 2 times a day but did not provide the right doses to as.  She had not started 60 mg because it will be reduced the dose.  However she is willing to try higher dose.  She reported when she come back to work at school she had a incident with the assistant possible because she had missed some paperwork and currently she is on observation and she got very upset and did not go to school next day.  However finally she was able to talk to her session possible and things are much better.  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  She feels still struggle sometimes with attention and focus but manageable.  She is also taking the boards but do not have the dates yet but think about next year.  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  He sleeps good.  She denies any panic attack, crying spells or any feeling of hopelessness or worthlessness.  She is in [employer] and had a sponsor who is very supportive.  She had a good support from her husband.  Patient denies any tremors, shakes or any EPS.  She denies any anhedonia or any feeling of hopelessness.  Past Psychiatric History: History of ADHD, anxiety, depression, drug and ETOH use. 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  H/O rehab in 2007.  As per chart tried Prozac and Wellbutrin but no details. No history of suicidal attempt, psychosis, mania, hallucination, PTSD, panic attacks.  For past 14 years medicines were managed by her primary care physician.    Psychiatric Specialty Exam: Physical Exam  Review of Systems  Weight 204 lb (92.5 kg).There is no height or weight on file to calculate BMI.  General Appearance: Casual  Eye Contact:  Good  Speech:  Clear and Coherent  Volume:  Normal  Mood:  Anxious  Affect:  Congruent  Thought Process:  Goal Directed  Orientation:  Full (Time, Place, and Person)  Thought Content:  Logical  Suicidal Thoughts:  No  Homicidal Thoughts:  No  Memory:  Immediate;   Good Recent;   Good Remote;   Good  Judgement:  Intact  Insight:  Present  Psychomotor Activity:  Normal  Concentration:  Concentration: Good and Attention Span: Good  Recall:  Good  Fund of Knowledge:  Good  Language:  Good  Akathisia:  No  Handed:  Right  AIMS (if indicated):     Assets:  Communication Skills Desire for Improvement Housing Social Support Talents/Skills Transportation  ADL's:  Intact  Cognition:  WNL  Sleep:   fair      Assessment and Plan: Major depressive disorder, recurrent.  Generalized anxiety disorder.  ADHD, combined type.  Patient has not started 60 mg Strattera  because she was taking 40 mg 2 times a day which was her previous dose but to as she told that she is only taking 40 mg.  Now she is taking 80 mg and still struggle some time with attention and focus and I recommend to try 100 mg but take 1 pill a day to improve the compliance.  She agreed with the plan.  She also like to keep the Lexapro and BuSpar at current dose because her anxiety is much better.  She will continue BuSpar 5 mg 3 times a day and Lexapro 20 mg daily.  We will augment Strattera from 80 mg to 100 mg a day.  Recommended to call us back if she has any question or any concern.  Patient  like to have a follow-up in 3 months.  Follow Up Instructions:    I discussed the assessment and treatment plan with the patient. The patient was provided an opportunity to ask questions and all were answered. The patient agreed with the plan and demonstrated an understanding of the instructions.   The patient was advised to call back or seek an in-person evaluation if the symptoms worsen or if the condition fails to improve as anticipated.  Collaboration of Care: Primary Care Provider AEB notes are in Epic to review.   Patient/Guardian was advised Release of Information must be obtained prior to any record release in order to collaborate their care with an outside provider. Patient/Guardian was advised if they have not already done so to contact the registration department to sign all necessary forms in order for [place] to release information regarding their care.   Consent: Patient/Guardian gives verbal consent for treatment and assignment of benefits for services provided during this visit. Patient/Guardian expressed understanding and agreed to proceed.    I provided 26 minutes of non-face-to-face time during this encounter.   [name], MD

## 2022-07-20 ENCOUNTER — Telehealth (HOSPITAL_COMMUNITY): Payer: Self-pay | Admitting: Psychiatry

## 2022-07-26 NOTE — Progress Notes (Signed)
Candice Barman T. Nikaela Coyne, MD, [place] at [facility] [place], [address]  Phone: [phone]  FAX: [place] - 46 y.o. female  MRN [id number]  Date of Birth: [birth year]  Date: 07/27/2022  PCP: Owens Loffler, MD  Referral: Owens Loffler, MD  Chief Complaint  Patient presents with   Diabetes   Flank Pain    Right Side    Subjective:   Candice Mcgrath is a 46 y.o. very pleasant female patient with Body mass index is 39.54 kg/m. who presents with the following:  She is here for an acute visit with an ongoing kidney stone, she also is here for diabetic follow-up.  Buspar really is making a difference.  Anxiety is doing much better!  Diabetes Mellitus: Tolerating Medications: yes Compliance with diet: fair, Body mass index is 39.54 kg/m. Exercise: minimal / intermittent Avg blood sugars at home: not checking Foot problems: none Hypoglycemia: none No nausea, vomitting, blurred vision, polyuria.  Lab Results  Component Value Date   HGBA1C 5.9 (A) 07/27/2022   HGBA1C 6.7 (H) 12/26/2021   HGBA1C 6.4 (A) 03/07/2021   Lab Results  Component Value Date   LDLCALC 86 12/26/2021   CREATININE 0.90 12/26/2021    Wt Readings from Last 3 Encounters:  07/27/22 209 lb 4 oz (94.9 kg)  12/26/21 204 lb 6 oz (92.7 kg)  11/13/21 196 lb (88.9 kg)     Review of Systems is noted in the HPI, as appropriate  Objective:   BP 90/70   Pulse (!) 112   Temp 98.2 F (36.8 C) (Oral)   Ht '5\' 1"'$  (1.549 m)   Wt 209 lb 4 oz (94.9 kg)   LMP 07/15/2022   SpO2 97%   BMI 39.54 kg/m   GEN: No acute distress; alert,appropriate. PULM: Breathing comfortably in no respiratory distress PSYCH: Normally interactive.  CV: RRR, no m/g/r   Laboratory and Imaging Data: Results for orders placed or performed in visit on 07/27/22  POCT glycosylated hemoglobin (Hb A1C)  Result Value Ref Range    Hemoglobin A1C 5.9 (A) 4.0 - 5.6 %   HbA1c POC (<> result, manual entry)     HbA1c, POC (prediabetic range)     HbA1c, POC (controlled diabetic range)    POCT Urinalysis Dipstick (Automated)  Result Value Ref Range   Color, UA Yellow    Clarity, UA Clear    Glucose, UA Negative Negative   Bilirubin, UA Negative    Ketones, UA Negative    Spec Grav, UA 1.020 1.010 - 1.025   Blood, UA Negative    pH, UA 6.0 5.0 - 8.0   Protein, UA Negative Negative   Urobilinogen, UA 0.2 0.2 or 1.0 E.U./dL   Nitrite, UA Negative    Leukocytes, UA Negative Negative     Assessment and Plan:     ICD-10-CM   1. Diabetes mellitus type 2, diet-controlled (HCC)  E11.9 POCT glycosylated hemoglobin (Hb A1C)    2. Nephrolithiasis  N20.0     3. Acute right flank pain  R10.9 POCT Urinalysis Dipstick (Automated)    4. Obesity, Class II, BMI 35-39.9  E66.9      DM is stable and in great shape on Ozempic.  She has not lost weight at all, so I think increasing it purely for weight makes sense.   With probable subjective kidney stone (though no blood on UA), cont toradol.  Patient Instructions  For one month, increase the Ozempic to 0.5 mg, then increase to 1 mg    Medication Management during today's office visit: Meds ordered this encounter  Medications   Semaglutide, 1 MG/DOSE, 4 MG/3ML SOPN    Sig: Inject 1 mg as directed once a week.    Dispense:  9 mL    Refill:  3   Medications Discontinued During This Encounter  Medication Reason   atomoxetine (STRATTERA) 100 MG capsule Completed Course   azelastine (ASTELIN) 0.1 % nasal spray Completed Course   fluticasone (FLONASE) 50 MCG/ACT nasal spray Completed Course   OZEMPIC, 0.25 OR 0.5 MG/DOSE, 2 MG/3ML SOPN     Orders placed today for conditions managed today: Orders Placed This Encounter  Procedures   POCT glycosylated hemoglobin (Hb A1C)   POCT Urinalysis Dipstick (Automated)    Disposition: Return in about 6 months (around 01/26/2023)  for Complete physical exam.  Dragon Medical One speech-to-text software was used for transcription in this dictation.  Possible transcriptional errors can occur using Editor, commissioning.   Signed,  Maud Deed. Dashun Borre, MD   Outpatient Encounter Medications as of 07/27/2022  Medication Sig   albuterol (PROAIR HFA) 108 (90 Base) MCG/ACT inhaler INHALE 2 PUFFS INTO THE LUNGS EVERY 4 (FOUR) HOURS AS NEEDED. USE SPARINGLY   atomoxetine (STRATTERA) 40 MG capsule Take by mouth.   atomoxetine (STRATTERA) 60 MG capsule Take 60 mg by mouth every morning.   busPIRone (BUSPAR) 5 MG tablet Take 1 tablet (5 mg total) by mouth 3 (three) times daily.   chlorproMAZINE (THORAZINE) 25 MG tablet Take 25 mg by mouth daily as needed.   EPINEPHrine 0.3 mg/0.3 mL IJ SOAJ injection USE UTD PRF SYSTEMATIC REACTION.   escitalopram (LEXAPRO) 20 MG tablet Take 1 tablet (20 mg total) by mouth daily.   hyoscyamine (LEVSIN SL) 0.125 MG SL tablet Place 1 tablet (0.125 mg total) under the tongue every 4 (four) hours as needed.   ketorolac (TORADOL) 10 MG tablet Take 1 tablet (10 mg total) by mouth every 6 (six) hours as needed for moderate pain or severe pain.   levocetirizine (XYZAL) 5 MG tablet SMARTSIG:1 Tablet(s) By Mouth Every Evening   lidocaine-prilocaine (EMLA) cream APPLY TOPICALLY 4 (FOUR) TIMES DAILY AS NEEDED. FOUR TIMES A DAY TO AFFECTED AREA, 1 MONTH SUPPLY   PAZEO 0.7 % SOLN    Semaglutide, 1 MG/DOSE, 4 MG/3ML SOPN Inject 1 mg as directed once a week.   SUMAtriptan (IMITREX) 100 MG tablet Take by mouth.   tiZANidine (ZANAFLEX) 4 MG tablet TAKE 1 TABLET BY MOUTH EVERYDAY AT BEDTIME   triamcinolone cream (KENALOG) 0.1 % Apply 1 application topically 2 (two) times daily.   valACYclovir (VALTREX) 500 MG tablet Take 1 tablet (500 mg total) by mouth 2 (two) times daily. For 3 days during a flare   [DISCONTINUED] OZEMPIC, 0.25 OR 0.5 MG/DOSE, 2 MG/3ML SOPN INJECT 0.'25MG'$  INTO THE SKIN ONE TIME PER WEEK   [DISCONTINUED]  atomoxetine (STRATTERA) 100 MG capsule Take 1 capsule (100 mg total) by mouth daily.   [DISCONTINUED] azelastine (ASTELIN) 0.1 % nasal spray Place 1 spray into both nostrils 2 (two) times daily. Use in each nostril as directed   [DISCONTINUED] fluticasone (FLONASE) 50 MCG/ACT nasal spray Place 1 spray into both nostrils daily.   No facility-administered encounter medications on file as of 07/27/2022.

## 2022-07-27 ENCOUNTER — Encounter: Payer: Self-pay | Admitting: Family Medicine

## 2022-07-27 ENCOUNTER — Ambulatory Visit: Payer: BC Managed Care – PPO | Admitting: Family Medicine

## 2022-07-27 VITALS — BP 90/70 | HR 112 | Temp 98.2°F | Ht 61.0 in | Wt 209.2 lb

## 2022-07-27 DIAGNOSIS — R109 Unspecified abdominal pain: Secondary | ICD-10-CM | POA: Diagnosis not present

## 2022-07-27 DIAGNOSIS — N200 Calculus of kidney: Secondary | ICD-10-CM | POA: Diagnosis not present

## 2022-07-27 DIAGNOSIS — E669 Obesity, unspecified: Secondary | ICD-10-CM

## 2022-07-27 DIAGNOSIS — E119 Type 2 diabetes mellitus without complications: Secondary | ICD-10-CM | POA: Diagnosis not present

## 2022-07-27 LAB — POC URINALSYSI DIPSTICK (AUTOMATED)
Bilirubin, UA: NEGATIVE
Blood, UA: NEGATIVE
Glucose, UA: NEGATIVE
Ketones, UA: NEGATIVE
Leukocytes, UA: NEGATIVE
Nitrite, UA: NEGATIVE
Protein, UA: NEGATIVE
Spec Grav, UA: 1.02 (ref 1.010–1.025)
Urobilinogen, UA: 0.2 E.U./dL
pH, UA: 6 (ref 5.0–8.0)

## 2022-07-27 LAB — POCT GLYCOSYLATED HEMOGLOBIN (HGB A1C): Hemoglobin A1C: 5.9 % — AB (ref 4.0–5.6)

## 2022-07-27 MED ORDER — SEMAGLUTIDE (1 MG/DOSE) 4 MG/3ML ~~LOC~~ SOPN: 1.0000 mg | PEN_INJECTOR | SUBCUTANEOUS | 3 refills | Status: DC

## 2022-07-27 NOTE — Patient Instructions (Signed)
For one month, increase the Ozempic to 0.5 mg, then increase to 1 mg

## 2022-07-28 ENCOUNTER — Encounter: Payer: Self-pay | Admitting: Family Medicine

## 2022-08-20 ENCOUNTER — Other Ambulatory Visit (HOSPITAL_COMMUNITY): Payer: Self-pay | Admitting: Psychiatry

## 2022-08-20 DIAGNOSIS — F902 Attention-deficit hyperactivity disorder, combined type: Secondary | ICD-10-CM

## 2022-09-06 ENCOUNTER — Other Ambulatory Visit (HOSPITAL_COMMUNITY): Payer: Self-pay | Admitting: Psychiatry

## 2022-09-06 DIAGNOSIS — F902 Attention-deficit hyperactivity disorder, combined type: Secondary | ICD-10-CM

## 2022-09-07 ENCOUNTER — Telehealth (INDEPENDENT_AMBULATORY_CARE_PROVIDER_SITE_OTHER): Payer: BC Managed Care – PPO | Admitting: Family Medicine

## 2022-09-07 ENCOUNTER — Encounter: Payer: Self-pay | Admitting: Family Medicine

## 2022-09-07 VITALS — Ht 61.0 in

## 2022-09-07 DIAGNOSIS — Z720 Tobacco use: Secondary | ICD-10-CM

## 2022-09-07 DIAGNOSIS — Z716 Tobacco abuse counseling: Secondary | ICD-10-CM

## 2022-09-07 MED ORDER — SEMAGLUTIDE (2 MG/DOSE) 8 MG/3ML ~~LOC~~ SOPN: 2.0000 mg | PEN_INJECTOR | SUBCUTANEOUS | 1 refills | Status: DC

## 2022-09-07 NOTE — Progress Notes (Signed)
    Candice Mcgrath T. Sharmel Ballantine, MD, [place] at [facility] [place], [address]  Phone: [phone]  FAX: [place] - 46 y.o. female  MRN [id number]  Date of Birth: 08/25/1976  Date: 09/07/2022  PCP: Owens Loffler, MD  Referral: Owens Loffler, MD  Chief Complaint  Patient presents with   Nicotine Dependence   Virtual Visit via Video Note:  I connected with  Candice Mcgrath on 09/07/2022  3:20 PM EST by a video enabled telemedicine application and verified that I am speaking with the correct person using two identifiers.   Location patient: home computer, tablet, or smartphone Location provider: work or home office Consent: Verbal consent directly obtained from [facility]. Persons participating in the virtual visit: patient, provider  I discussed the limitations of evaluation and management by telemedicine and the availability of in person appointments. The patient expressed understanding and agreed to proceed.  Chief Complaint  Patient presents with   Nicotine Dependence    History of Present Illness:  Patient presents for smoking cessation discussion.  This is required in part by her health insurance.  She has quit smoking in the past, but she did resume and is now regularly smoking.  She understands that this is not healthy at all, she wants to quit, she is not ready yet.  She has had a lot of psychosocial stressors recently, but her plan is to quit smoking in 2024.  She has lost about 8 pounds with recent [place].  Review of Systems as above: See pertinent positives and pertinent negatives per HPI No acute distress verbally   Observations/Objective/Exam:  An attempt was made to discern vital signs over the phone and per patient if applicable and possible.   General:    Alert, Oriented, appears well and in no acute distress  Pulmonary:     On inspection no  signs of respiratory distress.  Psych / Neurological:     Pleasant and cooperative.  Assessment and Plan:    ICD-10-CM   1. Encounter for smoking cessation counseling  Z71.6     2. Tobacco abuse  Z72.0      Patient is thinking about quitting smoking, she has great motivation to quit next year.  She understands that tobacco is not healthy at all.  I am going to start her on a higher dose of Ozempic next month.  She has well-controlled diabetes.  I discussed the assessment and treatment plan with the patient. The patient was provided an opportunity to ask questions and all were answered. The patient agreed with the plan and demonstrated an understanding of the instructions.   The patient was advised to call back or seek an in-person evaluation if the symptoms worsen or if the condition fails to improve as anticipated.  Follow-up: prn unless noted otherwise below No follow-ups on file.  Meds ordered this encounter  Medications   Semaglutide, 2 MG/DOSE, 8 MG/3ML SOPN    Sig: Inject 2 mg as directed once a week.    Dispense:  9 mL    Refill:  1    For future refills of Ozempic   No orders of the defined types were placed in this encounter.   Signed,  Maud Deed. Khalib Fendley, MD

## 2022-09-28 ENCOUNTER — Telehealth: Payer: Self-pay | Admitting: Family Medicine

## 2022-09-28 NOTE — Telephone Encounter (Signed)
Pt called stating she's been having a cold for 5 days (since 09/24/22). Pt stated she had rsv two weeks ago, tested neg for flu & covid. Pt stated she visited a UC on Sunday, 09/24/22 & was prescribed "prednisone '20mg'$ , twice daily for 5 days" & was also prescribed "amoxicillin 875-125" for a ear infection. Pt stated she hasn't been feeling better at all & is asking Dr. Lorelei Pont for advice? Pt is asking should she be seen by him or be prescribed some stronger meds? Call back # [phone]

## 2022-09-28 NOTE — Telephone Encounter (Signed)
Candice Mcgrath notified as instructed by telephone.  Appointment scheduled on 09/29/22 at 10:15 am with Dr. Silvio Pate.

## 2022-09-29 ENCOUNTER — Encounter: Payer: Self-pay | Admitting: Internal Medicine

## 2022-09-29 ENCOUNTER — Ambulatory Visit (INDEPENDENT_AMBULATORY_CARE_PROVIDER_SITE_OTHER)
Admission: RE | Admit: 2022-09-29 | Discharge: 2022-09-29 | Disposition: A | Discharge status: Home / Self Care | Payer: BC Managed Care – PPO | Source: Ambulatory Visit | Attending: Internal Medicine | Admitting: Internal Medicine

## 2022-09-29 ENCOUNTER — Ambulatory Visit: Payer: BC Managed Care – PPO | Admitting: Internal Medicine

## 2022-09-29 VITALS — BP 102/70 | HR 78 | Temp 97.9°F | Ht 61.0 in | Wt 196.0 lb

## 2022-09-29 DIAGNOSIS — R053 Chronic cough: Secondary | ICD-10-CM | POA: Insufficient documentation

## 2022-09-29 MED ORDER — PREDNISONE 20 MG PO TABS: 40.0000 mg | ORAL_TABLET | Freq: Every day | ORAL | 0 refills | Status: DC

## 2022-09-29 NOTE — Progress Notes (Signed)
Subjective:    Patient ID: Candice Mcgrath, female    DOB: Apr 11, 1976, 46 y.o.   MRN: [id number]  HPI Here due to another respiratory illness  Had likely RSV a while ago--3 weeks ago. 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 Stayed home and started feeling better last week and returned to school  A week ago--went to brunch and may have been exposed to flu (since husband sick and flu positive) Went the next day to urgent care---no testing but got prednisone and augmentin  Still feels it in her chest Frequent cough Slight SOB Out of work again Does have exercise asthma--has used albuterol and it helps some  Has tessalon--not very helpful Able to sleep  Current Outpatient Medications on File Prior to Visit  Medication Sig Dispense Refill   albuterol (PROAIR HFA) 108 (90 Base) MCG/ACT inhaler INHALE 2 PUFFS INTO THE LUNGS EVERY 4 (FOUR) HOURS AS NEEDED. USE SPARINGLY 8.5 each 4   atomoxetine (STRATTERA) 40 MG capsule Take by mouth.     atomoxetine (STRATTERA) 60 MG capsule Take 1 capsule (60 mg total) by mouth daily. 30 capsule 0   busPIRone (BUSPAR) 5 MG tablet Take 1 tablet (5 mg total) by mouth 3 (three) times daily. 270 tablet 0   chlorproMAZINE (THORAZINE) 25 MG tablet Take 25 mg by mouth daily as needed.     EPINEPHrine 0.3 mg/0.3 mL IJ SOAJ injection USE UTD PRF SYSTEMATIC REACTION.  1   escitalopram (LEXAPRO) 20 MG tablet Take 1 tablet (20 mg total) by mouth daily. 90 tablet 0   hyoscyamine (LEVSIN SL) 0.125 MG SL tablet Place 1 tablet (0.125 mg total) under the tongue every 4 (four) hours as needed. 30 tablet 11   ketorolac (TORADOL) 10 MG tablet Take 1 tablet (10 mg total) by mouth every 6 (six) hours as needed for moderate pain or severe pain. 40 tablet 0   levocetirizine (XYZAL) 5 MG tablet SMARTSIG:1 Tablet(s) By Mouth Every Evening     lidocaine-prilocaine (EMLA) cream APPLY TOPICALLY 4 (FOUR) TIMES DAILY AS NEEDED.  FOUR TIMES A DAY TO AFFECTED AREA, 1 MONTH SUPPLY 30 g 2   PAZEO 0.7 % SOLN   3   Semaglutide, 1 MG/DOSE, 4 MG/3ML SOPN Inject 1 mg as directed once a week. 9 mL 3   Semaglutide, 2 MG/DOSE, 8 MG/3ML SOPN Inject 2 mg as directed once a week. 9 mL 1   SUMAtriptan (IMITREX) 100 MG tablet Take by mouth.     tiZANidine (ZANAFLEX) 4 MG tablet TAKE 1 TABLET BY MOUTH EVERYDAY AT BEDTIME 90 tablet 1   triamcinolone cream (KENALOG) 0.1 % Apply 1 application topically 2 (two) times daily. 30 g 0   valACYclovir (VALTREX) 500 MG tablet Take 1 tablet (500 mg total) by mouth 2 (two) times daily. For 3 days during a flare 30 tablet 5   No current facility-administered medications on file prior to visit.    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[place])    adderrall, Xanax, MJ, others, no IV   Hyperlipidemia    IBS (irritable bowel syndrome)    Migraine headache 12/24/2012  Nephrolithiasis     Past Surgical History:  Procedure Laterality Date   Basket Surgery  2005, 06   kidney stones   TYMPANOSTOMY TUBE PLACEMENT Bilateral    as a child    Family History  Problem Relation Age of Onset   Stroke Maternal Grandmother    Cancer Maternal Grandfather    Heart disease Paternal Grandfather    Alcohol abuse Other        GP   Breast cancer Cousin    Stroke Other        GGM    Social History   Socioeconomic History   Marital status: Married    Spouse name: Not on file   Number of children: Not on file   Years of education: Not on file   Highest education level: Not on file  Occupational History   Occupation: spanish    Employer: [employer]    Comment: @ [employer]  Tobacco Use   Smoking status: Every Day     Packs/day: 0.50    Types: Cigarettes    Last attempt to quit: 06/24/2016    Years since quitting: 6.2   Smokeless tobacco: Former    Quit date: 12/04/2008  Substance and Sexual Activity   Alcohol use: Not Currently    Alcohol/week: 2.0 - 4.0 standard drinks of alcohol    Types: 2 - 4 Cans of beer per week    Comment: recovering AA   Drug use: No    Comment: Recovering NA   Sexual activity: Not on file  Other Topics Concern   Not on file  Social History Narrative   Not on file   Social Determinants of Health   Financial Resource Strain: Not on file  Food Insecurity: Not on file  Transportation Needs: Not on file  Physical Activity: Not on file  Stress: Not on file  Social Connections: Not on file  Intimate Partner Violence: Not on file   Review of Systems No N/V Some diarrhea Eating is off--and has lost a little weight    Objective:   Physical Exam Constitutional:      Appearance: Normal appearance.  HENT:     Right Ear: Tympanic membrane and ear canal normal.     Left Ear: Tympanic membrane and ear canal normal.     Mouth/Throat:     Pharynx: No oropharyngeal exudate or posterior oropharyngeal erythema.  Pulmonary:     Effort: Pulmonary effort is normal.     Breath sounds: No rales.     Comments: Fair air movement Slightly prolonged expiratory phase but no wheezing Musculoskeletal:     Cervical back: Neck supple.  Lymphadenopathy:     Cervical: No cervical adenopathy.  Neurological:     Mental Status: She is alert.            Assessment & Plan:

## 2022-09-29 NOTE — Assessment & Plan Note (Addendum)
Likely repeated infections--RSV and no attentuated flu Has component of bronchospasm---advised using the albuterol Not sure prednisone helped but it might have Check CXR to make sure no discrete infiltrate  Radiologist calls some right base opacity---is still finishing out the augmentin If worsens next week--would treat with z-pak 6 days of prednisone and regular albuterol Will set up repeat CXR in 1 month or so

## 2022-10-01 ENCOUNTER — Other Ambulatory Visit (HOSPITAL_COMMUNITY): Payer: Self-pay | Admitting: Psychiatry

## 2022-10-01 DIAGNOSIS — F902 Attention-deficit hyperactivity disorder, combined type: Secondary | ICD-10-CM

## 2022-10-09 ENCOUNTER — Telehealth (HOSPITAL_BASED_OUTPATIENT_CLINIC_OR_DEPARTMENT_OTHER): Payer: BC Managed Care – PPO | Admitting: Psychiatry

## 2022-10-09 ENCOUNTER — Encounter (HOSPITAL_COMMUNITY): Payer: Self-pay | Admitting: Psychiatry

## 2022-10-09 DIAGNOSIS — F411 Generalized anxiety disorder: Secondary | ICD-10-CM | POA: Diagnosis not present

## 2022-10-09 DIAGNOSIS — F902 Attention-deficit hyperactivity disorder, combined type: Secondary | ICD-10-CM

## 2022-10-09 DIAGNOSIS — F3341 Major depressive disorder, recurrent, in partial remission: Secondary | ICD-10-CM | POA: Diagnosis not present

## 2022-10-09 MED ORDER — BUSPIRONE HCL 5 MG PO TABS: 5.0000 mg | ORAL_TABLET | Freq: Two times a day (BID) | ORAL | 0 refills | Status: DC

## 2022-10-09 MED ORDER — ESCITALOPRAM OXALATE 20 MG PO TABS: 20.0000 mg | ORAL_TABLET | Freq: Every day | ORAL | 0 refills | Status: DC

## 2022-10-09 MED ORDER — ATOMOXETINE HCL 60 MG PO CAPS: 60.0000 mg | ORAL_CAPSULE | Freq: Every day | ORAL | 0 refills | Status: DC

## 2022-10-09 NOTE — Progress Notes (Signed)
Virtual Visit via Video Note  I connected with Candice Mcgrath on 10/09/22 at  2:40 PM EST by a video enabled telemedicine application and verified that I am speaking with the correct person using two identifiers.  Location: Patient: In Car Provider: Home Office   I discussed the limitations of evaluation and management by telemedicine and the availability of in person appointments. The patient expressed understanding and agreed to proceed.  History of Present Illness: Patient is evaluated by video session.  She reported has been very sick with RSV and finally she is getting better.  She is taking Strattera 100 mg however sometimes she tends to forget 40 mg at night.  She is sad because her brother recently relapsed into drinking.  She is tolerating her medication and denies any tremors, shakes or any EPS.  She noticed since adjusted dose of Strattera her focus, attention and concentration is much improved.  She is a Education officer, museum for [employer].  She admitted very tired lately because of infection but now slowly and gradually getting back to her normal energy.  Her plan is to take the boards but has not establish the date yet.  She denies any major panic attack.  She is in [place] meeting and her sponsor is very supportive.  She denies any feeling of hopelessness or worthlessness.  She denies any suicidal thoughts.  She still sometimes feels overwhelmed but denies any major panic attack.  She wants to keep the current medication.  Her 40 mg is given by Silvestre Mesi.   Past Psychiatric History: History of ADHD, anxiety, depression, drug and ETOH use. 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  H/O rehab in 2007.  As per chart tried Prozac and Wellbutrin but no details. No history of suicidal attempt, psychosis, mania, hallucination, PTSD, panic attacks.  For past 14 years medicines were managed by her primary care physician.   Recent Results (from the past 2160  hour(s))  POCT glycosylated hemoglobin (Hb A1C)     Status: Abnormal   Collection Time: 07/27/22  3:17 PM  Result Value Ref Range   Hemoglobin A1C 5.9 (A) 4.0 - 5.6 %   HbA1c POC (<> result, manual entry)     HbA1c, POC (prediabetic range)     HbA1c, POC (controlled diabetic range)    POCT Urinalysis Dipstick (Automated)     Status: Normal   Collection Time: 07/27/22  3:25 PM  Result Value Ref Range   Color, UA Yellow    Clarity, UA Clear    Glucose, UA Negative Negative   Bilirubin, UA Negative    Ketones, UA Negative    Spec Grav, UA 1.020 1.010 - 1.025   Blood, UA Negative    pH, UA 6.0 5.0 - 8.0   Protein, UA Negative Negative   Urobilinogen, UA 0.2 0.2 or 1.0 E.U./dL   Nitrite, UA Negative    Leukocytes, UA Negative Negative     Psychiatric Specialty Exam: Physical Exam  Review of Systems  Weight 192 lb (87.1 kg).There is no height or weight on file to calculate BMI.  General Appearance: Casual  Eye Contact:  Good  Speech:  Clear and Coherent  Volume:  Normal  Mood:  Anxious  Affect:  Appropriate  Thought Process:  Goal Directed  Orientation:  Full (Time, Place, and Person)  Thought Content:  Logical  Suicidal Thoughts:  No  Homicidal Thoughts:  No  Memory:  Immediate;   Fair Recent;  Good Remote;   Good  Judgement:  Good  Insight:  Good  Psychomotor Activity:  Normal  Concentration:  Concentration: Good and Attention Span: Good  Recall:  Good  Fund of Knowledge:  Good  Language:  Good  Akathisia:  No  Handed:  Right  AIMS (if indicated):     Assets:  Communication Skills Desire for Improvement Housing Social Support Talents/Skills Transportation  ADL's:  Intact  Cognition:  WNL  Sleep:   now better     Assessment and Plan: Major depressive disorder, recurrent.  Generalized anxiety disorder.  ADHD, combined type.  Patient recently had RSV infection and slowly and gradually getting better.  Discussed about whether relapse into drinking.   Patient is aware that unless he does not want to get help she cannot force him to get treatment.  I encouraged to continue to work with the brother and provide resources.  She like to keep the current medication however cut down her BuSpar only taking 2 times a day because she feels her anxiety is better with twice a day and sometimes she forgets the BuSpar.  Continue Lexapro 20 mg daily, we will reduce BuSpar 5 mg 2 times a day as patient taking 2 times a day.  Continue Strattera 60 mg in the morning and she also takes 40 mg prescribed by Silvestre Mesi at night.  I recommend in the future should get the Strattera from 1 office and she agreed to give [place] a call when she is running low on Strattera 40 mg.  Recommended to call us back if she has any question or any concern.  Follow-up in 3 months.  Follow Up Instructions:    I discussed the assessment and treatment plan with the patient. The patient was provided an opportunity to ask questions and all were answered. The patient agreed with the plan and demonstrated an understanding of the instructions.   The patient was advised to call back or seek an in-person evaluation if the symptoms worsen or if the condition fails to improve as anticipated.  Collaboration of Care: Other provider involved in patient's care AEB notes are available in epic to review.  Patient/Guardian was advised Release of Information must be obtained prior to any record release in order to collaborate their care with an outside provider. Patient/Guardian was advised if they have not already done so to contact the registration department to sign all necessary forms in order for [place] to release information regarding their care.   Consent: Patient/Guardian gives verbal consent for treatment and assignment of benefits for services provided during this visit. Patient/Guardian expressed understanding and agreed to proceed.    I provided 20 minutes of non-face-to-face time during this  encounter.   [name], MD

## 2022-10-30 ENCOUNTER — Ambulatory Visit (INDEPENDENT_AMBULATORY_CARE_PROVIDER_SITE_OTHER)
Admission: RE | Admit: 2022-10-30 | Discharge: 2022-10-30 | Disposition: A | Discharge status: Home / Self Care | Payer: BC Managed Care – PPO | Source: Ambulatory Visit | Attending: Internal Medicine | Admitting: Internal Medicine

## 2022-10-30 ENCOUNTER — Encounter: Payer: Self-pay | Admitting: Internal Medicine

## 2022-10-30 ENCOUNTER — Ambulatory Visit: Payer: BC Managed Care – PPO | Admitting: Internal Medicine

## 2022-10-30 VITALS — BP 104/70 | HR 90 | Temp 97.8°F | Ht 61.0 in | Wt 191.0 lb

## 2022-10-30 DIAGNOSIS — R053 Chronic cough: Secondary | ICD-10-CM

## 2022-10-30 DIAGNOSIS — R21 Rash and other nonspecific skin eruption: Secondary | ICD-10-CM | POA: Diagnosis not present

## 2022-10-30 NOTE — Assessment & Plan Note (Signed)
I suspect this is contact to the acetone Discussed moisturizers, etc Triamcinolone cream prn

## 2022-10-30 NOTE — Progress Notes (Signed)
Subjective:    Patient ID: Candice Mcgrath, female    DOB: [birth year], 47 y.o.   MRN: [id number]  HPI Here for follow up of repeated infections and cough  Feels mostly better Wearing mask to avoid another infection No cough Finished out the augmentin after the last visit Took another week before she wasn't exhausted after work Then finally back to normal  Breathing is back to normal  Having very dry skin in fingers Just next to nails Seems to be worse after using acetone to remove polish Washes her hands a lot  Current Outpatient Medications on File Prior to Visit  Medication Sig Dispense Refill   albuterol (PROAIR HFA) 108 (90 Base) MCG/ACT inhaler INHALE 2 PUFFS INTO THE LUNGS EVERY 4 (FOUR) HOURS AS NEEDED. USE SPARINGLY 8.5 each 4   atomoxetine (STRATTERA) 40 MG capsule Take by mouth.     atomoxetine (STRATTERA) 60 MG capsule Take 1 capsule (60 mg total) by mouth daily. 30 capsule 0   busPIRone (BUSPAR) 5 MG tablet Take 1 tablet (5 mg total) by mouth 2 (two) times daily. 180 tablet 0   chlorproMAZINE (THORAZINE) 25 MG tablet Take 25 mg by mouth daily as needed.     EPINEPHrine 0.3 mg/0.3 mL IJ SOAJ injection USE UTD PRF SYSTEMATIC REACTION.  1   escitalopram (LEXAPRO) 20 MG tablet Take 1 tablet (20 mg total) by mouth daily. 90 tablet 0   hyoscyamine (LEVSIN SL) 0.125 MG SL tablet Place 1 tablet (0.125 mg total) under the tongue every 4 (four) hours as needed. 30 tablet 11   ketorolac (TORADOL) 10 MG tablet Take 1 tablet (10 mg total) by mouth every 6 (six) hours as needed for moderate pain or severe pain. 40 tablet 0   levocetirizine (XYZAL) 5 MG tablet SMARTSIG:1 Tablet(s) By Mouth Every Evening     lidocaine-prilocaine (EMLA) cream APPLY TOPICALLY 4 (FOUR) TIMES DAILY AS NEEDED. FOUR TIMES A DAY TO AFFECTED AREA, 1 MONTH SUPPLY 30 g 2   PAZEO 0.7 % SOLN   3   Semaglutide, 1 MG/DOSE, 4 MG/3ML SOPN Inject 1 mg as directed once a week. 9 mL 3   Semaglutide, 2 MG/DOSE, 8  MG/3ML SOPN Inject 2 mg as directed once a week. 9 mL 1   SUMAtriptan (IMITREX) 100 MG tablet Take by mouth.     tiZANidine (ZANAFLEX) 4 MG tablet TAKE 1 TABLET BY MOUTH EVERYDAY AT BEDTIME 90 tablet 1   triamcinolone cream (KENALOG) 0.1 % Apply 1 application topically 2 (two) times daily. 30 g 0   valACYclovir (VALTREX) 500 MG tablet Take 1 tablet (500 mg total) by mouth 2 (two) times daily. For 3 days during a flare 30 tablet 5   No current facility-administered medications on file prior to visit.    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[place])    adderrall, Xanax, MJ, others, no IV   Hyperlipidemia    IBS (irritable bowel syndrome)    Migraine headache 12/24/2012   Nephrolithiasis     Past Surgical History:  Procedure Laterality Date   Basket Surgery  2005, 06   kidney stones   TYMPANOSTOMY TUBE PLACEMENT Bilateral    as  a child    Family History  Problem Relation Age of Onset   Stroke Maternal Grandmother    Cancer Maternal Grandfather    Heart disease Paternal Grandfather    Alcohol abuse Other        GP   Breast cancer Cousin    Stroke Other        GGM    Social History   Socioeconomic History   Marital status: Married    Spouse name: Not on file   Number of children: Not on file   Years of education: Not on file   Highest education level: Not on file  Occupational History   Occupation: spanish    Employer: [employer] sport [employer]    Comment: @ [employer]  Tobacco Use   Smoking status: Every Day    Packs/day: 0.50    Types: Cigarettes    Last attempt to quit: 06/24/2016    Years since quitting: 6.3   Smokeless tobacco: Former    Quit date: 12/04/2008  Substance and Sexual Activity   Alcohol  use: Not Currently    Alcohol/week: 2.0 - 4.0 standard drinks of alcohol    Types: 2 - 4 Cans of beer per week    Comment: recovering AA   Drug use: No    Comment: Recovering NA   Sexual activity: Not on file  Other Topics Concern   Not on file  Social History Narrative   Not on file   Social Determinants of Health   Financial Resource Strain: Not on file  Food Insecurity: Not on file  Transportation Needs: Not on file  Physical Activity: Not on file  Stress: Not on file  Social Connections: Not on file  Intimate Partner Violence: Not on file   Review of Systems Sleeps okay No cough at night    Objective:   Physical Exam Constitutional:      Appearance: Normal appearance.  Pulmonary:     Effort: Pulmonary effort is normal.     Breath sounds: Normal breath sounds. No wheezing or rales.  Skin:    Comments: Eczematous rash proximal to several fingernails  Neurological:     Mental Status: She is alert.            Assessment & Plan:

## 2022-10-30 NOTE — Assessment & Plan Note (Signed)
Finally seems to be resolved Likely multiple infections back to back No symptoms to suggest recurrence of asthma Will just recheck CXR

## 2022-12-05 ENCOUNTER — Other Ambulatory Visit (HOSPITAL_COMMUNITY): Payer: Self-pay | Admitting: Psychiatry

## 2022-12-05 ENCOUNTER — Other Ambulatory Visit: Payer: Self-pay | Admitting: Family Medicine

## 2022-12-05 DIAGNOSIS — F902 Attention-deficit hyperactivity disorder, combined type: Secondary | ICD-10-CM

## 2022-12-05 NOTE — Telephone Encounter (Signed)
Last office visit 10/30/22 with Dr. Silvio Pate for cough and rash.  Last refilled 06/10/22 for #90 with 1 refill.  Next Appt: 12/06/22 with Dr. Lorelei Pont for rash on fingers.

## 2022-12-05 NOTE — Progress Notes (Unsigned)
    Candice Wilson T. Paige Monarrez, MD, Candice Mcgrath at [facility] [address] [place], [address]  Phone: [phone]  FAX: [place] - 47 y.o. female  MRN [id number]  Date of Birth: May 04, 1976  Date: 12/06/2022  PCP: Owens Loffler, MD  Referral: Owens Loffler, MD  No chief complaint on file.  Subjective:   Candice Mcgrath is a 47 y.o. very pleasant female patient with There is no height or weight on file to calculate BMI. who presents with the following:  Patient presents with a rash on her hands and fingers.     Review of Systems is noted in the HPI, as appropriate  Objective:   There were no vitals taken for this visit.  GEN: No acute distress; alert,appropriate. PULM: Breathing comfortably in no respiratory distress PSYCH: Normally interactive.   Laboratory and Imaging Data:  Assessment and Plan:   ***

## 2022-12-06 ENCOUNTER — Encounter: Payer: Self-pay | Admitting: Family Medicine

## 2022-12-06 ENCOUNTER — Ambulatory Visit: Payer: BC Managed Care – PPO | Admitting: Family Medicine

## 2022-12-06 VITALS — BP 90/60 | HR 96 | Temp 97.9°F | Ht 61.0 in | Wt 188.0 lb

## 2022-12-06 DIAGNOSIS — R21 Rash and other nonspecific skin eruption: Secondary | ICD-10-CM

## 2022-12-06 MED ORDER — BETAMETHASONE VALERATE 0.1 % EX OINT: 1.0000 | TOPICAL_OINTMENT | Freq: Two times a day (BID) | CUTANEOUS | 1 refills | Status: DC

## 2022-12-07 ENCOUNTER — Encounter: Payer: Self-pay | Admitting: Family Medicine

## 2022-12-24 ENCOUNTER — Other Ambulatory Visit (HOSPITAL_COMMUNITY): Payer: Self-pay | Admitting: Psychiatry

## 2022-12-24 DIAGNOSIS — F902 Attention-deficit hyperactivity disorder, combined type: Secondary | ICD-10-CM

## 2022-12-29 ENCOUNTER — Telehealth (HOSPITAL_COMMUNITY): Payer: Self-pay | Admitting: *Deleted

## 2022-12-29 DIAGNOSIS — F902 Attention-deficit hyperactivity disorder, combined type: Secondary | ICD-10-CM

## 2022-12-29 MED ORDER — ATOMOXETINE HCL 60 MG PO CAPS: 60.0000 mg | ORAL_CAPSULE | Freq: Every day | ORAL | 0 refills | Status: DC

## 2022-12-29 MED ORDER — ATOMOXETINE HCL 40 MG PO CAPS: 40.0000 mg | ORAL_CAPSULE | Freq: Every day | ORAL | 0 refills | Status: DC

## 2022-12-29 NOTE — Telephone Encounter (Signed)
Done

## 2022-12-29 NOTE — Telephone Encounter (Signed)
Pt called requesting a refill of Amotoxetine 40 mg and 60 mg. 60 mg last written on 10/09/22 and the 40 mg on 07/23/22 by PCP. Per last visit note on 10/10/23 you would be writing for both in the future. Pt next appointment scheduled for 01/08/23. Ok to send?

## 2023-01-08 ENCOUNTER — Encounter (HOSPITAL_COMMUNITY): Payer: Self-pay | Admitting: Psychiatry

## 2023-01-08 ENCOUNTER — Telehealth (HOSPITAL_BASED_OUTPATIENT_CLINIC_OR_DEPARTMENT_OTHER): Payer: BC Managed Care – PPO | Admitting: Psychiatry

## 2023-01-08 DIAGNOSIS — F902 Attention-deficit hyperactivity disorder, combined type: Secondary | ICD-10-CM

## 2023-01-08 DIAGNOSIS — F411 Generalized anxiety disorder: Secondary | ICD-10-CM | POA: Diagnosis not present

## 2023-01-08 DIAGNOSIS — F3341 Major depressive disorder, recurrent, in partial remission: Secondary | ICD-10-CM | POA: Diagnosis not present

## 2023-01-08 MED ORDER — ATOMOXETINE HCL 40 MG PO CAPS: 40.0000 mg | ORAL_CAPSULE | Freq: Every day | ORAL | 0 refills | Status: DC

## 2023-01-08 MED ORDER — ATOMOXETINE HCL 60 MG PO CAPS: 60.0000 mg | ORAL_CAPSULE | Freq: Every day | ORAL | 0 refills | Status: DC

## 2023-01-08 MED ORDER — ESCITALOPRAM OXALATE 20 MG PO TABS: 20.0000 mg | ORAL_TABLET | Freq: Every day | ORAL | 0 refills | Status: DC

## 2023-01-08 MED ORDER — BUSPIRONE HCL 5 MG PO TABS: 5.0000 mg | ORAL_TABLET | Freq: Two times a day (BID) | ORAL | 0 refills | Status: DC

## 2023-01-08 NOTE — Progress Notes (Signed)
[name]   Patient Location: Home Provider Location: Home Office  I connect with patient by video and verified that I am speaking with correct person by using two identifiers. I discussed the limitations of evaluation and management by telemedicine and the availability of in person appointments. I also discussed with the patient that there may be a patient responsible charge related to this service. The patient expressed understanding and agreed to proceed.  Candice Mcgrath PO:[id number] 47 y.o.  01/08/2023 3:47 PM  History of Present Illness:  Patient is evaluated by video session.  She is taking most of an days 100 mg Strattera which is helping her attention, focus and multitasking.  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  Patient told exam was very difficult and they ask too many questions but she is not sure how she performed.  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  Brace.  Overall she feels things are going okay.  She denies any crying spells or any feeling of hopelessness or worthlessness.  She denies any panic attack.  She continues to attend AA meeting and sponsor is very supportive.  She is now on Ozempic and she lost weight since the last visit.  She has no tremors, shakes or any EPS.  She like to keep the current medication.  Past Psychiatric History: History of ADHD, anxiety, depression, drug and ETOH use. 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  H/O rehab in 2007.  As per chart tried Prozac and Wellbutrin but no details. No history of suicidal attempt, psychosis, mania, hallucination, PTSD, panic attacks.  For past 14 years medicines were managed by her primary care physician.     Outpatient Encounter  Medications as of 01/08/2023  Medication Sig   albuterol (PROAIR HFA) 108 (90 Base) MCG/ACT inhaler INHALE 2 PUFFS INTO THE LUNGS EVERY 4 (FOUR) HOURS AS NEEDED. USE SPARINGLY   atomoxetine (STRATTERA) 40 MG capsule Take 1 capsule (40 mg total) by mouth daily.   atomoxetine (STRATTERA) 60 MG capsule Take 1 capsule (60 mg total) by mouth daily.   betamethasone valerate ointment (VALISONE) 0.1 % Apply 1 Application topically 2 (two) times daily.   busPIRone (BUSPAR) 5 MG tablet Take 1 tablet (5 mg total) by mouth 2 (two) times daily.   chlorproMAZINE (THORAZINE) 25 MG tablet Take 25 mg by mouth daily as needed.   EPINEPHrine 0.3 mg/0.3 mL IJ SOAJ injection USE UTD PRF SYSTEMATIC REACTION.   escitalopram (LEXAPRO) 20 MG tablet Take 1 tablet (20 mg total) by mouth daily.   hyoscyamine (LEVSIN SL) 0.125 MG SL tablet Place 1 tablet (0.125 mg total) under the tongue every 4 (four) hours as needed.   ketorolac (TORADOL) 10 MG tablet Take 1 tablet (10 mg total) by mouth every 6 (six) hours as needed for moderate pain or severe pain.   levocetirizine (XYZAL) 5 MG tablet SMARTSIG:1 Tablet(s) By Mouth Every Evening   lidocaine-prilocaine (EMLA) cream APPLY TOPICALLY 4 (FOUR) TIMES DAILY AS NEEDED. FOUR TIMES A DAY TO AFFECTED AREA, 1 MONTH SUPPLY   PAZEO 0.7 % SOLN    Semaglutide, 1 MG/DOSE, 4 MG/3ML SOPN Inject 1 mg as directed once a week.   Semaglutide, 2 MG/DOSE, 8  MG/3ML SOPN Inject 2 mg as directed once a week.   SUMAtriptan (IMITREX) 100 MG tablet Take by mouth.   tiZANidine (ZANAFLEX) 4 MG tablet TAKE 1 TABLET BY MOUTH EVERYDAY AT BEDTIME   triamcinolone cream (KENALOG) 0.1 % Apply 1 application topically 2 (two) times daily.   valACYclovir (VALTREX) 500 MG tablet Take 1 tablet (500 mg total) by mouth 2 (two) times daily. For 3 days during a flare   No facility-administered encounter medications on file as of 01/08/2023.    No results found for this or any previous visit (from the past 2160  hour(s)).   Psychiatric Specialty Exam: Physical Exam  Review of Systems  Weight 181 lb (82.1 kg).There is no height or weight on file to calculate BMI.  General Appearance: Casual  Eye Contact:  Good  Speech:  Clear and Coherent  Volume:  Normal  Mood:  Anxious  Affect:  Appropriate  Thought Process:  Goal Directed  Orientation:  Full (Time, Place, and Person)  Thought Content:  Logical  Suicidal Thoughts:  No  Homicidal Thoughts:  No  Memory:  Immediate;   Good Recent;   Good Remote;   Good  Judgement:  Intact  Insight:  Present  Psychomotor Activity:  Normal  Concentration:  Concentration: Good and Attention Span: Good  Recall:  Good  Fund of Knowledge:  Good  Language:  Good  Akathisia:  No  Handed:  Right  AIMS (if indicated):     Assets:  Communication Skills Desire for Improvement Housing Social Support Talents/Skills Transportation  ADL's:  Intact  Cognition:  WNL  Sleep:  ok     Assessment/Plan: Attention deficit hyperactivity disorder (ADHD), combined type - Plan: atomoxetine (STRATTERA) 40 MG capsule, atomoxetine (STRATTERA) 60 MG capsule  Generalized anxiety disorder - Plan: escitalopram (LEXAPRO) 20 MG tablet, busPIRone (BUSPAR) 5 MG tablet  Major depressive disorder, recurrent episode, in partial remission ([place]) - Plan: escitalopram (LEXAPRO) 20 MG tablet, busPIRone (BUSPAR) 5 MG tablet  Patient is stable on current medication.  She is taking BuSpar 5 mg 2 times a day along with Lexapro 20 mg daily which is helping her depression and anxiety.  Continue Strattera 100 mg most of the days but sometimes she takes 60 mg.  Discussed medication side effects and benefits.  Encouraged to continue [place] meeting.  Recommend to call us back if she has any question or any concern.  Follow-up in 3 months.   Follow Up Instructions:     I discussed the assessment and treatment plan with the patient. The patient was provided an opportunity to ask questions and all  were answered. The patient agreed with the plan and demonstrated an understanding of the instructions.   The patient was advised to call back or seek an in-person evaluation if the symptoms worsen or if the condition fails to improve as anticipated.    Collaboration of Care: Other provider involved in patient's care AEB notes are available in epic to review.  Patient/Guardian was advised Release of Information must be obtained prior to any record release in order to collaborate their care with an outside provider. Patient/Guardian was advised if they have not already done so to contact the registration department to sign all necessary forms in order for [place] to release information regarding their care.   Consent: Patient/Guardian gives verbal consent for treatment and assignment of benefits for services provided during this visit. Patient/Guardian expressed understanding and agreed to proceed.     I provided 15 minutes  of non face to face time during this encounter.  [name], MD 01/08/2023

## 2023-01-10 ENCOUNTER — Telehealth (HOSPITAL_COMMUNITY): Payer: Self-pay | Admitting: *Deleted

## 2023-01-10 NOTE — Telephone Encounter (Signed)
PA FOR AMTOXETINE HCL 40 MG CAPSULES #90 SUBMITTED TO BCBS VIA [employer].  AWAITING DETERMINATION.

## 2023-01-23 ENCOUNTER — Institutional Professional Consult (permissible substitution): Payer: BC Managed Care – PPO | Admitting: Plastic Surgery

## 2023-02-05 ENCOUNTER — Other Ambulatory Visit (HOSPITAL_COMMUNITY): Payer: Self-pay | Admitting: Psychiatry

## 2023-02-05 DIAGNOSIS — F411 Generalized anxiety disorder: Secondary | ICD-10-CM

## 2023-02-05 DIAGNOSIS — F3341 Major depressive disorder, recurrent, in partial remission: Secondary | ICD-10-CM

## 2023-02-24 ENCOUNTER — Other Ambulatory Visit (HOSPITAL_COMMUNITY): Payer: Self-pay | Admitting: Psychiatry

## 2023-02-24 DIAGNOSIS — F902 Attention-deficit hyperactivity disorder, combined type: Secondary | ICD-10-CM

## 2023-03-07 ENCOUNTER — Telehealth (HOSPITAL_COMMUNITY): Payer: BC Managed Care – PPO | Admitting: Psychiatry

## 2023-04-01 NOTE — Progress Notes (Unsigned)
    Nusaiba Guallpa T. Rae Sutcliffe, MD, CAQ Sports Medicine [facility] at [facility] [address] [place], [address]  Phone: [phone]  FAX: [phone]  Alessandria Henken - 47 y.o. female  MRN [id number]  Date of Birth: 1975-11-21  Date: 04/02/2023  PCP: [name], MD  Referral: [name], MD  No chief complaint on file.  Subjective:   Taisa Deloria is a 47 y.o. very pleasant female patient with There is no height or weight on file to calculate BMI. who presents with the following:  She presents with ongoing shoulder pain over the last several months.    Review of Systems is noted in the HPI, as appropriate  Objective:   There were no vitals taken for this visit.  GEN: No acute distress; alert,appropriate. PULM: Breathing comfortably in no respiratory distress PSYCH: Normally interactive.   Laboratory and Imaging Data:  Assessment and Plan:   ***

## 2023-04-02 ENCOUNTER — Encounter: Payer: Self-pay | Admitting: Family Medicine

## 2023-04-02 ENCOUNTER — Ambulatory Visit: Payer: BC Managed Care – PPO | Admitting: Family Medicine

## 2023-04-02 VITALS — BP 120/74 | HR 76 | Temp 97.6°F | Ht 61.0 in | Wt 180.2 lb

## 2023-04-02 DIAGNOSIS — M7502 Adhesive capsulitis of left shoulder: Secondary | ICD-10-CM

## 2023-04-02 MED ORDER — VALACYCLOVIR HCL 500 MG PO TABS: 500.0000 mg | ORAL_TABLET | Freq: Two times a day (BID) | ORAL | 5 refills | Status: DC

## 2023-04-03 ENCOUNTER — Encounter: Payer: Self-pay | Admitting: Family Medicine

## 2023-04-09 ENCOUNTER — Telehealth (HOSPITAL_BASED_OUTPATIENT_CLINIC_OR_DEPARTMENT_OTHER): Payer: BC Managed Care – PPO | Admitting: Psychiatry

## 2023-04-09 ENCOUNTER — Encounter (HOSPITAL_COMMUNITY): Payer: Self-pay | Admitting: Psychiatry

## 2023-04-09 ENCOUNTER — Ambulatory Visit: Payer: BC Managed Care – PPO | Admitting: Family Medicine

## 2023-04-09 ENCOUNTER — Other Ambulatory Visit (HOSPITAL_COMMUNITY): Payer: Self-pay | Admitting: Psychiatry

## 2023-04-09 VITALS — Wt 180.0 lb

## 2023-04-09 DIAGNOSIS — F3341 Major depressive disorder, recurrent, in partial remission: Secondary | ICD-10-CM

## 2023-04-09 DIAGNOSIS — F411 Generalized anxiety disorder: Secondary | ICD-10-CM | POA: Diagnosis not present

## 2023-04-09 DIAGNOSIS — F902 Attention-deficit hyperactivity disorder, combined type: Secondary | ICD-10-CM

## 2023-04-09 MED ORDER — BUSPIRONE HCL 5 MG PO TABS: 5.0000 mg | ORAL_TABLET | Freq: Three times a day (TID) | ORAL | 0 refills | Status: DC | PRN

## 2023-04-09 MED ORDER — ESCITALOPRAM OXALATE 20 MG PO TABS: 20.0000 mg | ORAL_TABLET | Freq: Every day | ORAL | 0 refills | Status: DC

## 2023-04-09 MED ORDER — ATOMOXETINE HCL 60 MG PO CAPS: 60.0000 mg | ORAL_CAPSULE | Freq: Every day | ORAL | 0 refills | Status: DC

## 2023-04-09 NOTE — Progress Notes (Signed)
[facility] MD Virtual Progress Note   Patient Location: Home Provider Location: Home Office  I connect with patient by video and verified that I am speaking with correct person by using two identifiers. I discussed the limitations of evaluation and management by telemedicine and the availability of in person appointments. I also discussed with the patient that there may be a patient responsible charge related to this service. The patient expressed understanding and agreed to proceed.  Candice Mcgrath [id number] 47 y.o.  04/09/2023 3:26 PM  History of Present Illness:  Patient is evaluated by video session.  She reported things are going very well but there are days when she noticed increased dysphoria and anxiety.  Patient told usually around her menstrual cycle she feel more intense symptoms but after a few days are Better.  She reported her attention, concentration is much better.  She started cleaning the house.  She started going to gym and she feels good about it.  She continues to attend an AA meeting and recently picked up 4 years of sobriety.  She is pleased that not get distracted since taking the medication and her biggest achievement is to submitted her board exams last month.  Patient told results will announced at the end of the summer.  She sleeps good.  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  Patient told recently had a left shoulder pain and given the diagnosis of frozen shoulder.  She is doing some physical therapy and hoping to get better so she can start gaining dancing which she always enjoyed.  Most of the time she takes her Strattera 60 mg in the morning but occasionally she takes 40 mg in the afternoon.  She does not need the 40 mg prescription but do like to get her regular prescriptions which are Strattera 60 mg, BuSpar and Lexapro.  She has no tremors, shakes or any EPS.  Past Psychiatric History: History of ADHD, anxiety, depression,  drug and ETOH use. 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  H/O rehab in 2007.  As per chart tried Prozac and Wellbutrin but no details. No history of suicidal attempt, psychosis, mania, hallucination, PTSD, panic attacks.  For past 14 years medicines were managed by her primary care physician.     Outpatient Encounter Medications as of 04/09/2023  Medication Sig   albuterol (PROAIR HFA) 108 (90 Base) MCG/ACT inhaler INHALE 2 PUFFS INTO THE LUNGS EVERY 4 (FOUR) HOURS AS NEEDED. USE SPARINGLY   atomoxetine (STRATTERA) 40 MG capsule Take 1 capsule (40 mg total) by mouth daily.   atomoxetine (STRATTERA) 60 MG capsule Take 1 capsule (60 mg total) by mouth daily.   betamethasone valerate ointment (VALISONE) 0.1 % Apply 1 Application topically 2 (two) times daily.   busPIRone (BUSPAR) 5 MG tablet Take 1 tablet (5 mg total) by mouth 2 (two) times daily.   chlorproMAZINE (THORAZINE) 25 MG tablet Take 25 mg by mouth daily as needed.   EPINEPHrine 0.3 mg/0.3 mL IJ SOAJ injection USE UTD PRF SYSTEMATIC REACTION.   escitalopram (LEXAPRO) 20 MG tablet Take 1 tablet (20 mg total) by mouth daily.   hyoscyamine (LEVSIN SL) 0.125 MG SL tablet Place 1 tablet (0.125 mg total) under the tongue every 4 (four) hours as needed.   ketorolac (TORADOL) 10 MG tablet Take 1 tablet (10 mg total) by mouth every 6 (six) hours as needed for moderate pain or severe pain.   levocetirizine (XYZAL) 5 MG tablet  SMARTSIG:1 Tablet(s) By Mouth Every Evening   lidocaine-prilocaine (EMLA) cream APPLY TOPICALLY 4 (FOUR) TIMES DAILY AS NEEDED. FOUR TIMES A DAY TO AFFECTED AREA, 1 MONTH SUPPLY   PAZEO 0.7 % SOLN    Semaglutide, 1 MG/DOSE, 4 MG/3ML SOPN Inject 1 mg as directed once a week.   Semaglutide, 2 MG/DOSE, 8 MG/3ML SOPN Inject 2 mg as directed once a week. (Patient not taking: Reported on 04/02/2023)   SUMAtriptan (IMITREX) 100 MG tablet Take by mouth.   tiZANidine (ZANAFLEX) 4 MG tablet TAKE 1 TABLET BY MOUTH  EVERYDAY AT BEDTIME   triamcinolone cream (KENALOG) 0.1 % Apply 1 application topically 2 (two) times daily.   valACYclovir (VALTREX) 500 MG tablet Take 1 tablet (500 mg total) by mouth 2 (two) times daily. For 3 days during a flare   No facility-administered encounter medications on file as of 04/09/2023.    No results found for this or any previous visit (from the past 2160 hour(s)).   Psychiatric Specialty Exam: Physical Exam  Review of Systems  Musculoskeletal:        Left shoulder pain    Weight 180 lb (81.6 kg), last menstrual period 03/15/2023.There is no height or weight on file to calculate BMI.  General Appearance: Casual  Eye Contact:  Good  Speech:  Clear and Coherent  Volume:  Normal  Mood:  Euthymic  Affect:  Appropriate  Thought Process:  Goal Directed  Orientation:  Full (Time, Place, and Person)  Thought Content:  Logical  Suicidal Thoughts:  No  Homicidal Thoughts:  No  Memory:  Immediate;   Good Recent;   Good Remote;   Good  Judgement:  Intact  Insight:  Present  Psychomotor Activity:  Normal  Concentration:  Concentration: Good and Attention Span: Good  Recall:  Good  Fund of Knowledge:  Good  Language:  Good  Akathisia:  No  Handed:  Right  AIMS (if indicated):     Assets:  Communication Skills Desire for Improvement Housing Resilience Social Support Talents/Skills Transportation  ADL's:  Intact  Cognition:  WNL  Sleep:  ok     Assessment/Plan: Major depressive disorder, recurrent episode, in partial remission (HCC) - Plan: busPIRone (BUSPAR) 5 MG tablet, escitalopram (LEXAPRO) 20 MG tablet  Attention deficit hyperactivity disorder (ADHD), combined type - Plan: atomoxetine (STRATTERA) 60 MG capsule  Generalized anxiety disorder - Plan: busPIRone (BUSPAR) 5 MG tablet, escitalopram (LEXAPRO) 20 MG tablet  Discussed possibility of premenstrual dysphoria.  Recommend to take extra BuSpar around that time to help the symptoms.  Overall  patient feels things are going very well.  She is excited as her husband's family coming for a few days and she is trying to clean the house which she usually enjoys.  Does not meet Strattera 40 mg but do want to get the prescription of Strattera 60 mg in the morning.  We will also continue Lexapro 20 mg daily and she can try BuSpar 5 mg up to 3 times a day to help with anxiety.  Recommend to call us back if she has any question or any concern.  Encouraged to continue AA meeting.  We will follow-up in 3 months.  Follow Up Instructions:     I discussed the assessment and treatment plan with the patient. The patient was provided an opportunity to ask questions and all were answered. The patient agreed with the plan and demonstrated an understanding of the instructions.   The patient was advised to call back or  seek an in-person evaluation if the symptoms worsen or if the condition fails to improve as anticipated.    Collaboration of Care: Other provider involved in patient's care AEB notes are available in epic to review  Patient/Guardian was advised Release of Information must be obtained prior to any record release in order to collaborate their care with an outside provider. Patient/Guardian was advised if they have not already done so to contact the registration department to sign all necessary forms in order for [place] to release information regarding their care.   Consent: Patient/Guardian gives verbal consent for treatment and assignment of benefits for services provided during this visit. Patient/Guardian expressed understanding and agreed to proceed.     I provided 20 minutes of non face to face time during this encounter.  Note: This document was prepared by [employer] voice dictation technology and any errors that results from this process are unintentional.    Cleotis Nipper, MD 04/09/2023

## 2023-06-03 ENCOUNTER — Other Ambulatory Visit: Payer: Self-pay | Admitting: Family Medicine

## 2023-06-04 ENCOUNTER — Encounter: Payer: Self-pay | Admitting: Pharmacist

## 2023-06-04 ENCOUNTER — Other Ambulatory Visit: Payer: Self-pay

## 2023-06-04 ENCOUNTER — Other Ambulatory Visit (HOSPITAL_COMMUNITY): Payer: Self-pay

## 2023-06-04 MED ORDER — LEVOCETIRIZINE DIHYDROCHLORIDE 5 MG PO TABS
5.0000 mg | ORAL_TABLET | Freq: Every evening | ORAL | 3 refills | Status: AC
  Filled 2023-06-04 – 2023-06-12 (×2): qty 90, 90d supply, fill #0

## 2023-06-04 NOTE — Telephone Encounter (Signed)
Please schedule CPE with fasting labs prior with Dr. Copland.  

## 2023-06-04 NOTE — Telephone Encounter (Signed)
Patient has been scheduled

## 2023-06-07 ENCOUNTER — Encounter (INDEPENDENT_AMBULATORY_CARE_PROVIDER_SITE_OTHER): Payer: Self-pay

## 2023-06-07 ENCOUNTER — Other Ambulatory Visit: Payer: Self-pay

## 2023-06-10 ENCOUNTER — Other Ambulatory Visit: Payer: Self-pay | Admitting: Family Medicine

## 2023-06-11 ENCOUNTER — Other Ambulatory Visit: Payer: Self-pay | Admitting: Family Medicine

## 2023-06-11 DIAGNOSIS — E782 Mixed hyperlipidemia: Secondary | ICD-10-CM

## 2023-06-11 DIAGNOSIS — E559 Vitamin D deficiency, unspecified: Secondary | ICD-10-CM

## 2023-06-11 DIAGNOSIS — E119 Type 2 diabetes mellitus without complications: Secondary | ICD-10-CM

## 2023-06-11 DIAGNOSIS — Z79899 Other long term (current) drug therapy: Secondary | ICD-10-CM

## 2023-06-11 NOTE — Telephone Encounter (Signed)
Last office visit 04/02/23 for shoulder pain.  Last refilled 12/05/2022 for #90 with 1 refill.  Next Appt: CPE 06/27/2023.

## 2023-06-12 ENCOUNTER — Other Ambulatory Visit: Payer: Self-pay

## 2023-06-21 ENCOUNTER — Other Ambulatory Visit (INDEPENDENT_AMBULATORY_CARE_PROVIDER_SITE_OTHER): Payer: BC Managed Care – PPO

## 2023-06-21 DIAGNOSIS — E559 Vitamin D deficiency, unspecified: Secondary | ICD-10-CM | POA: Diagnosis not present

## 2023-06-21 DIAGNOSIS — E782 Mixed hyperlipidemia: Secondary | ICD-10-CM

## 2023-06-21 DIAGNOSIS — E119 Type 2 diabetes mellitus without complications: Secondary | ICD-10-CM | POA: Diagnosis not present

## 2023-06-21 DIAGNOSIS — Z79899 Other long term (current) drug therapy: Secondary | ICD-10-CM | POA: Diagnosis not present

## 2023-06-21 LAB — CBC WITH DIFFERENTIAL/PLATELET
Basophils Absolute: 0 10*3/uL (ref 0.0–0.1)
Basophils Relative: 0.5 % (ref 0.0–3.0)
Eosinophils Absolute: 0.3 10*3/uL (ref 0.0–0.7)
Eosinophils Relative: 3.4 % (ref 0.0–5.0)
HCT: 44 % (ref 36.0–46.0)
Hemoglobin: 14.4 g/dL (ref 12.0–15.0)
Lymphocytes Relative: 25.5 % (ref 12.0–46.0)
Lymphs Abs: 2.3 10*3/uL (ref 0.7–4.0)
MCHC: 32.7 g/dL (ref 30.0–36.0)
MCV: 93.3 fl (ref 78.0–100.0)
Monocytes Absolute: 0.8 10*3/uL (ref 0.1–1.0)
Monocytes Relative: 9.3 % (ref 3.0–12.0)
Neutro Abs: 5.5 10*3/uL (ref 1.4–7.7)
Neutrophils Relative %: 61.3 % (ref 43.0–77.0)
Platelets: 268 10*3/uL (ref 150.0–400.0)
RBC: 4.71 Mil/uL (ref 3.87–5.11)
RDW: 13.6 % (ref 11.5–15.5)
WBC: 9 10*3/uL (ref 4.0–10.5)

## 2023-06-21 LAB — HEPATIC FUNCTION PANEL
ALT: 14 U/L (ref 0–35)
AST: 14 U/L (ref 0–37)
Albumin: 3.9 g/dL (ref 3.5–5.2)
Alkaline Phosphatase: 66 U/L (ref 39–117)
Bilirubin, Direct: 0.1 mg/dL (ref 0.0–0.3)
Total Bilirubin: 0.4 mg/dL (ref 0.2–1.2)
Total Protein: 6.7 g/dL (ref 6.0–8.3)

## 2023-06-21 LAB — LIPID PANEL
Cholesterol: 137 mg/dL (ref 0–200)
HDL: 43.3 mg/dL (ref >39.00)
LDL Cholesterol: 79 mg/dL (ref 0–99)
NonHDL: 93.44
Total CHOL/HDL Ratio: 3
Triglycerides: 71 mg/dL (ref 0.0–149.0)
VLDL: 14.2 mg/dL (ref 0.0–40.0)

## 2023-06-21 LAB — BASIC METABOLIC PANEL
BUN: 9 mg/dL (ref 6–23)
CO2: 26 mEq/L (ref 19–32)
Calcium: 9 mg/dL (ref 8.4–10.5)
Chloride: 105 mEq/L (ref 96–112)
Creatinine, Ser: 0.85 mg/dL (ref 0.40–1.20)
GFR: 82.56 mL/min (ref >60.00)
Glucose, Bld: 93 mg/dL (ref 70–99)
Potassium: 4.3 mEq/L (ref 3.5–5.1)
Sodium: 138 mEq/L (ref 135–145)

## 2023-06-21 LAB — MICROALBUMIN / CREATININE URINE RATIO
Creatinine,U: 165.7 mg/dL
Microalb Creat Ratio: 0.8 mg/g (ref 0.0–30.0)
Microalb, Ur: 1.4 mg/dL (ref 0.0–1.9)

## 2023-06-21 LAB — VITAMIN D 25 HYDROXY (VIT D DEFICIENCY, FRACTURES): VITD: 31.97 ng/mL (ref 30.00–100.00)

## 2023-06-21 LAB — HEMOGLOBIN A1C: Hgb A1c MFr Bld: 5.7 % (ref 4.6–6.5)

## 2023-06-24 ENCOUNTER — Ambulatory Visit
Admission: RE | Admit: 2023-06-24 | Discharge: 2023-06-24 | Disposition: A | Discharge status: Home / Self Care | Payer: BC Managed Care – PPO | Source: Ambulatory Visit | Attending: Family Medicine | Admitting: Family Medicine

## 2023-06-24 ENCOUNTER — Other Ambulatory Visit: Payer: Self-pay

## 2023-06-24 ENCOUNTER — Encounter: Payer: Self-pay | Admitting: Family Medicine

## 2023-06-24 VITALS — BP 107/71 | HR 98 | Temp 98.3°F | Resp 16

## 2023-06-24 DIAGNOSIS — U071 COVID-19: Secondary | ICD-10-CM

## 2023-06-24 DIAGNOSIS — Z8709 Personal history of other diseases of the respiratory system: Secondary | ICD-10-CM

## 2023-06-24 MED ORDER — ALBUTEROL SULFATE HFA 108 (90 BASE) MCG/ACT IN AERS: 1.0000 | INHALATION_SPRAY | Freq: Four times a day (QID) | RESPIRATORY_TRACT | 0 refills | Status: AC | PRN

## 2023-06-24 MED ORDER — PAXLOVID (300/100) 20 X 150 MG & 10 X 100MG PO TBPK: ORAL_TABLET | ORAL | 0 refills | Status: DC

## 2023-06-24 NOTE — ED Triage Notes (Signed)
Cough, congestion since yesterday. Tested positive for COVID this morning.

## 2023-06-24 NOTE — ED Provider Notes (Addendum)
[facility]    CSN: [id number] Arrival date & time: 06/24/23  1352      History   Chief Complaint Chief Complaint  Patient presents with   Cough    I just tested positive for COVID. - Entered by patient    HPI Candice Mcgrath is a 47 y.o. female.   Patient is a Runner, broadcasting/film/video.  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  She started feeling poorly yesterday with bodyaches and cough and fever.  Did a COVID test today and it is positive.  She is here to discuss Paxlovid.  She states that she is always slow to recover from illness and feels she needs medicine.  She also has a history of asthma.    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HCC)    adderrall, Xanax, MJ, others, no IV   Hyperlipidemia    IBS (irritable bowel syndrome)    Migraine headache 12/24/2012   Nephrolithiasis     Patient Active Problem List   Diagnosis Date Noted   Mild persistent asthma, uncomplicated 12/06/2020   Diabetes mellitus type 2, diet-controlled (HCC) 12/06/2020   Generalized anxiety disorder 09/02/2014   Major depressive disorder, recurrent episode, in partial remission (HCC) 05/18/2014   Personal history of drug dependence (HCC) 12/24/2012   Migraine headache 12/24/2012   IBS 12/13/2009   Hyperlipidemia 11/08/2009   Attention deficit disorder 11/08/2009   Allergic rhinitis 11/01/2008   NEPHROLITHIASIS, HX OF 11/01/2008   GENITAL HERPES 10/30/2008   Personal history of alcoholism (HCC) 10/30/2008    Past Surgical History:  Procedure Laterality Date   Basket Surgery  2005, 06   kidney stones   TYMPANOSTOMY TUBE PLACEMENT Bilateral    as a child    OB History   No obstetric history on file.      Home Medications    Prior to Admission medications   Medication Sig Start Date End Date Taking? Authorizing  Provider  albuterol (VENTOLIN HFA) 108 (90 Base) MCG/ACT inhaler Inhale 1-2 puffs into the lungs every 6 (six) hours as needed for wheezing or shortness of breath. 06/24/23  Yes Eustace Moore, MD  nirmatrelvir & ritonavir (PAXLOVID, 300/100,) 20 x 150 MG & 10 x 100MG  TBPK Take as directed 06/24/23  Yes Eustace Moore, MD  atomoxetine (STRATTERA) 40 MG capsule Take 1 capsule (40 mg total) by mouth daily. 01/08/23   Arfeen, Phillips Grout, MD  busPIRone (BUSPAR) 5 MG tablet Take 1 tablet (5 mg total) by mouth 3 (three) times daily as needed. 04/09/23   Arfeen, Phillips Grout, MD  chlorproMAZINE (THORAZINE) 25 MG tablet Take 25 mg by mouth daily as needed. 10/07/20   [provider]  EPINEPHrine 0.3 mg/0.3 mL IJ SOAJ injection USE UTD PRF SYSTEMATIC REACTION. 04/06/17   [provider]  escitalopram (LEXAPRO) 20 MG tablet Take 1 tablet (20 mg total) by mouth daily. 04/09/23   Arfeen, Phillips Grout, MD  hyoscyamine (LEVSIN SL) 0.125 MG SL tablet Place 1 tablet (0.125 mg total) under the tongue every 4 (four) hours as needed. 08/10/20   Copland, Karleen Hampshire, MD  ketorolac (TORADOL) 10 MG tablet Take 1 tablet (10 mg total) by mouth every 6 (six) hours as needed for moderate pain or severe pain. 09/14/21   Copland, Karleen Hampshire,  MD  levocetirizine (XYZAL) 5 MG tablet Take 1 tablet (5 mg total) by mouth every evening. 06/04/23   Copland, Karleen Hampshire, MD  lidocaine-prilocaine (EMLA) cream APPLY TOPICALLY 4 (FOUR) TIMES DAILY AS NEEDED. FOUR TIMES A DAY TO AFFECTED AREA, 1 MONTH SUPPLY 08/10/20   Copland, Karleen Hampshire, MD  PAZEO 0.7 % SOLN  04/06/17   [provider]  Semaglutide, 1 MG/DOSE, 4 MG/3ML SOPN Inject 1 mg as directed once a week. 07/27/22   Copland, Karleen Hampshire, MD  SUMAtriptan (IMITREX) 100 MG tablet Take by mouth. 11/25/20   [provider]  tiZANidine (ZANAFLEX) 4 MG tablet TAKE 1 TABLET BY MOUTH EVERYDAY AT BEDTIME 06/11/23   Copland, Karleen Hampshire, MD  triamcinolone cream (KENALOG) 0.1 % Apply 1 application  topically 2 (two) times daily. 05/07/21   Demetrio Lapping, PA-C  valACYclovir (VALTREX) 500 MG tablet Take 1 tablet (500 mg total) by mouth 2 (two) times daily. For 3 days during a flare 04/02/23   Copland, Karleen Hampshire, MD    Family History Family History  Problem Relation Age of Onset   Stroke Maternal Grandmother    Cancer Maternal Grandfather    Heart disease Paternal Grandfather    Alcohol abuse Other        GP   Breast cancer Cousin    Stroke Other        GGM    Social History Social History   Tobacco Use   Smoking status: Every Day    Current packs/day: 0.00    Types: Cigarettes    Last attempt to quit: 06/24/2016    Years since quitting: 7.0   Smokeless tobacco: Former    Quit date: 12/04/2008  Substance Use Topics   Alcohol use: Not Currently    Alcohol/week: 2.0 - 4.0 standard drinks of alcohol    Types: 2 - 4 Cans of beer per week    Comment: recovering AA   Drug use: No    Comment: Recovering NA     Allergies   Sulfamethoxazole-trimethoprim and Sulfa antibiotics   Review of Systems Review of Systems See HPI  Physical Exam Triage Vital Signs ED Triage Vitals  Encounter Vitals Group     BP 06/24/23 1358 107/71     Systolic BP Percentile --      Diastolic BP Percentile --      Pulse Rate 06/24/23 1358 98     Resp 06/24/23 1358 16     Temp 06/24/23 1358 98.3 F (36.8 C)     Temp Source 06/24/23 1358 Oral     SpO2 06/24/23 1358 99 %     Weight --      Height --      Head Circumference --      Peak Flow --      Pain Score 06/24/23 1400 0     Pain Loc --      Pain Education --      Exclude from Growth Chart --    No data found.  Updated Vital Signs BP 107/71 (BP Location: Left Arm)   Pulse 98   Temp 98.3 F (36.8 C) (Oral)   Resp 16   SpO2 99%    Physical Exam Constitutional:      General: She is not in acute distress.    Appearance: She is well-developed. She is obese.  HENT:     Head: Normocephalic and atraumatic.  Eyes:      Conjunctiva/sclera: Conjunctivae normal.     Pupils: Pupils are equal,  round, and reactive to light.  Cardiovascular:     Rate and Rhythm: Normal rate.  Pulmonary:     Effort: Pulmonary effort is normal. No respiratory distress.     Breath sounds: Normal breath sounds.     Comments: Lungs are clear Abdominal:     General: There is no distension.     Palpations: Abdomen is soft.  Musculoskeletal:        General: Normal range of motion.     Cervical back: Normal range of motion.  Skin:    General: Skin is warm and dry.  Neurological:     Mental Status: She is alert.      UC Treatments / Results  Labs (all labs ordered are listed, but only abnormal results are displayed) Labs Reviewed - No data to display  EKG   Radiology No results found.  Procedures Procedures (including critical care time)  Medications Ordered in UC Medications - No data to display  Initial Impression / Assessment and Plan / UC Course  I have reviewed the triage vital signs and the nursing notes.  Pertinent labs & imaging results that were available during my care of the patient were reviewed by me and considered in my medical decision making (see chart for details).     Final Clinical Impressions(s) / UC Diagnoses   Final diagnoses:  COVID-19  History of asthma     Discharge Instructions      Take Paxlovid 2 times a day Use albuterol inhaler if needed Drink lots of fluids Call for problems     ED Prescriptions     Medication Sig Dispense Auth. Provider   nirmatrelvir & ritonavir (PAXLOVID, 300/100,) 20 x 150 MG & 10 x 100MG  TBPK Take as directed 1 each Eustace Moore, MD   albuterol (VENTOLIN HFA) 108 (90 Base) MCG/ACT inhaler Inhale 1-2 puffs into the lungs every 6 (six) hours as needed for wheezing or shortness of breath. 18 g Eustace Moore, MD      PDMP not reviewed this encounter.   Eustace Moore, MD 06/24/23 1416    Eustace Moore, MD 06/24/23  [phone]

## 2023-06-24 NOTE — Discharge Instructions (Signed)
Take Paxlovid 2 times a day Use albuterol inhaler if needed Drink lots of fluids Call for problems

## 2023-06-27 ENCOUNTER — Encounter: Payer: BC Managed Care – PPO | Admitting: Family Medicine

## 2023-07-09 ENCOUNTER — Encounter (HOSPITAL_COMMUNITY): Payer: Self-pay | Admitting: Psychiatry

## 2023-07-09 ENCOUNTER — Telehealth (HOSPITAL_BASED_OUTPATIENT_CLINIC_OR_DEPARTMENT_OTHER): Payer: BC Managed Care – PPO | Admitting: Psychiatry

## 2023-07-09 ENCOUNTER — Other Ambulatory Visit (HOSPITAL_COMMUNITY): Payer: Self-pay | Admitting: Psychiatry

## 2023-07-09 VITALS — Wt 170.0 lb

## 2023-07-09 DIAGNOSIS — F411 Generalized anxiety disorder: Secondary | ICD-10-CM | POA: Diagnosis not present

## 2023-07-09 DIAGNOSIS — F3341 Major depressive disorder, recurrent, in partial remission: Secondary | ICD-10-CM | POA: Diagnosis not present

## 2023-07-09 DIAGNOSIS — F902 Attention-deficit hyperactivity disorder, combined type: Secondary | ICD-10-CM

## 2023-07-09 MED ORDER — BUSPIRONE HCL 5 MG PO TABS
5.0000 mg | ORAL_TABLET | Freq: Three times a day (TID) | ORAL | 0 refills | Status: DC | PRN
Start: 2023-07-09 — End: 2023-09-12

## 2023-07-09 MED ORDER — ATOMOXETINE HCL 60 MG PO CAPS
60.0000 mg | ORAL_CAPSULE | Freq: Every day | ORAL | 0 refills | Status: DC
Start: 2023-07-09 — End: 2023-09-12

## 2023-07-09 MED ORDER — ATOMOXETINE HCL 40 MG PO CAPS
40.0000 mg | ORAL_CAPSULE | Freq: Every day | ORAL | 0 refills | Status: DC
Start: 2023-07-09 — End: 2023-09-12

## 2023-07-09 MED ORDER — ESCITALOPRAM OXALATE 20 MG PO TABS
20.0000 mg | ORAL_TABLET | Freq: Every day | ORAL | 0 refills | Status: DC
Start: 2023-07-09 — End: 2023-09-12
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
## 2023-07-10 NOTE — Progress Notes (Signed)
Captain Blucher T. Sergei Delo, MD, CAQ Sports Medicine [facility] at [facility] [address] [place], [address]  Phone: [phone]  FAX: [phone]  Candice Mcgrath - 47 y.o. female  MRN [id number]  Date of Birth: September 15, 1976  Date: 07/12/2023  PCP: [name], MD  Referral: [name], MD  Chief Complaint  Patient presents with   Annual Exam   Patient Care Team: [name], MD as PCP - General Zelphia Cairo, MD as Consulting Physician (Obstetrics and Gynecology) Su Grand, MD (Inactive) as Consulting Physician (Urology) Subjective:   Candice Mcgrath is a 47 y.o. pleasant patient who presents with the following:  Health Maintenance Summary Reviewed and updated, unless pt declines services.  Tobacco History Reviewed. Smoking - less than 5 cigs a day and vaping Alcohol: No concerns, no excessive use Exercise Habits: Some activity, rec at least 30 mins 5 times a week - walking maybe once a week STD concerns: none Drug Use: None Lumps or breast concerns: no  Foot - done Eye exam - no  Colon - will refer Flu - done today Covid - will do Mammo - will do  Wt Readings from Last 3 Encounters:  07/12/23 173 lb (78.5 kg)  04/02/23 180 lb 4 oz (81.8 kg)  12/06/22 188 lb (85.3 kg)     Health Maintenance  Topic Date Due   OPHTHALMOLOGY EXAM  Never done   Colonoscopy  Never done   COVID-19 Vaccine (5 - 2023-24 season) 06/24/2023   HEMOGLOBIN A1C  12/21/2023   Diabetic kidney evaluation - eGFR measurement  06/20/2024   Diabetic kidney evaluation - Urine ACR  06/20/2024   FOOT EXAM  07/11/2024   Cervical Cancer Screening (HPV/Pap Cotest)  12/03/2024   DTaP/Tdap/Td (2 - Td or Tdap) 04/05/2027   INFLUENZA VACCINE  Completed   Hepatitis C Screening  Completed   HIV Screening  Completed   HPV VACCINES  Aged Out    Immunization History  Administered Date(s) Administered   Hep A / Hep B 04/04/2017   Influenza, High  Dose Seasonal PF 09/07/2016, 09/25/2018, 09/24/2019, 09/09/2020, 08/29/2021   Influenza, Seasonal, Injecte, Preservative Fre 09/02/2014, 07/12/2023   Influenza,inj,Quad PF,6+ Mos 07/10/2013, 07/24/2016, 07/19/2019   PFIZER(Purple Top)SARS-COV-2 Vaccination 12/19/2019, 01/10/2020, 03/29/2020, 09/09/2020   Tdap 04/04/2017   Patient Active Problem List   Diagnosis Date Noted   Diabetes mellitus type 2, diet-controlled (HCC) 12/06/2020    Priority: High   Mild persistent asthma, uncomplicated 12/06/2020    Priority: Medium    Generalized anxiety disorder 09/02/2014    Priority: Medium    Major depressive disorder, recurrent episode, in partial remission (HCC) 05/18/2014    Priority: Medium    Personal history of drug dependence (HCC) 12/24/2012    Priority: Medium    Migraine headache 12/24/2012    Priority: Medium    Personal history of alcoholism (HCC) 10/30/2008    Priority: Medium    IBS 12/13/2009   Hyperlipidemia 11/08/2009   Attention deficit disorder 11/08/2009   Allergic rhinitis 11/01/2008   NEPHROLITHIASIS, HX OF 11/01/2008   GENITAL HERPES 10/30/2008    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HCC)    adderrall, Xanax, MJ, others, no IV   Hyperlipidemia    IBS (irritable bowel syndrome)    Migraine headache 12/24/2012  Nephrolithiasis     Past Surgical History:  Procedure Laterality Date   Basket Surgery  2005, 06   kidney stones   TYMPANOSTOMY TUBE PLACEMENT Bilateral    as a child    Family History  Problem Relation Age of Onset   Stroke Maternal Grandmother    Cancer Maternal Grandfather    Heart disease Paternal Grandfather    Alcohol abuse Other        GP   Breast cancer Cousin    Stroke Other        GGM    Social History   Social History Narrative   Not on file    Past Medical History, Surgical History,  Social History, Family History, Problem List, Medications, and Allergies have been reviewed and updated if relevant.  Review of Systems: Pertinent positives are listed above.  Otherwise, a full 14 point review of systems has been done in full and it is negative except where it is noted positive.  Objective:   BP 90/62 (BP Location: Right Arm, Patient Position: Sitting, Cuff Size: Large)   Pulse 91   Temp 99.1 F (37.3 C) (Temporal)   Ht 5' 0.75" (1.543 m)   Wt 173 lb (78.5 kg)   LMP 06/24/2023   SpO2 96%   BMI 32.96 kg/m  Ideal Body Weight: Weight in (lb) to have BMI = 25: 131 No results found.    07/12/2023    3:59 PM 04/02/2023   11:34 AM 12/06/2022   10:34 AM 07/27/2022    3:46 PM 05/05/2022    9:28 AM  Depression screen PHQ 2/9  Decreased Interest 1 1 1  0   Down, Depressed, Hopeless 0 1 1 0   PHQ - 2 Score 1 2 2  0   Altered sleeping 0 1 1 1    Tired, decreased energy 1 1 1 1    Change in appetite 0 1 1 0   Feeling bad or failure about yourself  0 0 0 0   Trouble concentrating 0 1 1 1    Moving slowly or fidgety/restless 0 0 1 0   Suicidal thoughts 0 0 0 0   PHQ-9 Score 2 6 7 3    Difficult doing work/chores Somewhat difficult Somewhat difficult Somewhat difficult Somewhat difficult      Information is confidential and restricted. Go to Review Flowsheets to unlock data.     GEN: well developed, well nourished, no acute distress Eyes: conjunctiva and lids normal, PERRLA, EOMI ENT: TM clear, nares clear, oral exam WNL Neck: supple, no lymphadenopathy, no thyromegaly, no JVD Pulm: clear to auscultation and percussion, respiratory effort normal CV: regular rate and rhythm, S1-S2, no murmur, rub or gallop, no bruits Chest: no scars, masses, no lumps BREAST: breast exam declined GI: soft, non-tender; no hepatosplenomegaly, masses; active bowel sounds all quadrants GU: GU exam declined Lymph: no cervical, axillary or inguinal adenopathy MSK: gait normal, muscle tone and  strength WNL, no joint swelling, effusions, discoloration, crepitus  SKIN: clear, good turgor, color WNL, no rashes, lesions, or ulcerations Neuro: normal mental status, normal strength, sensation, and motion Psych: alert; oriented to person, place and time, normally interactive and not anxious or depressed in appearance.   All labs reviewed with patient. Results for orders placed or performed in visit on 06/21/23  VITAMIN D 25 Hydroxy (Vit-D Deficiency, Fractures)  Result Value Ref Range   VITD 31.97 30.00 - 100.00 ng/mL  Microalbumin / creatinine urine ratio  Result Value Ref Range   Microalb, Ur  1.4 0.0 - 1.9 mg/dL   Creatinine,U 161.0 mg/dL   Microalb Creat Ratio 0.8 0.0 - 30.0 mg/g  Lipid panel  Result Value Ref Range   Cholesterol 137 0 - 200 mg/dL   Triglycerides 96.0 0.0 - 149.0 mg/dL   HDL 45.40 >98.11 mg/dL   VLDL 91.4 0.0 - 78.2 mg/dL   LDL Cholesterol 79 0 - 99 mg/dL   Total CHOL/HDL Ratio 3    NonHDL 93.44   Hemoglobin A1c  Result Value Ref Range   Hgb A1c MFr Bld 5.7 4.6 - 6.5 %  Hepatic function panel  Result Value Ref Range   Total Bilirubin 0.4 0.2 - 1.2 mg/dL   Bilirubin, Direct 0.1 0.0 - 0.3 mg/dL   Alkaline Phosphatase 66 39 - 117 U/L   AST 14 0 - 37 U/L   ALT 14 0 - 35 U/L   Total Protein 6.7 6.0 - 8.3 g/dL   Albumin 3.9 3.5 - 5.2 g/dL  CBC with Differential/Platelet  Result Value Ref Range   WBC 9.0 4.0 - 10.5 K/uL   RBC 4.71 3.87 - 5.11 Mil/uL   Hemoglobin 14.4 12.0 - 15.0 g/dL   HCT 95.6 21.3 - 08.6 %   MCV 93.3 78.0 - 100.0 fl   MCHC 32.7 30.0 - 36.0 g/dL   RDW 57.8 46.9 - 62.9 %   Platelets 268.0 150.0 - 400.0 K/uL   Neutrophils Relative % 61.3 43.0 - 77.0 %   Lymphocytes Relative 25.5 12.0 - 46.0 %   Monocytes Relative 9.3 3.0 - 12.0 %   Eosinophils Relative 3.4 0.0 - 5.0 %   Basophils Relative 0.5 0.0 - 3.0 %   Neutro Abs 5.5 1.4 - 7.7 K/uL   Lymphs Abs 2.3 0.7 - 4.0 K/uL   Monocytes Absolute 0.8 0.1 - 1.0 K/uL   Eosinophils Absolute  0.3 0.0 - 0.7 K/uL   Basophils Absolute 0.0 0.0 - 0.1 K/uL  Basic metabolic panel  Result Value Ref Range   Sodium 138 135 - 145 mEq/L   Potassium 4.3 3.5 - 5.1 mEq/L   Chloride 105 96 - 112 mEq/L   CO2 26 19 - 32 mEq/L   Glucose, Bld 93 70 - 99 mg/dL   BUN 9 6 - 23 mg/dL   Creatinine, Ser 5.28 0.40 - 1.20 mg/dL   GFR 41.32 >44.01 mL/min   Calcium 9.0 8.4 - 10.5 mg/dL   No results found.  Assessment and Plan:     ICD-10-CM   1. Healthcare maintenance  Z00.00     2. Screen for colon cancer  Z12.11 Ambulatory referral to Gastroenterology    3. Encounter for immunization  Z23 Flu vaccine trivalent PF, 6mos and older(Flulaval,Afluria,Fluarix,Fluzone)     She is basically doing really well.   She will update other vaccines at her pharmacy as above  30+ pound weight loss with great DM control on Ozempic.   F/u 1 year  Health Maintenance Exam: The patient's preventative maintenance and recommended screening tests for an annual wellness exam were reviewed in full today. Brought up to date unless services declined.  Counselled on the importance of diet, exercise, and its role in overall health and mortality. The patient's FH and SH was reviewed, including their home life, tobacco status, and drug and alcohol status.  Follow-up in 1 year for physical exam or additional follow-up below.  Disposition: No follow-ups on file.  No future appointments.   No orders of the defined types were placed in this  encounter.  Medications Discontinued During This Encounter  Medication Reason   nirmatrelvir & ritonavir (PAXLOVID, 300/100,) 20 x 150 MG & 10 x 100MG  TBPK Completed Course   Orders Placed This Encounter  Procedures   Flu vaccine trivalent PF, 6mos and older(Flulaval,Afluria,Fluarix,Fluzone)   Ambulatory referral to Gastroenterology    Signed,  Karleen Hampshire T. Nalaya Wojdyla, MD   Allergies as of 07/12/2023       Reactions   Sulfamethoxazole-trimethoprim    REACTION: itching    Sulfa Antibiotics Rash        Medication List        Accurate as of July 12, 2023 11:59 PM. If you have any questions, ask your nurse or doctor.          STOP taking these medications    Paxlovid (300/100) 20 x 150 MG & 10 x 100MG  Tbpk Generic drug: nirmatrelvir & ritonavir Stopped by: Karleen Hampshire Morris Longenecker       TAKE these medications    albuterol 108 (90 Base) MCG/ACT inhaler Commonly known as: VENTOLIN HFA Inhale 1-2 puffs into the lungs every 6 (six) hours as needed for wheezing or shortness of breath.   atomoxetine 40 MG capsule Commonly known as: STRATTERA Take 1 capsule (40 mg total) by mouth daily.   atomoxetine 60 MG capsule Commonly known as: STRATTERA Take 1 capsule (60 mg total) by mouth daily.   busPIRone 5 MG tablet Commonly known as: BUSPAR Take 1 tablet (5 mg total) by mouth 3 (three) times daily as needed.   chlorproMAZINE 25 MG tablet Commonly known as: THORAZINE Take 25 mg by mouth daily as needed.   EPINEPHrine 0.3 mg/0.3 mL Soaj injection Commonly known as: EPI-PEN USE UTD PRF SYSTEMATIC REACTION.   escitalopram 20 MG tablet Commonly known as: LEXAPRO Take 1 tablet (20 mg total) by mouth daily.   hydrOXYzine 25 MG tablet Commonly known as: ATARAX Take 25 mg by mouth daily as needed for itching.   hyoscyamine 0.125 MG SL tablet Commonly known as: LEVSIN SL Place 1 tablet (0.125 mg total) under the tongue every 4 (four) hours as needed.   ketorolac 10 MG tablet Commonly known as: TORADOL Take 1 tablet (10 mg total) by mouth every 6 (six) hours as needed for moderate pain or severe pain.   levocetirizine 5 MG tablet Commonly known as: XYZAL Take 1 tablet (5 mg total) by mouth every evening.   lidocaine-prilocaine cream Commonly known as: EMLA APPLY TOPICALLY 4 (FOUR) TIMES DAILY AS NEEDED. FOUR TIMES A DAY TO AFFECTED AREA, 1 MONTH SUPPLY   Pazeo 0.7 % Soln Generic drug: Olopatadine HCl   Semaglutide (1 MG/DOSE) 4 MG/3ML  Sopn Inject 1 mg as directed once a week.   SUMAtriptan 100 MG tablet Commonly known as: IMITREX Take by mouth.   tiZANidine 4 MG tablet Commonly known as: ZANAFLEX TAKE 1 TABLET BY MOUTH EVERYDAY AT BEDTIME   triamcinolone cream 0.1 % Commonly known as: KENALOG Apply 1 application topically 2 (two) times daily.   valACYclovir 500 MG tablet Commonly known as: VALTREX Take 1 tablet (500 mg total) by mouth 2 (two) times daily. For 3 days during a flare

## 2023-07-12 ENCOUNTER — Encounter: Payer: Self-pay | Admitting: Family Medicine

## 2023-07-12 ENCOUNTER — Ambulatory Visit (INDEPENDENT_AMBULATORY_CARE_PROVIDER_SITE_OTHER): Payer: BC Managed Care – PPO | Admitting: Family Medicine

## 2023-07-12 VITALS — BP 90/62 | HR 91 | Temp 99.1°F | Ht 60.75 in | Wt 173.0 lb

## 2023-07-12 DIAGNOSIS — Z0000 Encounter for general adult medical examination without abnormal findings: Secondary | ICD-10-CM | POA: Diagnosis not present

## 2023-07-12 DIAGNOSIS — Z23 Encounter for immunization: Secondary | ICD-10-CM

## 2023-07-12 DIAGNOSIS — Z1211 Encounter for screening for malignant neoplasm of colon: Secondary | ICD-10-CM

## 2023-07-12 NOTE — Patient Instructions (Signed)
You do not need a referral to make a mammogram appointment, and you may call to make her own mammogram appointment directly around your schedule.  [facility] [place] [phone] [address] [place], [place] [address]  [facility] ([facility]) [address] [place], [place] [address] Phone: [phone] Toll Free: [phone]  MAMMOGRAPHY IN [place]:  [facility] or [place]) [phone] Located on the campus of [facility])  [facility] Location) [address]  [place], [place] [address]

## 2023-07-15 ENCOUNTER — Encounter: Payer: Self-pay | Admitting: Emergency Medicine

## 2023-07-15 ENCOUNTER — Other Ambulatory Visit: Payer: Self-pay

## 2023-07-15 ENCOUNTER — Ambulatory Visit
Admission: EM | Admit: 2023-07-15 | Discharge: 2023-07-15 | Disposition: A | Discharge status: Home / Self Care | Payer: BC Managed Care – PPO | Attending: Family Medicine | Admitting: Family Medicine

## 2023-07-15 DIAGNOSIS — J0100 Acute maxillary sinusitis, unspecified: Secondary | ICD-10-CM | POA: Diagnosis not present

## 2023-07-15 DIAGNOSIS — R059 Cough, unspecified: Secondary | ICD-10-CM

## 2023-07-15 MED ORDER — AMOXICILLIN-POT CLAVULANATE 875-125 MG PO TABS: 1.0000 | ORAL_TABLET | Freq: Two times a day (BID) | ORAL | 0 refills | Status: AC

## 2023-07-15 MED ORDER — PREDNISONE 10 MG (21) PO TBPK: ORAL_TABLET | Freq: Every day | ORAL | 0 refills | Status: DC

## 2023-07-15 NOTE — Discharge Instructions (Addendum)
Advised patient to take medications as directed with food to completion.  Advised patient to take prednisone with first dose of Augmentin for the next 10 days.  Encouraged increase daily water intake to 64 ounces per day.  Advised if symptoms worsen and/or unresolved please follow-up with PCP or here for further evaluation.

## 2023-07-15 NOTE — ED Provider Notes (Signed)
[facility]    CSN: [id number] Arrival date & time: 07/15/23  0804      History   Chief Complaint Chief Complaint  Patient presents with   Nasal Congestion    HPI Candice Mcgrath is a 47 y.o. female.   HPI 47 year old female presents with cough, congestion, runny nose PMH significant for obesity, IBS, and migraine headache  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HCC)    adderrall, Xanax, MJ, others, no IV   Hyperlipidemia    IBS (irritable bowel syndrome)    Migraine headache 12/24/2012   Nephrolithiasis     Patient Active Problem List   Diagnosis Date Noted   Mild persistent asthma, uncomplicated 12/06/2020   Diabetes mellitus type 2, diet-controlled (HCC) 12/06/2020   Generalized anxiety disorder 09/02/2014   Major depressive disorder, recurrent episode, in partial remission (HCC) 05/18/2014   Personal history of drug dependence (HCC) 12/24/2012   Migraine headache 12/24/2012   IBS 12/13/2009   Hyperlipidemia 11/08/2009   Attention deficit disorder 11/08/2009   Allergic rhinitis 11/01/2008   NEPHROLITHIASIS, HX OF 11/01/2008   GENITAL HERPES 10/30/2008   Personal history of alcoholism (HCC) 10/30/2008    Past Surgical History:  Procedure Laterality Date   Basket Surgery  2005, 06   kidney stones   TYMPANOSTOMY TUBE PLACEMENT Bilateral    as a child    OB History   No obstetric history on file.      Home Medications    Prior to Admission medications   Medication Sig Start Date End Date Taking? Authorizing Provider  albuterol (VENTOLIN HFA) 108 (90 Base) MCG/ACT inhaler Inhale 1-2 puffs into the lungs every 6 (six) hours as needed for wheezing or shortness of breath. 06/24/23   Eustace Moore, MD  amoxicillin-clavulanate (AUGMENTIN) 875-125 MG tablet Take 1 tablet by mouth 2 (two) times daily  for 10 days. 07/15/23 07/25/23 Yes Trevor Iha, FNP  atomoxetine (STRATTERA) 40 MG capsule Take 1 capsule (40 mg total) by mouth daily. 07/09/23   Arfeen, Phillips Grout, MD  atomoxetine (STRATTERA) 60 MG capsule Take 1 capsule (60 mg total) by mouth daily. 07/09/23   Arfeen, Phillips Grout, MD  busPIRone (BUSPAR) 5 MG tablet Take 1 tablet (5 mg total) by mouth 3 (three) times daily as needed. 07/09/23   Arfeen, Phillips Grout, MD  chlorproMAZINE (THORAZINE) 25 MG tablet Take 25 mg by mouth daily as needed. 10/07/20   [provider]  EPINEPHrine 0.3 mg/0.3 mL IJ SOAJ injection USE UTD PRF SYSTEMATIC REACTION. 04/06/17   [provider]  escitalopram (LEXAPRO) 20 MG tablet Take 1 tablet (20 mg total) by mouth daily. 07/09/23   Arfeen, Phillips Grout, MD  hydrOXYzine (ATARAX) 25 MG tablet Take 25 mg by mouth daily as needed for itching. 07/05/23   [provider]  hyoscyamine (LEVSIN SL) 0.125 MG SL tablet Place 1 tablet (0.125 mg total) under the tongue every 4 (four) hours as needed. 08/10/20   Copland, Karleen Hampshire, MD  ketorolac (TORADOL) 10 MG tablet Take 1 tablet (10 mg total) by mouth every 6 (six) hours as needed for moderate pain or severe pain. 09/14/21   Copland, Karleen Hampshire, MD  levocetirizine (XYZAL) 5 MG tablet Take 1 tablet (5 mg total) by mouth every evening. 06/04/23   [name], MD  lidocaine-prilocaine (  EMLA) cream APPLY TOPICALLY 4 (FOUR) TIMES DAILY AS NEEDED. FOUR TIMES A DAY TO AFFECTED AREA, 1 MONTH SUPPLY 08/10/20   Copland, Karleen Hampshire, MD  PAZEO 0.7 % SOLN  04/06/17   [provider]  predniSONE (STERAPRED UNI-PAK 21 TAB) 10 MG (21) TBPK tablet Take by mouth daily. Take 6 tabs by mouth daily  for 2 days, then 5 tabs for 2 days, then 4 tabs for 2 days, then 3 tabs for 2 days, 2 tabs for 2 days, then 1 tab by mouth daily for 2 days 07/15/23  Yes Trevor Iha, FNP  Semaglutide, 1 MG/DOSE, 4 MG/3ML SOPN Inject 1 mg as directed once a week. 07/27/22   Copland, Karleen Hampshire, MD  SUMAtriptan  (IMITREX) 100 MG tablet Take by mouth. 11/25/20   [provider]  tiZANidine (ZANAFLEX) 4 MG tablet TAKE 1 TABLET BY MOUTH EVERYDAY AT BEDTIME 06/11/23   Copland, Karleen Hampshire, MD  triamcinolone cream (KENALOG) 0.1 % Apply 1 application topically 2 (two) times daily. 05/07/21   Demetrio Lapping, PA-C  valACYclovir (VALTREX) 500 MG tablet Take 1 tablet (500 mg total) by mouth 2 (two) times daily. For 3 days during a flare 04/02/23   Copland, Karleen Hampshire, MD    Family History Family History  Problem Relation Age of Onset   Stroke Maternal Grandmother    Cancer Maternal Grandfather    Heart disease Paternal Grandfather    Alcohol abuse Other        GP   Breast cancer Cousin    Stroke Other        GGM    Social History Social History   Tobacco Use   Smoking status: Every Day    Current packs/day: 0.00    Types: Cigarettes    Last attempt to quit: 06/24/2016    Years since quitting: 7.0   Smokeless tobacco: Former    Quit date: 12/04/2008  Substance Use Topics   Alcohol use: Not Currently    Alcohol/week: 2.0 - 4.0 standard drinks of alcohol    Types: 2 - 4 Cans of beer per week    Comment: recovering AA   Drug use: No    Comment: Recovering NA     Allergies   Sulfamethoxazole-trimethoprim and Sulfa antibiotics   Review of Systems Review of Systems  HENT:  Positive for congestion and postnasal drip.   Respiratory:  Positive for cough.   All other systems reviewed and are negative.    Physical Exam Triage Vital Signs ED Triage Vitals  Encounter Vitals Group     BP      Systolic BP Percentile      Diastolic BP Percentile      Pulse      Resp      Temp      Temp src      SpO2      Weight      Height      Head Circumference      Peak Flow      Pain Score      Pain Loc      Pain Education      Exclude from Growth Chart    No data found.  Updated Vital Signs BP 136/81 (BP Location: Right Arm)   Pulse 88   Temp 97.8 F (36.6 C) (Oral)   Resp 16   LMP  06/24/2023   SpO2 99%    Physical Exam Vitals and nursing note reviewed.  Constitutional:  Appearance: Normal appearance. She is obese.  HENT:     Head: Normocephalic and atraumatic.     Right Ear: Tympanic membrane and external ear normal.     Left Ear: Tympanic membrane and external ear normal.     Ears:     Comments: Significant eustachian tube dysfunction noted bilaterally    Nose:     Comments: Turbinates are erythematous/edematous    Mouth/Throat:     Mouth: Mucous membranes are moist.     Pharynx: Oropharynx is clear.     Comments: Moderate amount of clear drainage of posterior oropharynx noted Eyes:     Extraocular Movements: Extraocular movements intact.     Conjunctiva/sclera: Conjunctivae normal.     Pupils: Pupils are equal, round, and reactive to light.  Cardiovascular:     Rate and Rhythm: Normal rate and regular rhythm.     Pulses: Normal pulses.     Heart sounds: Normal heart sounds.  Pulmonary:     Effort: Pulmonary effort is normal.     Breath sounds: Normal breath sounds. No wheezing, rhonchi or rales.     Comments: Infrequent nonproductive cough noted on exam Musculoskeletal:        General: Normal range of motion.     Cervical back: Normal range of motion and neck supple.  Skin:    General: Skin is warm and dry.  Neurological:     General: No focal deficit present.     Mental Status: She is alert and oriented to person, place, and time.  Psychiatric:        Mood and Affect: Mood normal.        Behavior: Behavior normal.        Thought Content: Thought content normal.      UC Treatments / Results  Labs (all labs ordered are listed, but only abnormal results are displayed) Labs Reviewed - No data to display  EKG   Radiology No results found.  Procedures Procedures (including critical care time)  Medications Ordered in UC Medications - No data to display  Initial Impression / Assessment and Plan / UC Course  I have reviewed the  triage vital signs and the nursing notes.  Pertinent labs & imaging results that were available during my care of the patient were reviewed by me and considered in my medical decision making (see chart for details).     MDM: 1.  Acute maxillary sinusitis, recurrence not specified-Rx'd Augmentin 875/125 mg tablet twice daily x 10 days; 2.  Cough, unspecified type-Rx'd Sterapred Unipak (tapering from 60 mg to 10 mg over 10 days). Advised patient to take medications as directed with food to completion.  Advised patient to take prednisone with first dose of Augmentin for the next 10 days.  Encouraged increase daily water intake to 64 ounces per day.  Advised if symptoms worsen and/or unresolved please follow-up with PCP or here for further evaluation.  Patient discharged home, hemodynamically stable.  Final Clinical Impressions(s) / UC Diagnoses   Final diagnoses:  Cough, unspecified type  Acute maxillary sinusitis, recurrence not specified     Discharge Instructions      Advised patient to take medications as directed with food to completion.  Advised patient to take prednisone with first dose of Augmentin for the next 10 days.  Encouraged increase daily water intake to 64 ounces per day.  Advised if symptoms worsen and/or unresolved please follow-up with PCP or here for further evaluation.     ED Prescriptions  Medication Sig Dispense Auth. Provider   amoxicillin-clavulanate (AUGMENTIN) 875-125 MG tablet Take 1 tablet by mouth 2 (two) times daily for 10 days. 20 tablet Trevor Iha, FNP   predniSONE (STERAPRED UNI-PAK 21 TAB) 10 MG (21) TBPK tablet Take by mouth daily. Take 6 tabs by mouth daily  for 2 days, then 5 tabs for 2 days, then 4 tabs for 2 days, then 3 tabs for 2 days, 2 tabs for 2 days, then 1 tab by mouth daily for 2 days 42 tablet Trevor Iha, FNP      PDMP not reviewed this encounter.   Trevor Iha, FNP 07/15/23 [phone]

## 2023-07-15 NOTE — ED Triage Notes (Signed)
Pt was seen 9/1 and treated for covid. States symptoms improved but x3 days ago she started having congestion and cough. States symptoms are getting worse. Using OTC meds with no relief.

## 2023-07-27 ENCOUNTER — Other Ambulatory Visit: Payer: Self-pay | Admitting: Family Medicine

## 2023-07-27 NOTE — Telephone Encounter (Signed)
Ozempic Last filled:  05/01/23, #9 mL Last OV:  07/12/23, CPE Next OV:  none

## 2023-08-26 NOTE — Progress Notes (Unsigned)
    [name] [name], MD, CAQ Sports Medicine [facility] at [facility] [address] [place], [address]  Phone: [phone]  FAX: [phone]  Candice Mcgrath - 47 y.o. female  MRN [id number]  Date of Birth: 06-21-1976  Date: 08/27/2023  PCP: [name], MD  Referral: [name], MD  No chief complaint on file.  Subjective:   Candice Mcgrath is a 47 y.o. very pleasant female patient with There is no height or weight on file to calculate BMI. who presents with the following:  Candice Mcgrath is very well-known for many years.  She presents with some paperwork for smoking and tobacco use.    Review of Systems is noted in the HPI, as appropriate  Objective:   There were no vitals taken for this visit.  GEN: No acute distress; alert,appropriate. PULM: Breathing comfortably in no respiratory distress PSYCH: Normally interactive.   Laboratory and Imaging Data:  Assessment and Plan:   ***

## 2023-08-27 ENCOUNTER — Encounter: Payer: Self-pay | Admitting: Family Medicine

## 2023-08-27 ENCOUNTER — Ambulatory Visit (INDEPENDENT_AMBULATORY_CARE_PROVIDER_SITE_OTHER): Payer: BC Managed Care – PPO | Admitting: Family Medicine

## 2023-08-27 VITALS — BP 90/60 | HR 102 | Temp 98.7°F | Ht 60.75 in | Wt 174.0 lb

## 2023-08-27 DIAGNOSIS — Z716 Tobacco abuse counseling: Secondary | ICD-10-CM

## 2023-08-27 DIAGNOSIS — Z23 Encounter for immunization: Secondary | ICD-10-CM

## 2023-08-27 NOTE — Addendum Note (Signed)
Addended by: Damita Lack on: 08/27/2023 02:42 PM   Modules accepted: Orders

## 2023-08-28 ENCOUNTER — Encounter: Payer: Self-pay | Admitting: Internal Medicine

## 2023-08-31 ENCOUNTER — Ambulatory Visit (AMBULATORY_SURGERY_CENTER): Payer: BC Managed Care – PPO | Admitting: *Deleted

## 2023-08-31 ENCOUNTER — Encounter: Payer: Self-pay | Admitting: Internal Medicine

## 2023-08-31 VITALS — Ht 61.0 in | Wt 170.0 lb

## 2023-08-31 DIAGNOSIS — Z1211 Encounter for screening for malignant neoplasm of colon: Secondary | ICD-10-CM

## 2023-08-31 MED ORDER — NA SULFATE-K SULFATE-MG SULF 17.5-3.13-1.6 GM/177ML PO SOLN: 1.0000 | Freq: Once | ORAL | 0 refills | Status: AC

## 2023-08-31 NOTE — Progress Notes (Addendum)
Pt's name and DOB verified at the beginning of the pre-visit wit 2 identifiers  Pt denies any difficulty with ambulating,sitting, laying down or rolling side to side  Pt has issues with ambulation   Pt has no issues moving head neck or swallowing  No egg or soy allergy known to patient   No issues known to pt with past sedation with any surgeries or procedures  Pt denies having issues being intubated  No FH of Malignant Hyperthermia  Pt is on diet shots Ozempic instructed not to take for 7 days prior to procedure Pt stated understood  Pt is not on home 02   Pt is not on blood thinners   Pt has frequent issues with constipation RN instructed pt to use Miralax per bottles instructions a week before prep days. Pt states they will  Pt is not on dialysis  Pt denise any abnormal heart rhythms   Pt denies any upcoming cardiac testing  Pt encouraged to use to use Singlecare or Goodrx to reduce cost   Patient's chart reviewed by Cathlyn Parsons CNRA prior to pre-visit and patient appropriate for the [facility].  Pre-visit completed and red dot placed by patient's name on their procedure day (on provider's schedule).  .  Visit by phone  Pt states weight is 170 lb Pt has hx of ETOH abuse and Pot and Adderall abuse but is out of rehab and doing well per pt  Instructed pt why it is important to and  to call if they have any changes in health or new medications. Directed them to the # given and on instructions.     Instructions reviewed. Pt given both [facility] main # and MD on call # prior to instructions.  Pt states understanding. Instructed to review again prior to procedure. Pt states they will.   Instructions sent by mail   Instructions sent through My Chart  Coupon sent via text to mobile phone and pt verified they received it

## 2023-09-07 ENCOUNTER — Ambulatory Visit: Payer: BC Managed Care – PPO | Admitting: Internal Medicine

## 2023-09-07 ENCOUNTER — Encounter: Payer: Self-pay | Admitting: Internal Medicine

## 2023-09-07 VITALS — BP 101/64 | HR 78 | Temp 98.2°F | Resp 15 | Ht 61.0 in | Wt 170.0 lb

## 2023-09-07 DIAGNOSIS — D128 Benign neoplasm of rectum: Secondary | ICD-10-CM

## 2023-09-07 DIAGNOSIS — Z1211 Encounter for screening for malignant neoplasm of colon: Secondary | ICD-10-CM | POA: Diagnosis present

## 2023-09-07 MED ORDER — SODIUM CHLORIDE 0.9 % IV SOLN: 500.0000 mL | Freq: Once | INTRAVENOUS | Status: DC

## 2023-09-07 NOTE — Progress Notes (Signed)
Pt's states no medical or surgical changes since previsit or office visit. 

## 2023-09-07 NOTE — Progress Notes (Signed)
Report to PACU, RN, vss, BBS= Clear.  

## 2023-09-07 NOTE — Op Note (Signed)
[facility] Patient Name: Candice Mcgrath Procedure Date: 09/07/2023 11:23 AM MRN: [id number] Endoscopist: Wilhemina Bonito. Marina Goodell , MD, [id number] Age: 47 Referring MD:  Date of Birth: 03-23-1976 Gender: Female Account #: [id number] Procedure:                Colonoscopy with cold snare polypectomy x 1 Indications:              Screening for colorectal malignant neoplasm Medicines:                Monitored Anesthesia Care Procedure:                Pre-Anesthesia Assessment:                           - Prior to the procedure, a History and Physical                            was performed, and patient medications and                            allergies were reviewed. The patient's tolerance of                            previous anesthesia was also reviewed. The risks                            and benefits of the procedure and the sedation                            options and risks were discussed with the patient.                            All questions were answered, and informed consent                            was obtained. Prior Anticoagulants: The patient has                            taken no anticoagulant or antiplatelet agents.                            After reviewing the risks and benefits, the patient                            was deemed in satisfactory condition to undergo the                            procedure.                           After obtaining informed consent, the colonoscope                            was passed under direct vision. Throughout the  procedure, the patient's blood pressure, pulse, and                            oxygen saturations were monitored continuously. The                            Olympus Scope [id number] was introduced through the                            anus and advanced to the the cecum, identified by                            appendiceal orifice and ileocecal valve. The                             ileocecal valve, appendiceal orifice, and rectum                            were photographed. The quality of the bowel                            preparation was excellent. The colonoscopy was                            performed without difficulty. The patient tolerated                            the procedure well. The bowel preparation used was                            SUPREP via split dose instruction. Scope In: 11:44:25 AM Scope Out: 11:57:18 AM Scope Withdrawal Time: 0 hours 10 minutes 21 seconds  Total Procedure Duration: 0 hours 12 minutes 53 seconds  Findings:                 An 8 mm polyp was found in the rectum. The polyp                            was pedunculated. The polyp was removed with a cold                            snare. Resection and retrieval were complete.                           The exam was otherwise without abnormality. Rectal                            vault too narrow for retroflexion. Excellent view                            of distal rectum from anal os photo documented. Complications:            No immediate complications. Estimated blood loss:  None. Estimated Blood Loss:     Estimated blood loss: none. Impression:               - One 8 mm polyp in the rectum, removed with a cold                            snare. Resected and retrieved.                           - The examination was otherwise normal . Recommendation:           - Repeat colonoscopy in 3 years for surveillance if                            polyp has villous component, otherwise 7 years.                           - Patient has a contact number available for                            emergencies. The signs and symptoms of potential                            delayed complications were discussed with the                            patient. Return to normal activities tomorrow.                            Written discharge instructions were provided to the                             patient.                           - Resume previous diet.                           - Continue present medications.                           - Await pathology results. Wilhemina Bonito. Marina Goodell, MD 09/07/2023 12:03:29 PM This report has been signed electronically.

## 2023-09-07 NOTE — Progress Notes (Signed)
HISTORY OF PRESENT ILLNESS:  Candice Mcgrath is a 47 y.o. female who is sent today for screening colonoscopy.  No complaints  REVIEW OF SYSTEMS:  All non-GI ROS negative except for  Past Medical History:  Diagnosis Date   Allergic rhinitis    Anxiety    Depression    Herpes    History of alcohol abuse    initially sober [facility], [facility]   History of drug abuse in remission (HCC)    adderrall, Xanax, MJ, others, no IV   Hyperlipidemia    IBS (irritable bowel syndrome)    Migraine headache 12/24/2012   Nephrolithiasis    Substance abuse (HCC)    ETOH, Aderall pot    Past Surgical History:  Procedure Laterality Date   Basket Surgery  2005, 06   kidney stones   TYMPANOSTOMY TUBE PLACEMENT Bilateral    as a child    Social History Candice Mcgrath  reports that she has been smoking cigarettes. She quit smokeless tobacco use about 14 years ago. She reports that she does not currently use alcohol after a past usage of about 2.0 - 4.0 standard drinks of alcohol per week. She reports that she does not use drugs.  family history includes Alcohol abuse in an other family member; Breast cancer in her cousin; Cancer in her maternal grandfather; Colon polyps in her mother; Heart disease in her paternal grandfather; Stroke in her maternal grandmother and another family member.  Allergies  Allergen Reactions   Sulfamethoxazole-Trimethoprim     REACTION: itching   Sulfa Antibiotics Rash       PHYSICAL EXAMINATION: Vital signs: BP 103/70   Pulse 83   Temp 98.2 F (36.8 C)   Ht 5\' 1"  (1.549 m)   Wt 170 lb (77.1 kg)   LMP 08/17/2023   SpO2 96%   BMI 32.12 kg/m  General: Well-developed, well-nourished, no acute distress HEENT: Sclerae are anicteric, conjunctiva pink. Oral mucosa intact Lungs: Clear Heart: Regular Abdomen: soft, nontender, nondistended, no obvious ascites, no peritoneal signs, normal bowel sounds. No organomegaly. Extremities: No  edema Psychiatric: alert and oriented x3. Cooperative     ASSESSMENT:  Colon cancer screening   PLAN:  Screening colonoscopy

## 2023-09-07 NOTE — Progress Notes (Signed)
Called to room to assist during endoscopic procedure.  Patient ID and intended procedure confirmed with present staff. Received instructions for my participation in the procedure from the performing physician.  

## 2023-09-07 NOTE — Patient Instructions (Signed)
Discharge instructions given. Handout on polyps. Resume previous medications. YOU HAD AN ENDOSCOPIC PROCEDURE TODAY AT THE [facility]:   Refer to the procedure report that was given to you for any specific questions about what was found during the examination.  If the procedure report does not answer your questions, please call your gastroenterologist to clarify.  If you requested that your care partner not be given the details of your procedure findings, then the procedure report has been included in a sealed envelope for you to review at your convenience later.  YOU SHOULD EXPECT: Some feelings of bloating in the abdomen. Passage of more gas than usual.  Walking can help get rid of the air that was put into your GI tract during the procedure and reduce the bloating. If you had a lower endoscopy (such as a colonoscopy or flexible sigmoidoscopy) you may notice spotting of blood in your stool or on the toilet paper. If you underwent a bowel prep for your procedure, you may not have a normal bowel movement for a few days.  Please Note:  You might notice some irritation and congestion in your nose or some drainage.  This is from the oxygen used during your procedure.  There is no need for concern and it should clear up in a day or so.  SYMPTOMS TO REPORT IMMEDIATELY:  Following lower endoscopy (colonoscopy or flexible sigmoidoscopy):  Excessive amounts of blood in the stool  Significant tenderness or worsening of abdominal pains  Swelling of the abdomen that is new, acute  Fever of 100F or higher   For urgent or emergent issues, a gastroenterologist can be reached at any hour by calling [phone]. Do not use MyChart messaging for urgent concerns.    DIET:  We do recommend a small meal at first, but then you may proceed to your regular diet.  Drink plenty of fluids but you should avoid alcoholic beverages for 24 hours.  ACTIVITY:  You should plan to take it easy for the rest  of today and you should NOT DRIVE or use heavy machinery until tomorrow (because of the sedation medicines used during the test).    FOLLOW UP: Our staff will call the number listed on your records the next business day following your procedure.  We will call around 7:15- 8:00 am to check on you and address any questions or concerns that you may have regarding the information given to you following your procedure. If we do not reach you, we will leave a message.     If any biopsies were taken you will be contacted by phone or by letter within the next 1-3 weeks.  Please call us at [phone] if you have not heard about the biopsies in 3 weeks.    SIGNATURES/CONFIDENTIALITY: You and/or your care partner have signed paperwork which will be entered into your electronic medical record.  These signatures attest to the fact that that the information above on your After Visit Summary has been reviewed and is understood.  Full responsibility of the confidentiality of this discharge information lies with you and/or your care-partner. 

## 2023-09-10 ENCOUNTER — Telehealth: Payer: Self-pay | Admitting: *Deleted

## 2023-09-10 NOTE — Telephone Encounter (Signed)
Left message on f/u call 

## 2023-09-11 ENCOUNTER — Encounter: Payer: Self-pay | Admitting: Internal Medicine

## 2023-09-11 LAB — SURGICAL PATHOLOGY

## 2023-09-12 ENCOUNTER — Telehealth (HOSPITAL_BASED_OUTPATIENT_CLINIC_OR_DEPARTMENT_OTHER): Payer: BC Managed Care – PPO | Admitting: Psychiatry

## 2023-09-12 ENCOUNTER — Encounter (HOSPITAL_COMMUNITY): Payer: Self-pay | Admitting: Psychiatry

## 2023-09-12 VITALS — Wt 170.0 lb

## 2023-09-12 DIAGNOSIS — F902 Attention-deficit hyperactivity disorder, combined type: Secondary | ICD-10-CM

## 2023-09-12 DIAGNOSIS — F3341 Major depressive disorder, recurrent, in partial remission: Secondary | ICD-10-CM

## 2023-09-12 DIAGNOSIS — F411 Generalized anxiety disorder: Secondary | ICD-10-CM

## 2023-09-12 MED ORDER — ATOMOXETINE HCL 40 MG PO CAPS: 40.0000 mg | ORAL_CAPSULE | Freq: Every day | ORAL | 0 refills | Status: DC

## 2023-09-12 MED ORDER — BUSPIRONE HCL 5 MG PO TABS: 5.0000 mg | ORAL_TABLET | Freq: Three times a day (TID) | ORAL | 2 refills | Status: DC | PRN

## 2023-09-12 MED ORDER — ESCITALOPRAM OXALATE 20 MG PO TABS: 20.0000 mg | ORAL_TABLET | Freq: Every day | ORAL | 0 refills | Status: DC

## 2023-09-12 MED ORDER — ATOMOXETINE HCL 60 MG PO CAPS: 60.0000 mg | ORAL_CAPSULE | Freq: Every day | ORAL | 0 refills | Status: DC

## 2023-09-12 NOTE — Progress Notes (Signed)
[facility] MD Virtual Progress Note   Patient Location: Work Provider Location: Home Office  I connect with patient by video and verified that I am speaking with correct person by using two identifiers. I discussed the limitations of evaluation and management by telemedicine and the availability of in person appointments. I also discussed with the patient that there may be a patient responsible charge related to this service. The patient expressed understanding and agreed to proceed.  Candice Mcgrath [place] [phone] 47 y.o.  09/12/2023 8:41 AM  History of Present Illness:  Patient is evaluated by video session.  She is at work.  She reported some irritability, frustration after watching the election news and resolved.  Patient told few of her family member or immigrant and they are very concerned about their status in this country.  Patient told that she had given them directions and guidance but sometimes she feel herself very upset.  She also reported having symptoms related to PMS.  1 week before her.  She feels very emotional, irritable and upset.  She also reported there are times when she sweats a lot in the morning when she wake up.  She is taking Strattera 60 mg every morning and there are days when she takes 20 mg when she has too much work in the afternoon.  She denies any major panic attack but feels disappointed with the election results.  Patient recently had a colonoscopy that went well.  Patient was told 1 precancerous polyp was removed.  She denies any crying spells or any feeling of hopelessness or worthlessness.  Her energy level is okay.  Her appetite is okay.  She is taking BuSpar, Lexapro and Strattera.  She has no tremor or shakes or any EPS.  Her weight is unchanged from the past.  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  Patient lives with her husband follows for past 10 years.  Recently she had asked her husband to have a vasectomy  which she did.  Patient has planned to celebrate Thanksgiving with her friends.  Patient will spend time Christmas with her brother and sister-in-law and she reported they have different views about the election results.  However she understand everyone has choices.  Past Psychiatric History: History of ADHD, anxiety, depression, drug and ETOH use. 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  H/O rehab in 2007.  As per chart tried Prozac and Wellbutrin but no details. No history of suicidal attempt, psychosis, mania, hallucination, PTSD, panic attacks.  For past 14 years medicines were managed by her primary care physician.     Outpatient Encounter Medications as of 09/12/2023  Medication Sig   albuterol (VENTOLIN HFA) 108 (90 Base) MCG/ACT inhaler Inhale 1-2 puffs into the lungs every 6 (six) hours as needed for wheezing or shortness of breath.   Ascorbic Acid (VITAMIN C) 100 MG tablet Take 100 mg by mouth daily.   atomoxetine (STRATTERA) 40 MG capsule Take 1 capsule (40 mg total) by mouth daily.   atomoxetine (STRATTERA) 60 MG capsule Take 1 capsule (60 mg total) by mouth daily.   Azelastine HCl 137 MCG/SPRAY SOLN Place into both nostrils.   busPIRone (BUSPAR) 5 MG tablet Take 1 tablet (5 mg total) by mouth 3 (three) times daily as needed.   chlorproMAZINE (THORAZINE) 25 MG tablet Take 25 mg by mouth daily as needed.   EPINEPHrine 0.3 mg/0.3 mL IJ SOAJ injection USE UTD PRF SYSTEMATIC REACTION.  escitalopram (LEXAPRO) 20 MG tablet Take 1 tablet (20 mg total) by mouth daily.   fluticasone (FLONASE) 50 MCG/ACT nasal spray Place into both nostrils.   hydrOXYzine (ATARAX) 25 MG tablet Take 25 mg by mouth daily as needed for itching.   hyoscyamine (LEVSIN SL) 0.125 MG SL tablet Place 1 tablet (0.125 mg total) under the tongue every 4 (four) hours as needed.   ketorolac (TORADOL) 10 MG tablet Take 1 tablet (10 mg total) by mouth every 6 (six) hours as needed for moderate pain or severe  pain.   levocetirizine (XYZAL) 5 MG tablet Take 1 tablet (5 mg total) by mouth every evening.   lidocaine-prilocaine (EMLA) cream APPLY TOPICALLY 4 (FOUR) TIMES DAILY AS NEEDED. FOUR TIMES A DAY TO AFFECTED AREA, 1 MONTH SUPPLY   Multiple Vitamins-Minerals (MULTI-VITAMIN GUMMIES PO) Take by mouth.   PAZEO 0.7 % SOLN    Semaglutide, 1 MG/DOSE, (OZEMPIC, 1 MG/DOSE,) 4 MG/3ML SOPN INJECT 1 MG ONCE A WEEK AS DIRECTED   SUMAtriptan (IMITREX) 100 MG tablet Take by mouth.   tiZANidine (ZANAFLEX) 4 MG tablet TAKE 1 TABLET BY MOUTH EVERYDAY AT BEDTIME   triamcinolone cream (KENALOG) 0.1 % Apply 1 application topically 2 (two) times daily.   valACYclovir (VALTREX) 500 MG tablet Take 1 tablet (500 mg total) by mouth 2 (two) times daily. For 3 days during a flare   No facility-administered encounter medications on file as of 09/12/2023.    Recent Results (from the past 2160 hour(s))  VITAMIN D 25 Hydroxy (Vit-D Deficiency, Fractures)     Status: None   Collection Time: 06/21/23  7:33 AM  Result Value Ref Range   VITD 31.97 30.00 - 100.00 ng/mL  Microalbumin / creatinine urine ratio     Status: None   Collection Time: 06/21/23  7:33 AM  Result Value Ref Range   Microalb, Ur 1.4 0.0 - 1.9 mg/dL   Creatinine,U 619.5 mg/dL   Microalb Creat Ratio 0.8 0.0 - 30.0 mg/g  Lipid panel     Status: None   Collection Time: 06/21/23  7:33 AM  Result Value Ref Range   Cholesterol 137 0 - 200 mg/dL    Comment: ATP III Classification       Desirable:  < 200 mg/dL               Borderline High:  200 - 239 mg/dL          High:  > = 093 mg/dL   Triglycerides 26.7 0.0 - 149.0 mg/dL    Comment: Normal:  <124 mg/dLBorderline High:  150 - 199 mg/dL   HDL 58.09 >98.33 mg/dL   VLDL 82.5 0.0 - 05.3 mg/dL   LDL Cholesterol 79 0 - 99 mg/dL   Total CHOL/HDL Ratio 3     Comment:                Men          Women1/2 Average Risk     3.4          3.3Average Risk          5.0          4.42X Average Risk          9.6           7.13X Average Risk          15.0          11.0  NonHDL 93.44     Comment: NOTE:  Non-HDL goal should be 30 mg/dL higher than patient's LDL goal (i.e. LDL goal of < 70 mg/dL, would have non-HDL goal of < 100 mg/dL)  Hemoglobin Z6X     Status: None   Collection Time: 06/21/23  7:33 AM  Result Value Ref Range   Hgb A1c MFr Bld 5.7 4.6 - 6.5 %    Comment: Glycemic Control Guidelines for People with Diabetes:Non Diabetic:  <6%Goal of Therapy: <7%Additional Action Suggested:  >8%   Hepatic function panel     Status: None   Collection Time: 06/21/23  7:33 AM  Result Value Ref Range   Total Bilirubin 0.4 0.2 - 1.2 mg/dL   Bilirubin, Direct 0.1 0.0 - 0.3 mg/dL   Alkaline Phosphatase 66 39 - 117 U/L   AST 14 0 - 37 U/L   ALT 14 0 - 35 U/L   Total Protein 6.7 6.0 - 8.3 g/dL   Albumin 3.9 3.5 - 5.2 g/dL  CBC with Differential/Platelet     Status: None   Collection Time: 06/21/23  7:33 AM  Result Value Ref Range   WBC 9.0 4.0 - 10.5 K/uL   RBC 4.71 3.87 - 5.11 Mil/uL   Hemoglobin 14.4 12.0 - 15.0 g/dL   HCT 09.6 04.5 - 40.9 %   MCV 93.3 78.0 - 100.0 fl   MCHC 32.7 30.0 - 36.0 g/dL   RDW 81.1 91.4 - 78.2 %   Platelets 268.0 150.0 - 400.0 K/uL   Neutrophils Relative % 61.3 43.0 - 77.0 %   Lymphocytes Relative 25.5 12.0 - 46.0 %   Monocytes Relative 9.3 3.0 - 12.0 %   Eosinophils Relative 3.4 0.0 - 5.0 %   Basophils Relative 0.5 0.0 - 3.0 %   Neutro Abs 5.5 1.4 - 7.7 K/uL   Lymphs Abs 2.3 0.7 - 4.0 K/uL   Monocytes Absolute 0.8 0.1 - 1.0 K/uL   Eosinophils Absolute 0.3 0.0 - 0.7 K/uL   Basophils Absolute 0.0 0.0 - 0.1 K/uL  Basic metabolic panel     Status: None   Collection Time: 06/21/23  7:33 AM  Result Value Ref Range   Sodium 138 135 - 145 mEq/L   Potassium 4.3 3.5 - 5.1 mEq/L   Chloride 105 96 - 112 mEq/L   CO2 26 19 - 32 mEq/L   Glucose, Bld 93 70 - 99 mg/dL   BUN 9 6 - 23 mg/dL   Creatinine, Ser 9.56 0.40 - 1.20 mg/dL   GFR 21.30 >86.57 mL/min     Comment: Calculated using the CKD-EPI Creatinine Equation (2021)   Calcium 9.0 8.4 - 10.5 mg/dL  Surgical pathology ([facility])     Status: None   Collection Time: 09/07/23 12:00 AM  Result Value Ref Range   SURGICAL PATHOLOGY      SURGICAL PATHOLOGY [facility] [address], [address] [place], [place] [address] Telephone [phone] or [phone] Fax [phone]  REPORT OF SURGICAL PATHOLOGY   Accession #: [id number] Patient Name: KEESA, RORICK Visit # : [id number]  MRN: [id number] Physician: Yancey Flemings DOB/Age [birth year] (Age: 71) Gender: F Collected Date: 09/07/2023 Received Date: 09/10/2023  FINAL DIAGNOSIS       1. Surgical [P], colon, rectum, polyp (1) :       - TUBULAR ADENOMA.      - NO HIGH GRADE DYSPLASIA OR MALIGNANCY.       DATE SIGNED OUT: 09/11/2023 ELECTRONIC SIGNATURE :  [place] Callas M.D., Nupur, Pathologist, Electronic Signature  MICROSCOPIC DESCRIPTION  CASE COMMENTS STAINS USED IN DIAGNOSIS: H&E H&E-2    CLINICAL HISTORY  SPECIMEN(S) OBTAINED 1. Surgical [P], Colon, Rectum, Polyp (1)  SPECIMEN COMMENTS: 1. Special screening for malignant neoplasms, colon; benign neoplasm of rectum SPECIMEN CLINICAL INFORMATION: 1. R/O adeno ma    Gross Description 1. Received in formalin are tan, soft tissue fragments that are submitted in toto. Number: 1 Size: 0.7 cm = 3 sec, (1B) ( TA )        Report signed out from the following location(s) [facility] [address] [place], [place] [address] CLIA #: [id number]  [facility] [address] [place], [place] [address] CLIA #: [id number]      Psychiatric Specialty Exam: Physical Exam  Review of Systems  Weight 170 lb (77.1 kg), last menstrual period 08/17/2023.There is no height or weight on file to calculate BMI.  General Appearance: Casual  Eye Contact:  Good  Speech:  Clear and Coherent and Normal Rate  Volume:   Normal  Mood:  Anxious  Affect:  Congruent  Thought Process:  Goal Directed  Orientation:  Full (Time, Place, and Person)  Thought Content:  WDL  Suicidal Thoughts:  No  Homicidal Thoughts:  No  Memory:  Immediate;   Good Recent;   Good Remote;   Good  Judgement:  Good  Insight:  Good  Psychomotor Activity:  Normal  Concentration:  Concentration: Good and Attention Span: Good  Recall:  Good  Fund of Knowledge:  Good  Language:  Good  Akathisia:  No  Handed:  Right  AIMS (if indicated):     Assets:  Communication Skills Desire for Improvement Housing Social Support Talents/Skills Transportation  ADL's:  Intact  Cognition:  WNL  Sleep:  ok but sometimes awake in night     Assessment/Plan: Major depressive disorder, recurrent episode, in partial remission (HCC) - Plan: escitalopram (LEXAPRO) 20 MG tablet, busPIRone (BUSPAR) 5 MG tablet  Attention deficit hyperactivity disorder (ADHD), combined type - Plan: atomoxetine (STRATTERA) 40 MG capsule, atomoxetine (STRATTERA) 60 MG capsule  Generalized anxiety disorder - Plan: escitalopram (LEXAPRO) 20 MG tablet, busPIRone (BUSPAR) 5 MG tablet  Discussed possibility of PMS versus premenopausal.  Encourage to see OB/GYN and get the hormone level checked.  Recommend to take the BuSpar 5 mg regularly 3 times a day as patient sometimes does not take 3 times a day.  She admitted when she takes it does help her anxiety.  Continue Strattera 60 mg every day and 40 mg when she needed sometimes.  Continue Lexapro 20 mg daily.  Patient so far stable on the medication and symptoms are manageable.  I encouraged to call us back if she has any question, concern or if she feels worsening of the symptoms.  Follow-up in 3 months   Follow Up Instructions:     I discussed the assessment and treatment plan with the patient. The patient was provided an opportunity to ask questions and all were answered. The patient agreed with the plan and demonstrated  an understanding of the instructions.   The patient was advised to call back or seek an in-person evaluation if the symptoms worsen or if the condition fails to improve as anticipated.    Collaboration of Care: Other provider involved in patient's care AEB notes are available in epic to review  Patient/Guardian was advised Release of Information must be obtained prior to any record release in  order to collaborate their care with an outside provider. Patient/Guardian was advised if they have not already done so to contact the registration department to sign all necessary forms in order for [place] to release information regarding their care.   Consent: Patient/Guardian gives verbal consent for treatment and assignment of benefits for services provided during this visit. Patient/Guardian expressed understanding and agreed to proceed.     I provided 22 minutes of non face to face time during this encounter.  Note: This document was prepared by [employer] voice dictation technology and any errors that results from this process are unintentional.    Cleotis Nipper, MD 09/12/2023

## 2023-10-27 ENCOUNTER — Encounter: Payer: Self-pay | Admitting: Family Medicine

## 2023-10-28 MED ORDER — HYOSCYAMINE SULFATE 0.125 MG SL SUBL: 0.1250 mg | SUBLINGUAL_TABLET | SUBLINGUAL | 5 refills | Status: AC | PRN

## 2023-11-04 ENCOUNTER — Other Ambulatory Visit (HOSPITAL_COMMUNITY): Payer: Self-pay | Admitting: Psychiatry

## 2023-11-04 DIAGNOSIS — F902 Attention-deficit hyperactivity disorder, combined type: Secondary | ICD-10-CM

## 2023-11-10 ENCOUNTER — Other Ambulatory Visit (HOSPITAL_COMMUNITY): Payer: Self-pay | Admitting: Psychiatry

## 2023-11-10 DIAGNOSIS — F411 Generalized anxiety disorder: Secondary | ICD-10-CM

## 2023-11-10 DIAGNOSIS — F3341 Major depressive disorder, recurrent, in partial remission: Secondary | ICD-10-CM

## 2023-11-18 ENCOUNTER — Other Ambulatory Visit: Payer: Self-pay | Admitting: Family Medicine

## 2023-11-18 ENCOUNTER — Other Ambulatory Visit (HOSPITAL_COMMUNITY): Payer: Self-pay | Admitting: Psychiatry

## 2023-11-18 DIAGNOSIS — F411 Generalized anxiety disorder: Secondary | ICD-10-CM

## 2023-11-18 DIAGNOSIS — F902 Attention-deficit hyperactivity disorder, combined type: Secondary | ICD-10-CM

## 2023-11-18 DIAGNOSIS — F3341 Major depressive disorder, recurrent, in partial remission: Secondary | ICD-10-CM

## 2023-11-19 NOTE — Telephone Encounter (Signed)
Last office visit 08/27/23 for Nicotine Dependence.  Last refilled 04/02/2023 for #30 with 5 refills.  Next Appt: No future appointments.

## 2023-11-25 ENCOUNTER — Other Ambulatory Visit: Payer: Self-pay | Admitting: Family Medicine

## 2023-11-26 MED ORDER — OZEMPIC (2 MG/DOSE) 8 MG/3ML ~~LOC~~ SOPN: 2.0000 mg | PEN_INJECTOR | SUBCUTANEOUS | 3 refills | Status: DC

## 2023-11-26 NOTE — Addendum Note (Signed)
Addended by: Damita Lack on: 11/26/2023 11:13 AM   Modules accepted: Orders

## 2023-11-26 NOTE — Telephone Encounter (Signed)
Last office visit 08/27/23 for smoking cessation counseling.  Betamethasone not on current medication list.   Refill note states:   The original prescription was discontinued on 06/24/2023 by Eustace Moore, MD. Renewing this prescription may not be appropriate.   Ozempic refill request is for 2 mg.  We have 1 mg dose on medication list.  Please advise.

## 2023-11-28 ENCOUNTER — Telehealth (HOSPITAL_COMMUNITY): Payer: BC Managed Care – PPO | Admitting: Psychiatry

## 2023-12-13 ENCOUNTER — Encounter (HOSPITAL_COMMUNITY): Payer: Self-pay | Admitting: Psychiatry

## 2023-12-13 ENCOUNTER — Telehealth (HOSPITAL_BASED_OUTPATIENT_CLINIC_OR_DEPARTMENT_OTHER): Payer: 59 | Admitting: Psychiatry

## 2023-12-13 VITALS — Wt 170.0 lb

## 2023-12-13 DIAGNOSIS — F902 Attention-deficit hyperactivity disorder, combined type: Secondary | ICD-10-CM | POA: Diagnosis not present

## 2023-12-13 DIAGNOSIS — F411 Generalized anxiety disorder: Secondary | ICD-10-CM

## 2023-12-13 DIAGNOSIS — F3341 Major depressive disorder, recurrent, in partial remission: Secondary | ICD-10-CM

## 2023-12-13 MED ORDER — ESCITALOPRAM OXALATE 20 MG PO TABS: 20.0000 mg | ORAL_TABLET | Freq: Every day | ORAL | 0 refills | Status: DC

## 2023-12-13 MED ORDER — BUSPIRONE HCL 5 MG PO TABS: 5.0000 mg | ORAL_TABLET | Freq: Three times a day (TID) | ORAL | 2 refills | Status: DC | PRN

## 2023-12-13 MED ORDER — ATOMOXETINE HCL 60 MG PO CAPS: 60.0000 mg | ORAL_CAPSULE | Freq: Every day | ORAL | 0 refills | Status: DC

## 2023-12-13 NOTE — Progress Notes (Signed)
 [facility] MD/PA/NP OP Progress Note  Virtual Visit via Video Note  I connected with Candice Mcgrath on 12/13/23 at  8:40 AM EST by a video enabled telemedicine application and verified that I am speaking with the correct person using two identifiers.  Location: Patient: Home Provider: Home Office   I discussed the limitations of evaluation and management by telemedicine and the availability of in person appointments. The patient expressed understanding and agreed to proceed.   12/13/2023 8:22 AM Candice Mcgrath  MRN:  [id number]  Chief Complaint:  Chief Complaint  Patient presents with   Follow-up   Medication Refill   HPI: Patient is evaluated by video session.  She is at work.  She is not sure if she has to go to work late because of the better.  Patient is a Engineer, site.  She is very happy as she recently passed her boards and got pay raise.  She feels the current medicine is working and her anxiety depression is under control.  Most of the days she takes 60 mg Strattera but there are days when she take extra 40 when there is too much work.  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  She reported family life is going well.  She reports there are days when she feels down when she has monthly cycles but has not seen OB/GYN to get labs.  She has no tremor or shakes or any EPS.  She denies any crying spells or any feeling of hopelessness or worthlessness.  She is still struggled to take the BuSpar 3 times a day but most of the time she takes twice a day.  She continues to attend AA meetings and remains sober for a while.  Patient lives with her husband.  She had a good support from her brother and sister-in-law.  She admitted some time get frustrated with political news but tried to stay away from social media to get more aggravated.  Her appetite is okay.  Her energy level is good.  Her weight is a stable.  Visit Diagnosis:     ICD-10-CM   1. Major depressive disorder, recurrent episode, in partial remission (HCC)  F33.41 escitalopram (LEXAPRO) 20 MG tablet    busPIRone (BUSPAR) 5 MG tablet    2. Generalized anxiety disorder  F41.1 escitalopram (LEXAPRO) 20 MG tablet    busPIRone (BUSPAR) 5 MG tablet    3. Attention deficit hyperactivity disorder (ADHD), combined type  F90.2 atomoxetine (STRATTERA) 60 MG capsule      Past Psychiatric History: Reviewed History of ADHD, anxiety, depression, drug and ETOH use. 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  H/O rehab in 2007.  As per chart tried Prozac and Wellbutrin but no details. No history of suicidal attempt, psychosis, mania, hallucination, PTSD, panic attacks.  For past 14 years medicines were managed by her primary care physician.    Past Medical History:  Past Medical History:  Diagnosis Date   Allergic rhinitis    Anxiety    Depression    Herpes    History of alcohol abuse    [facility]   History of drug abuse in remission (HCC)    adderrall, Xanax, MJ, others, no IV   Hyperlipidemia    IBS (irritable bowel syndrome)    Migraine headache 12/24/2012   Nephrolithiasis    Substance abuse (HCC)    ETOH, Aderall pot  Past Surgical History:  Procedure Laterality Date   Basket Surgery  2005, 06   kidney stones   TYMPANOSTOMY TUBE PLACEMENT Bilateral    as a child    Family History:  Family History  Problem Relation Age of Onset   Colon polyps Mother    Stroke Maternal Grandmother    Cancer Maternal Grandfather    Heart disease Paternal Grandfather    Breast cancer Cousin    Alcohol abuse Other        GP   Stroke Other        GGM   Colon cancer Neg Hx    Esophageal cancer Neg Hx    Rectal cancer Neg Hx    Stomach cancer Neg Hx     Social History:  Social History   Socioeconomic History   Marital status: Married    Spouse name: Not on file   Number of children: Not on file   Years of  education: Not on file   Highest education level: Master's degree (e.g., MA, MS, MEng, MEd, MSW, MBA)  Occupational History   Occupation: spanish    Employer: Advice worker [employer]    Comment: @ [employer]  Tobacco Use   Smoking status: Every Day    Current packs/day: 0.00    Types: Cigarettes    Last attempt to quit: 06/24/2016    Years since quitting: 7.4   Smokeless tobacco: Former    Quit date: 12/04/2008  Vaping Use   Vaping status: Every Day  Substance and Sexual Activity   Alcohol use: Not Currently    Alcohol/week: 2.0 - 4.0 standard drinks of alcohol    Types: 2 - 4 Cans of beer per week    Comment: recovering AA   Drug use: No    Comment: Recovering NA   Sexual activity: Yes  Other Topics Concern   Not on file  Social History Narrative   Not on file   Social Drivers of Health   Financial Resource Strain: Low Risk  (03/29/2023)   Overall Financial Resource Strain (CARDIA)    Difficulty of Paying Living Expenses: Not hard at all  Food Insecurity: No Food Insecurity (03/29/2023)   Hunger Vital Sign    Worried About Running Out of Food in the Last Year: Never true    Ran Out of Food in the Last Year: Never true  Transportation Needs: No Transportation Needs (03/29/2023)   PRAPARE - Administrator, Civil Service (Medical): No    Lack of Transportation (Non-Medical): No  Physical Activity: Insufficiently Active (03/29/2023)   Exercise Vital Sign    Days of Exercise per Week: 1 day    Minutes of Exercise per Session: 20 min  Stress: No Stress Concern Present (03/29/2023)   [employer] of Occupational Health - Occupational Stress Questionnaire    Feeling of Stress : Not at all  Social Connections: Moderately Integrated (03/29/2023)   Social Connection and Isolation Panel [NHANES]    Frequency of Communication with Friends and Family: Once a week    Frequency of Social Gatherings with Friends and Family: Once a week    Attends Religious Services: 1  to 4 times per year    Active Member of [employer] or Organizations: Yes    Attends Banker Meetings: 1 to 4 times per year    Marital Status: Married    Allergies:  Allergies  Allergen Reactions   Sulfamethoxazole-Trimethoprim     REACTION: itching  Sulfa Antibiotics Rash    Metabolic Disorder Labs: Lab Results  Component Value Date   HGBA1C 5.7 06/21/2023   Lab Results  Component Value Date   PROLACTIN 7.3 12/26/2021   PROLACTIN 10.0 06/23/2010   Lab Results  Component Value Date   CHOL 137 06/21/2023   TRIG 71.0 06/21/2023   HDL 43.30 06/21/2023   CHOLHDL 3 06/21/2023   VLDL 14.2 06/21/2023   LDLCALC 79 06/21/2023   LDLCALC 86 12/26/2021   Lab Results  Component Value Date   TSH 2.26 12/26/2021   TSH 2.08 11/29/2020    Therapeutic Level Labs: No results found for: "LITHIUM" No results found for: "VALPROATE" No results found for: "CBMZ"  Current Medications: Current Outpatient Medications  Medication Sig Dispense Refill   albuterol (VENTOLIN HFA) 108 (90 Base) MCG/ACT inhaler Inhale 1-2 puffs into the lungs every 6 (six) hours as needed for wheezing or shortness of breath. 18 g 0   Ascorbic Acid (VITAMIN C) 100 MG tablet Take 100 mg by mouth daily.     atomoxetine (STRATTERA) 40 MG capsule Take 1 capsule (40 mg total) by mouth daily. 30 capsule 0   atomoxetine (STRATTERA) 60 MG capsule Take 1 capsule (60 mg total) by mouth daily. 90 capsule 0   Azelastine HCl 137 MCG/SPRAY SOLN Place into both nostrils.     betamethasone valerate ointment (VALISONE) 0.1 % APPLY TO AFFECTED AREA TWICE A DAY 45 g 1   busPIRone (BUSPAR) 5 MG tablet Take 1 tablet (5 mg total) by mouth 3 (three) times daily as needed. 90 tablet 2   chlorproMAZINE (THORAZINE) 25 MG tablet Take 25 mg by mouth daily as needed.     EPINEPHrine 0.3 mg/0.3 mL IJ SOAJ injection USE UTD PRF SYSTEMATIC REACTION.  1   escitalopram (LEXAPRO) 20 MG tablet Take 1 tablet (20 mg total) by mouth daily.  90 tablet 0   fluticasone (FLONASE) 50 MCG/ACT nasal spray Place into both nostrils.     hydrOXYzine (ATARAX) 25 MG tablet Take 25 mg by mouth daily as needed for itching.     hyoscyamine (LEVSIN SL) 0.125 MG SL tablet Place 1 tablet (0.125 mg total) under the tongue every 4 (four) hours as needed. 30 tablet 5   ketorolac (TORADOL) 10 MG tablet Take 1 tablet (10 mg total) by mouth every 6 (six) hours as needed for moderate pain or severe pain. 40 tablet 0   levocetirizine (XYZAL) 5 MG tablet Take 1 tablet (5 mg total) by mouth every evening. 90 tablet 3   lidocaine-prilocaine (EMLA) cream APPLY TOPICALLY 4 (FOUR) TIMES DAILY AS NEEDED. FOUR TIMES A DAY TO AFFECTED AREA, 1 MONTH SUPPLY 30 g 2   Multiple Vitamins-Minerals (MULTI-VITAMIN GUMMIES PO) Take by mouth.     PAZEO 0.7 % SOLN   3   Semaglutide, 2 MG/DOSE, (OZEMPIC, 2 MG/DOSE,) 8 MG/3ML SOPN Inject 2 mg into the skin once a week. 9 mL 3   SUMAtriptan (IMITREX) 100 MG tablet Take by mouth.     tiZANidine (ZANAFLEX) 4 MG tablet TAKE 1 TABLET BY MOUTH EVERYDAY AT BEDTIME 90 tablet 1   triamcinolone cream (KENALOG) 0.1 % Apply 1 application topically 2 (two) times daily. 30 g 0   valACYclovir (VALTREX) 500 MG tablet TAKE 1 TABLET (500 MG TOTAL) BY MOUTH 2 (TWO) TIMES DAILY. FOR 3 DAYS DURING A FLARE 180 tablet 3   No current facility-administered medications for this visit.     Musculoskeletal: Strength & Muscle Tone: within  normal limits Gait & Station: normal Patient leans: N/A  Psychiatric Specialty Exam: Review of Systems  Weight 170 lb (77.1 kg).There is no height or weight on file to calculate BMI.  General Appearance: Casual  Eye Contact:  Good  Speech:  Clear and Coherent and Normal Rate  Volume:  Normal  Mood:  Euthymic  Affect:  Congruent  Thought Process:  Goal Directed  Orientation:  Full (Time, Place, and Person)  Thought Content: Logical   Suicidal Thoughts:  No  Homicidal Thoughts:  No  Memory:  Immediate;    Good Recent;   Good Remote;   Good  Judgement:  Good  Insight:  Good  Psychomotor Activity:  Normal  Concentration:  Concentration: Good and Attention Span: Good  Recall:  Good  Fund of Knowledge: Good  Language: Good  Akathisia:  No  Handed:  Right  AIMS (if indicated): not done  Assets:  Communication Skills Desire for Improvement Housing Social Support Talents/Skills Transportation  ADL's:  Intact  Cognition: WNL  Sleep:  Good   Screenings: GAD-7    Flowsheet Row Office Visit from 07/12/2023 in [facility] [place] Office Visit from 04/02/2023 in [facility] [place] Office Visit from 12/06/2022 in [facility] [place] [place] Office Visit from 12/26/2021 in [facility] [place]  Total GAD-7 Score 4 6 6 7       PHQ2-9    Flowsheet Row Office Visit from 07/12/2023 in [facility] [place] [facility] Office Visit from 04/02/2023 in [facility] [place] [facility] Office Visit from 12/06/2022 in [facility] Office Visit from 07/27/2022 in [facility] [place] Office Visit from [facility]/05/2022 [facility]  PHQ-2 Total Score 1 2 2  0 0  PHQ-9 Total Score 2 6 7 3  --      Flowsheet Row ED from 07/15/2023 in [facility] Visit from 05/05/2022 in [facility] ED from 11/13/2021 in [facility] RISK CATEGORY No Risk No Risk No Risk        Assessment and Plan: Patient is taking her medication and reported no major concern.  Her mood anxiety and ADHD symptoms are stable.  She remained sober from illegal substance and drinking.  She continues to attend AA meeting.  Discussed low moods around her monthly cycles and recommend to have her visit with OB/GYN to get a hormone  level drawn.  Patient agreed to make an appointment.  He does not want to change the medication.  She like to keep the Strattera 60 mg daily as she has plenty 40 mg and does not need at this time.  She is taking 40 mg Strattera only as needed.  Continue Lexapro 20 mg daily and BuSpar 5 mg twice a day most of the days however when she remembers to take the thyroid pill.  Discussed medication side effects and benefits.  Combination given on passing her boards.  Recommended to call us back if she has any question or any concern.  Follow-up in 3 months  Collaboration of Care: Collaboration of Care: Other provider involved in patient's care AEB notes are available in epic to review  Patient/Guardian was advised Release of Information must be obtained prior to any record release in order to collaborate their care with an outside provider.  Patient/Guardian was advised if they have not already done so to contact the registration department to sign all necessary forms in order for [place] to release information regarding their care.   Consent: Patient/Guardian gives verbal consent for treatment and assignment of benefits for services provided during this visit. Patient/Guardian expressed understanding and agreed to proceed.     Follow Up Instructions:    I discussed the assessment and treatment plan with the patient. The patient was provided an opportunity to ask questions and all were answered. The patient agreed with the plan and demonstrated an understanding of the instructions.   The patient was advised to call back or seek an in-person evaluation if the symptoms worsen or if the condition fails to improve as anticipated.  I provided 20 minutes of non-face-to-face time during this encounter.   Cleotis Nipper, MD    Cleotis Nipper, MD 12/13/2023, 8:22 AM

## 2023-12-18 ENCOUNTER — Other Ambulatory Visit: Payer: Self-pay | Admitting: Family Medicine

## 2023-12-18 NOTE — Telephone Encounter (Signed)
 Last office visit 08/27/23 for nicotine dependence.  Last refilled 06/11/23 for #90 with 1 refill.  Next Appt: No future appointments.

## 2024-01-28 ENCOUNTER — Ambulatory Visit: Admitting: Family Medicine

## 2024-01-28 VITALS — BP 100/70 | HR 89 | Temp 99.5°F | Ht 60.75 in | Wt 167.5 lb

## 2024-01-28 DIAGNOSIS — M25572 Pain in left ankle and joints of left foot: Secondary | ICD-10-CM

## 2024-01-28 DIAGNOSIS — M25511 Pain in right shoulder: Secondary | ICD-10-CM | POA: Diagnosis not present

## 2024-01-28 DIAGNOSIS — S0990XA Unspecified injury of head, initial encounter: Secondary | ICD-10-CM

## 2024-01-28 DIAGNOSIS — S060X0A Concussion without loss of consciousness, initial encounter: Secondary | ICD-10-CM

## 2024-01-28 DIAGNOSIS — M25561 Pain in right knee: Secondary | ICD-10-CM | POA: Diagnosis not present

## 2024-01-28 NOTE — Progress Notes (Unsigned)
 Candice Mcgrath T. Kosha Jaquith, MD, CAQ Sports Medicine [facility] at [facility] [address] [place], [address]  Phone: [phone]  FAX: [phone]  Candice Mcgrath - 48 y.o. female  MRN [id number]  Date of Birth: January 15, 1976  Date: 01/28/2024  PCP: [name], MD  Referral: [name], MD  Chief Complaint  Patient presents with   Fall    Saturday Morning    Knee Pain    Right-Nothing is bad just wanted to be checked out   Shoulder Pain    Right   Ankle Pain    Left   Headache   Subjective:   Candice Mcgrath is a 48 y.o. very pleasant female patient with Body mass index is 31.91 kg/m. who presents with the following:  Stepped outside and maybe missed a step.  [place] Seat and hit the concrete, not sure if she hit her head.   Had a migraine for about 36 hours, after she fell.   She is not entirely sure how she fell and she is not entirely sure what she struck on the concrete.  Since then she has had some knee pain, right shoulder pain, left ankle pain.  She does have a history of multiple left-sided ankle sprains in the past. She does not have any focal swelling, bruising or pain with direct pressure. She is not having any kind of limping or difficulty moving or using her joints.  Prior to her fall she had no headache or any other symptoms.  Subsequent to fall she has had a headache, photophobia, dizziness.  She denies nausea. Denies visual changes. She does not think she has more anxiety or depression.  On scat 5 testing: Total number of symptoms: 20/22 Symptom severity score 37/132  She endorses moderate headache, neck pain. Mild pressure in head, dizziness, balance problems, photophobia, phonophobia, feeling slowed down, foggy, does not feel right, difficulty concentrating, remembering, fatigue, confusion, drowsiness, increased emotional state, irritability, sadness, anxiety, as well as difficulty falling  asleep  Orientation: 5/5 Immediate memory 15/15 Digits backwards 2/4 Months in reverse order: 1/1  Reads well, mild restriction in range of motion at the neck, VOMS is normal, finger-nose normal. Unable to do tandem gait  Cranial nerves II through XII are intact  Grossly unsteady on balance exam Worse on single-leg stance and tandem stance  Review of Systems is noted in the HPI, as appropriate  Objective:   BP 100/70 (BP Location: Left Arm, Patient Position: Sitting, Cuff Size: Normal)   Pulse 89   Temp 99.5 F (37.5 C) (Temporal)   Ht 5' 0.75" (1.543 m)   Wt 167 lb 8 oz (76 kg)   LMP 01/24/2024   SpO2 99%   BMI 31.91 kg/m   GEN: No acute distress; alert,appropriate. PULM: Breathing comfortably in no respiratory distress PSYCH: Normally interactive.   Specified neurological exam as above  Shoulder: R Inspection: No muscle wasting or winging Ecchymosis/edema: neg  AC joint, scapula, clavicle: NT Cervical spine: She does have a modest restriction of motion, and she does have pain with terminal motion pain in the posterior paracervical musculature as well as the right sided trapezius. Spurling's: neg Abduction: full, 5/5 Flexion: full, 5/5 IR, full, lift-off: 5/5 ER at neutral: full, 5/5 AC crossover and compression: neg Neer: neg Hawkins: neg Drop Test: neg Jobe: neg Supraspinatus insertion: NT Bicipital groove: NT Speed's: neg Yergason's: neg Sulcus sign: neg Scapular dyskinesis: none C5-T1 intact Sensation intact Grip 5/5  Knee: Right Gait: Normal heel toe pattern ROM: 0-1 35 Effusion: neg Echymosis or edema: none Patellar tendon NT Painful PLICA: neg Patellar grind: negative Medial and lateral patellar facet loading: negative medial and lateral joint lines:NT Mcmurray's neg Flexion-pinch neg Varus and valgus stress: stable Lachman: neg Ant and Post drawer: neg Hip abduction, IR, ER: WNL   ANKLE: L Echymosis: no Edema: no ROM: Full  dorsi and plantar flexion, inversion, eversion Gait: heel toe, non-antalgic Lateral Mall: NT Medial Mall: NT Talus: NT Navicular: NT Cuboid: NT Calcaneous: NT Metatarsals: NT 5th MT: NT Phalanges: NT Achilles: NT Plantar Fascia: NT Fat Pad: NT Peroneals: NT Post Tib: NT Great Toe: Nml motion Ant Drawer: She has significant give at the anterior drawer suggesting major prior ankle sprain and instability Talar Tilt: neg ATFL: NT CFL: NT Deltoid: NT Str: 5/5 Other Special tests: none Sensation: intact   Laboratory and Imaging Data:  Assessment and Plan:     ICD-10-CM   1. Concussion without loss of consciousness, initial encounter  S06.0X0A     2. Closed head injury, initial encounter  S09.90XA     3. Acute pain of right shoulder  M25.511     4. Acute pain of right knee  M25.561     5. Acute left ankle pain  M25.572      Total encounter time: 40 minutes. This includes total time spent on the day of encounter.  Prolonged concussion evaluation.  Patient has an obvious concussion secondary to fall.  She endorses a very high number of symptoms with an additional symptom severity score of 37/132.  Confounding factors include migraine headache, anxiety, depression, and ADD.  I am going to pull her out of work for this week, and she has spring break next week, as well.  We will recheck in the office in 10 to 14 days.  I have no concerns regarding any of her musculoskeletal pains.  No concern for fracture or other major acute injury.  She does have a neck strain and trapezius strain on the right.  Patient Instructions  Mild Traumatic Brain Injury (CONCUSSSION):  [name], MD,  Sports Medicine Adapted from [facility], [employer], and [employer]  Think of the human brain almost as an egg yolk and your skull as an egg shell. When your head or body takes a hit, it can cause your brain to shake around inside your skull, injuring the  brain. A concussion is not only caused by a hit to the head, but can also be caused by an impact to the body that ends up shaking your brain in your skull, such as whiplash.  A common misconception about concussion is that one loses consciousness. However, loss of consciousness occurs in only less than 10% of concussion cases.  Concussive symptoms typically resolve in 7 to 10 days (sports-related concussions) or within 3 months (non-athletes).  Approximately 20% of people have symptoms after 6 weeks.  With concussions, we have learned that it generally takes youth longer to recover from concussions. Another thing that we now know about concussions is that it usually takes female athletes longer to recover than female athletes.  No two concussions are identical. In fact, there are at least six different clinical trajectories that concussions may take.  Signs and Symptoms of Concussion:  The signs and symptoms of a concussion are incredibly important because a concussion doesn't show up on imaging like an x-ray, CT, or MRI scan and there is no objective  test, like a blood or saliva test, that can determine if a patient has a concussion. A doctor makes a concussion diagnosis based on the results of a comprehensive examination, which includes observing signs of concussion and patients reporting symptoms of concussion appearing after an impact to the head or body. Concussion signs and symptoms are the brain's way of showing it is injured and not functioning normally.  CONCUSSION SIGNS  Concussion signs are what someone could observe about you to determine if you have a concussion.   Common concussion signs include:  Loss of consciousness Problems with balance Glazed look in the eyes Amnesia Delayed response to questions Forgetting an instruction, confusion about an assignment or position, or  confusion of the game, score, or opponent Inappropriate crying Inappropriate  laughter Vomiting  CONCUSSION SYMPTOMS  Concussion symptoms are what someone who is concussed will tell you that they are experiencing. Concussion symptoms typically fall into six major categories:  1- Somatic (Physical) Symptoms  Headache Light-headedness Dizziness Nausea Sensitivity to light Sensitivity to noise  2- Cognitive Symptoms  Difficulties with attention Memory problems Loss of focus Difficulty multitasking Difficulty completing mental tasks  3- Sleep Symptoms  Sleeping more than usual Sleeping less than usual Having trouble falling asleep  4- Emotional Symptoms  Anxiety Depression Panic attacks  5 - Vestibular Symptoms  The vestibular system is affected in nearly 60% of youth and adolescent athletes following a concussion.  But what is the vestibular system?  It's the sensory system that helps with your sense of balance and spatial orientation. Think of a gyroscope! With help from your inner ears, your vestibular system detects the motion or position of your head in space. It sends information to your brain that's needed for balance and stable vision.  Need an example? If you're moving and looking at moving objects at the same time (think riding in a car), you're able to stay focused and not lose visual clarity. During a vestibular concussion, your gyroscope isn't working at full potential.  Difficulty with balance Dizziness. It may feel like the room is spinning or a slow, wavy sensation. (Like you're on a boat!) Trouble stabilizing vision when moving your head. (We call this Vestibular-Ocular Reflux, or VOR.) Think back to riding in a car. With a vestibular concussion, you can't stay focused. (The technical name for this is Visual Motion Sensitivity, or VMS.) Triggers  These 3 things could bring on vestibular symptoms:  Dynamic movements Busy environments, like the grocery store Crowds  6 - Oculo-motor  A concussion that affects the  ocular, or visual, system of the brain. Typically, patients with ocular-motor concussions report pressure headaches in the front of their head, feeling more tired than normal, and becoming more symptomatic doing math or science exercises at school.  Patients also experience difficulties with their eyes working together. Follow along to explore some of the most common symptoms.  Convergence  The eyes converge when viewing objects up close, such as with reading. With convergence problems, patients may see a double image as a target moves closer to them. Typically, without a concussion, objects can be brought very close without doubling.  Accommodation  Accommodation problems cause an object to become blurry as it is viewed up close. Accommodative and convergence problems are often experienced together and can impact reading and other near-vision activities.  Pursuits and Saccades  The eyes use pursuit eye movements to follow objects; while saccade eyes movements allow the eyes to shift rapidly from one object to  another. Tracking objects, reading a book, scrolling on a computer or even watching for a moving car while crossing the street can be difficult when patients have problems with pursuit and saccade eye movements.  Misalignment  When people with eye misalignment (one eye drifts, eyes aren't perfectly aligned) sustain a concussion, the brain may have difficulty compensating for the misalignment like it once did. This can result in blurry vision, difficulty taking notes in class and focusing on the chalkboard.  Note: This is not an exhaustive list of concussion signs and symptoms, and it may take a few days for concussion symptoms to appear after the initial injury.  TREATMENT:  THE MOST IMPORTANT THING IS REST AS SOON AS POSSIBLE AFTER INJURY SO THAT THE BRAIN CAN RECOVER. COMPLETE PHYSICAL AND MENTAL REST ARE NEEDED INITIALLY.   THAT MEANS: NO SCHOOL OR WORK FOR AT LEAST 3 DAYS AND  CLEARED BY YOUR DOCTOR NO MENTAL EXERTION, MEANING NO WORK, NO HOMEWORK, NO TEST TAKING.  Avoid situations with loud noise, bright lights, or crowds. However, this doesn't mean isolate yourself in a dark room for a week. Too much isolation and boredom can be harmful, contributing to feelings of anxiety, depression, and resulting in increased recovery time. Spend time with friends and family, but monitor your symptoms and avoid situations that make you feel worse.   NO VIDEO GAMES, NO USING THE COMPUTER, NO TEXTING, NO USING SMARTPHONES, NO USE OF AN IPAD OR TABLET. DO NOT GO TO A MOVIE THEATRE OR WATCH SPORTS ON TV. HDTV TENDS TO MAKE PEOPLE FEEL WORSE.   ON APPROXIMATELY DAY 4, BEGIN VERY LIGHT EXERCISE SUCH AS WALKING. DO NOT START ANY STRENUOUS EXERCISE.   You will be given return to school or return to work recommendations.    Disposition: Return in about 2 weeks (around 02/11/2024) for concussion.  [employer] speech-to-text software was used for transcription in this dictation.  Possible transcriptional errors can occur using Animal nutritionist.   Signed,  Elpidio Galea. Kimo Bancroft, MD   Outpatient Encounter Medications as of 01/28/2024  Medication Sig   albuterol (VENTOLIN HFA) 108 (90 Base) MCG/ACT inhaler Inhale 1-2 puffs into the lungs every 6 (six) hours as needed for wheezing or shortness of breath.   Ascorbic Acid (VITAMIN C) 100 MG tablet Take 100 mg by mouth daily.   atomoxetine (STRATTERA) 40 MG capsule Take 1 capsule (40 mg total) by mouth daily.   atomoxetine (STRATTERA) 60 MG capsule Take 1 capsule (60 mg total) by mouth daily.   Azelastine HCl 137 MCG/SPRAY SOLN Place into both nostrils.   betamethasone valerate ointment (VALISONE) 0.1 % APPLY TO AFFECTED AREA TWICE A DAY   busPIRone (BUSPAR) 5 MG tablet Take 1 tablet (5 mg total) by mouth 3 (three) times daily as needed.   chlorproMAZINE (THORAZINE) 25 MG tablet Take 25 mg by mouth daily as needed.   EPINEPHrine 0.3  mg/0.3 mL IJ SOAJ injection USE UTD PRF SYSTEMATIC REACTION.   escitalopram (LEXAPRO) 20 MG tablet Take 1 tablet (20 mg total) by mouth daily.   fluticasone (FLONASE) 50 MCG/ACT nasal spray Place into both nostrils.   hydrOXYzine (ATARAX) 25 MG tablet Take 25 mg by mouth daily as needed for itching.   hyoscyamine (LEVSIN SL) 0.125 MG SL tablet Place 1 tablet (0.125 mg total) under the tongue every 4 (four) hours as needed.   ketorolac (TORADOL) 10 MG tablet Take 1 tablet (10 mg total) by mouth every 6 (six) hours as needed for  moderate pain or severe pain.   levocetirizine (XYZAL) 5 MG tablet Take 1 tablet (5 mg total) by mouth every evening.   lidocaine-prilocaine (EMLA) cream APPLY TOPICALLY 4 (FOUR) TIMES DAILY AS NEEDED. FOUR TIMES A DAY TO AFFECTED AREA, 1 MONTH SUPPLY   Multiple Vitamins-Minerals (MULTI-VITAMIN GUMMIES PO) Take by mouth.   PAZEO 0.7 % SOLN    Semaglutide, 2 MG/DOSE, (OZEMPIC, 2 MG/DOSE,) 8 MG/3ML SOPN Inject 2 mg into the skin once a week.   SUMAtriptan (IMITREX) 100 MG tablet Take by mouth.   tiZANidine (ZANAFLEX) 4 MG tablet TAKE 1 TABLET BY MOUTH EVERYDAY AT BEDTIME   triamcinolone cream (KENALOG) 0.1 % Apply 1 application topically 2 (two) times daily.   valACYclovir (VALTREX) 500 MG tablet TAKE 1 TABLET (500 MG TOTAL) BY MOUTH 2 (TWO) TIMES DAILY. FOR 3 DAYS DURING A FLARE   No facility-administered encounter medications on file as of 01/28/2024.

## 2024-01-28 NOTE — Patient Instructions (Signed)
Mild Traumatic Brain Injury (CONCUSSSION):  Tiersa Dayley, MD, [facility] Adapted from [facility], [facility], and [employer]  Think of the human brain almost as an egg yolk and your skull as an egg shell. When your head or body takes a hit, it can cause your brain to shake around inside your skull, injuring the brain. A concussion is not only caused by a hit to the head, but can also be caused by an impact to the body that ends up shaking your brain in your skull, such as whiplash.  A common misconception about concussion is that one loses consciousness. However, loss of consciousness occurs in only less than 10% of concussion cases.  Concussive symptoms typically resolve in 7 to 10 days (sports-related concussions) or within 3 months (non-athletes).  Approximately 20% of people have symptoms after 6 weeks.  With concussions, we have learned that it generally takes youth longer to recover from concussions. Another thing that we now know about concussions is that it usually takes female athletes longer to recover than female athletes.  No two concussions are identical. In fact, there are at least six different clinical trajectories that concussions may take.  Signs and Symptoms of Concussion:  The signs and symptoms of a concussion are incredibly important because a concussion doesn't show up on imaging like an x-ray, CT, or MRI scan and there is no objective test, like a blood or saliva test, that can determine if a patient has a concussion. A doctor makes a concussion diagnosis based on the results of a comprehensive examination, which includes observing signs of concussion and patients reporting symptoms of concussion appearing after an impact to the head or body. Concussion signs and symptoms are the brain's way of showing it is injured and not functioning normally.  CONCUSSION SIGNS  Concussion signs are what someone could observe about you to  determine if you have a concussion.   Common concussion signs include:  Loss of consciousness Problems with balance Glazed look in the eyes Amnesia Delayed response to questions Forgetting an instruction, confusion about an assignment or position, or  confusion of the game, score, or opponent Inappropriate crying Inappropriate laughter Vomiting  CONCUSSION SYMPTOMS  Concussion symptoms are what someone who is concussed will tell you that they are experiencing. Concussion symptoms typically fall into six major categories:  1- Somatic (Physical) Symptoms  Headache Light-headedness Dizziness Nausea Sensitivity to light Sensitivity to noise  2- Cognitive Symptoms  Difficulties with attention Memory problems Loss of focus Difficulty multitasking Difficulty completing mental tasks  3- Sleep Symptoms  Sleeping more than usual Sleeping less than usual Having trouble falling asleep  4- Emotional Symptoms  Anxiety Depression Panic attacks  5 - Vestibular Symptoms  The vestibular system is affected in nearly 60% of youth and adolescent athletes following a concussion.  But what is the vestibular system?  It's the sensory system that helps with your sense of balance and spatial orientation. Think of a gyroscope! With help from your inner ears, your vestibular system detects the motion or position of your head in space. It sends information to your brain that's needed for balance and stable vision.  Need an example? If you're moving and looking at moving objects at the same time (think riding in a car), you're able to stay focused and not lose visual clarity. During a vestibular concussion, your gyroscope isn't working at full potential.  Difficulty with balance Dizziness. It may feel like the room is spinning   or a slow, wavy sensation. (Like you're on a boat!) Trouble stabilizing vision when moving your head. (We call this Vestibular-Ocular Reflux, or VOR.) Think  back to riding in a car. With a vestibular concussion, you can't stay focused. (The technical name for this is Visual Motion Sensitivity, or VMS.) Triggers  These 3 things could bring on vestibular symptoms:  Dynamic movements Busy environments, like the grocery store Crowds  6 - Oculo-motor  A concussion that affects the ocular, or visual, system of the brain. Typically, patients with ocular-motor concussions report pressure headaches in the front of their head, feeling more tired than normal, and becoming more symptomatic doing math or science exercises at school.  Patients also experience difficulties with their eyes working together. Follow along to explore some of the most common symptoms.  Convergence  The eyes converge when viewing objects up close, such as with reading. With convergence problems, patients may see a double image as a target moves closer to them. Typically, without a concussion, objects can be brought very close without doubling.  Accommodation  Accommodation problems cause an object to become blurry as it is viewed up close. Accommodative and convergence problems are often experienced together and can impact reading and other near-vision activities.  Pursuits and Saccades  The eyes use pursuit eye movements to follow objects; while saccade eyes movements allow the eyes to shift rapidly from one object to another. Tracking objects, reading a book, scrolling on a computer or even watching for a moving car while crossing the street can be difficult when patients have problems with pursuit and saccade eye movements.  Misalignment  When people with eye misalignment (one eye drifts, eyes aren't perfectly aligned) sustain a concussion, the brain may have difficulty compensating for the misalignment like it once did. This can result in blurry vision, difficulty taking notes in class and focusing on the chalkboard.  Note: This is not an exhaustive list of concussion  signs and symptoms, and it may take a few days for concussion symptoms to appear after the initial injury.  TREATMENT:  THE MOST IMPORTANT THING IS REST AS SOON AS POSSIBLE AFTER INJURY SO THAT THE BRAIN CAN RECOVER. COMPLETE PHYSICAL AND MENTAL REST ARE NEEDED INITIALLY.   THAT MEANS: NO SCHOOL OR WORK FOR AT LEAST 3 DAYS AND CLEARED BY YOUR DOCTOR NO MENTAL EXERTION, MEANING NO WORK, NO HOMEWORK, NO TEST TAKING.  Avoid situations with loud noise, bright lights, or crowds. However, this doesn't mean isolate yourself in a dark room for a week. Too much isolation and boredom can be harmful, contributing to feelings of anxiety, depression, and resulting in increased recovery time. Spend time with friends and family, but monitor your symptoms and avoid situations that make you feel worse.   NO VIDEO GAMES, NO USING THE COMPUTER, NO TEXTING, NO USING SMARTPHONES, NO USE OF AN IPAD OR TABLET. DO NOT GO TO A MOVIE THEATRE OR WATCH SPORTS ON TV. HDTV TENDS TO MAKE PEOPLE FEEL WORSE.   ON APPROXIMATELY DAY 4, BEGIN VERY LIGHT EXERCISE SUCH AS WALKING. DO NOT START ANY STRENUOUS EXERCISE.   You will be given return to school or return to work recommendations.  

## 2024-01-29 ENCOUNTER — Encounter: Payer: Self-pay | Admitting: Family Medicine

## 2024-02-07 ENCOUNTER — Ambulatory Visit: Admitting: Family Medicine

## 2024-02-07 VITALS — BP 106/70 | HR 92 | Temp 98.4°F | Ht 60.75 in | Wt 166.0 lb

## 2024-02-07 DIAGNOSIS — S060X0D Concussion without loss of consciousness, subsequent encounter: Secondary | ICD-10-CM

## 2024-02-07 NOTE — Progress Notes (Signed)
 Candice Musleh T. Allona Gondek, MD, CAQ Sports Medicine [facility] at [facility] [address] [place], [address]  Phone: [phone]  FAX: [phone]  Candice Mcgrath - 48 y.o. female  MRN [id number]  Date of Birth: [birth year]  Date: 02/07/2024  PCP: Scherrie Curt, MD  Referral: Scherrie Curt, MD  Chief Complaint  Patient presents with   Concussion Follow-up    Feels like she is doing better overall. Still a little dizzy and some headaches, but not as bad.    Subjective:   Candice Mcgrath is a 48 y.o. very pleasant female patient with Body mass index is 31.62 kg/m. who presents with the following:  F/u concussion  She is a very pleasant patient, who I have known for many years.  I initially saw her on January 28, 2024 with acute concussion.  She had previously fallen on some concrete, she is not sure if she hit her head or not.  I did take her out for the remainder of her work week, she had spring break this week, as well.  During this time, she has done a great job with resting, and she really Things fairly calm at home.  She did go out to eat this weekend, felt pretty good with this.  Initially she did have a lot of headaches, dizziness and nausea.  She also was having a lot of photophobia and phonophobia.  At this point, she is doing much better.  She is not fully asymptomatic, but dramatically better than she was in the past.  She does endorse mild headache, dizziness, sensitivity to light and sound, feeling slowed down, bogginess, and decreased fatigue.  Total number symptoms: 7/22 Symptom severity score: 7/132  Review of Systems is noted in the HPI, as appropriate  Objective:   BP 106/70 (BP Location: Left Arm, Patient Position: Sitting, Cuff Size: Normal)   Pulse 92   Temp 98.4 F (36.9 C) (Oral)   Ht 5' 0.75" (1.543 m)   Wt 166 lb (75.3 kg)   LMP 01/24/2024   SpO2 97%   BMI 31.62 kg/m   GEN: No acute distress;  alert,appropriate. PULM: Breathing comfortably in no respiratory distress PSYCH: Normally interactive.   CN 2-12 intact NPC normal VOMS normal Mbess balancing, much improved.  Minor step down on one foot  Laboratory and Imaging Data:  Assessment and Plan:     ICD-10-CM   1. Concussion without loss of consciousness, subsequent encounter  S06.0X0D      She is improving a lot, some mild symptoms remain.  I think that she can return to work on Monday, and I have recommended that she be able to take a break every hour for roughly 10 minutes.  If she has a resurgence of symptoms and gets worse, then can always take her out of work additionally.  I think that she will likely do well.  Dragon [employer] One speech-to-text software was used for transcription in this dictation.  Possible transcriptional errors can occur using Animal nutritionist.   Signed,  Ranny Bye. Pope Brunty, MD   Outpatient Encounter Medications as of 02/07/2024  Medication Sig   albuterol  (VENTOLIN  HFA) 108 (90 Base) MCG/ACT inhaler Inhale 1-2 puffs into the lungs every 6 (six) hours as needed for wheezing or shortness of breath.   Ascorbic Acid (VITAMIN C) 100 MG tablet Take 100 mg by mouth daily.   atomoxetine  (STRATTERA ) 40 MG capsule Take 1 capsule (40 mg total) by mouth daily.  atomoxetine  (STRATTERA ) 60 MG capsule Take 1 capsule (60 mg total) by mouth daily.   Azelastine HCl 137 MCG/SPRAY SOLN Place into both nostrils.   betamethasone  valerate ointment (VALISONE ) 0.1 % APPLY TO AFFECTED AREA TWICE A DAY   busPIRone  (BUSPAR ) 5 MG tablet Take 1 tablet (5 mg total) by mouth 3 (three) times daily as needed.   chlorproMAZINE (THORAZINE) 25 MG tablet Take 25 mg by mouth daily as needed.   EPINEPHrine 0.3 mg/0.3 mL IJ SOAJ injection USE UTD PRF SYSTEMATIC REACTION.   escitalopram  (LEXAPRO ) 20 MG tablet Take 1 tablet (20 mg total) by mouth daily.   fluticasone  (FLONASE ) 50 MCG/ACT nasal spray Place into both nostrils.    hydrOXYzine (ATARAX) 25 MG tablet Take 25 mg by mouth daily as needed for itching.   hyoscyamine  (LEVSIN SL) 0.125 MG SL tablet Place 1 tablet (0.125 mg total) under the tongue every 4 (four) hours as needed.   ketorolac  (TORADOL ) 10 MG tablet Take 1 tablet (10 mg total) by mouth every 6 (six) hours as needed for moderate pain or severe pain.   levocetirizine (XYZAL ) 5 MG tablet Take 1 tablet (5 mg total) by mouth every evening.   lidocaine -prilocaine  (EMLA ) cream APPLY TOPICALLY 4 (FOUR) TIMES DAILY AS NEEDED. FOUR TIMES A DAY TO AFFECTED AREA, 1 MONTH SUPPLY   Multiple Vitamins-Minerals (MULTI-VITAMIN GUMMIES PO) Take by mouth.   PAZEO 0.7 % SOLN    Semaglutide , 2 MG/DOSE, (OZEMPIC , 2 MG/DOSE,) 8 MG/3ML SOPN Inject 2 mg into the skin once a week.   SUMAtriptan (IMITREX) 100 MG tablet Take by mouth.   tiZANidine  (ZANAFLEX ) 4 MG tablet TAKE 1 TABLET BY MOUTH EVERYDAY AT BEDTIME   triamcinolone  cream (KENALOG ) 0.1 % Apply 1 application topically 2 (two) times daily.   valACYclovir  (VALTREX ) 500 MG tablet TAKE 1 TABLET (500 MG TOTAL) BY MOUTH 2 (TWO) TIMES DAILY. FOR 3 DAYS DURING A FLARE   No facility-administered encounter medications on file as of 02/07/2024.

## 2024-02-09 ENCOUNTER — Encounter: Payer: Self-pay | Admitting: Family Medicine

## 2024-03-12 ENCOUNTER — Telehealth (HOSPITAL_BASED_OUTPATIENT_CLINIC_OR_DEPARTMENT_OTHER): Payer: 59 | Admitting: Psychiatry

## 2024-03-12 ENCOUNTER — Encounter (HOSPITAL_COMMUNITY): Payer: Self-pay | Admitting: Psychiatry

## 2024-03-12 DIAGNOSIS — F902 Attention-deficit hyperactivity disorder, combined type: Secondary | ICD-10-CM | POA: Diagnosis not present

## 2024-03-12 DIAGNOSIS — F3341 Major depressive disorder, recurrent, in partial remission: Secondary | ICD-10-CM

## 2024-03-12 DIAGNOSIS — F411 Generalized anxiety disorder: Secondary | ICD-10-CM

## 2024-03-12 MED ORDER — BUSPIRONE HCL 5 MG PO TABS: 5.0000 mg | ORAL_TABLET | Freq: Three times a day (TID) | ORAL | 2 refills | Status: DC | PRN

## 2024-03-12 MED ORDER — ESCITALOPRAM OXALATE 20 MG PO TABS: 20.0000 mg | ORAL_TABLET | Freq: Every day | ORAL | 0 refills | Status: DC

## 2024-03-12 MED ORDER — ATOMOXETINE HCL 60 MG PO CAPS
60.0000 mg | ORAL_CAPSULE | Freq: Every day | ORAL | 0 refills | Status: DC
Start: 2024-03-12 — End: 2024-06-10

## 2024-03-12 NOTE — Progress Notes (Signed)
 [facility] MD/PA/NP OP Progress Note  Virtual Visit via Video Note  I connected with Candice Mcgrath on 03/12/24 at  8:40 AM EDT by a video enabled telemedicine application and verified that I am speaking with the correct person using two identifiers.  Location: Patient: Home Provider: Home Office   I discussed the limitations of evaluation and management by telemedicine and the availability of in person appointments. The patient expressed understanding and agreed to proceed.   03/12/2024 8:43 AM Candice Mcgrath  MRN:  [id number]  Chief Complaint:  Chief Complaint  Patient presents with   Anxiety   Follow-up   Medication Refill   HPI: Patient is evaluated by video session.  She reported anxiety is manageable.  She is taking BuSpar  5 mg 3 times a day, Lexapro  20 mg daily.  She is taking Strattera  60 mg every morning and sometimes she takes 40 mg if you remember.  She is very pleased as today she has a 5-year anniversary of her sobriety.  She attended 6 AM meeting and her plan is to continue to attend more meetings.  She find out that some of the people in the meeting had multiple relapses.  She feels proud that she has been stable for past 5 years and doing very well.  Patient denies any crying spells or any feeling of hopelessness or worthlessness.  She denies any panic attack.  Patient told she has to inform the possible that she cannot take extra responsibilities as one of the coworker is out and she was helping the coworker and feeling like she is burnout.  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  Her job is challenging.  She lives with her husband.  She has a good support from her brother and sister-in-law.  Patient told she was drinking to move out to live close to her parents who live in [place] , [place] .  However after reviewing the pros and cons she had decided to stay here because houses are not cheaper there and she will take a pay cut.  She is trying to  visit her parents at least once a month on the weekend.  She admitted sometime frustration and irritability when she watch political news but trying to avoid now and that has been helpful.  She is on Ozempic  and had lost further weight since the last visit.  She feels more energetic and she is more active.  She has no tremors, shakes or any EPS.  Recently had a visit with neurology and prescribed Emgality injection once a month and that has been helping.  Patient told since started that medicine she is not taking frequently Thorazine.  Sometimes she has a itching and she takes hydroxyzine.  Her attention concentration is good.  She feels more productive at work.  Visit Diagnosis:    ICD-10-CM   1. Generalized anxiety disorder  F41.1 escitalopram  (LEXAPRO ) 20 MG tablet    busPIRone  (BUSPAR ) 5 MG tablet    2. Major depressive disorder, recurrent episode, in partial remission (HCC)  F33.41 escitalopram  (LEXAPRO ) 20 MG tablet    busPIRone  (BUSPAR ) 5 MG tablet    3. Attention deficit hyperactivity disorder (ADHD), combined type  F90.2 atomoxetine  (STRATTERA ) 60 MG capsule       Past Psychiatric History: Reviewed History of ADHD, anxiety, depression, drug and ETOH use. 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  H/O rehab in 2007.  As per chart tried Prozac  and Wellbutrin  but no details.  No history of suicidal attempt, psychosis, mania, hallucination, PTSD, panic attacks.  For past 14 years medicines were managed by her primary care physician.    Past Medical History:  Past Medical History:  Diagnosis Date   Allergic rhinitis    Anxiety    Depression    Herpes    History of alcohol abuse    [facility]   History of drug abuse in remission (HCC)    adderrall, Xanax, MJ, others, no IV   Hyperlipidemia    IBS (irritable bowel syndrome)    Migraine headache 12/24/2012   Nephrolithiasis    Substance abuse (HCC)    ETOH, Aderall pot    Past Surgical  History:  Procedure Laterality Date   Basket Surgery  2005, 06   kidney stones   TYMPANOSTOMY TUBE PLACEMENT Bilateral    as a child    Family History:  Family History  Problem Relation Age of Onset   Colon polyps Mother    Stroke Maternal Grandmother    Cancer Maternal Grandfather    Heart disease Paternal Grandfather    Breast cancer Cousin    Alcohol abuse Other        GP   Stroke Other        GGM   Colon cancer Neg Hx    Esophageal cancer Neg Hx    Rectal cancer Neg Hx    Stomach cancer Neg Hx     Social History:  Social History   Socioeconomic History   Marital status: Married    Spouse name: Not on file   Number of children: Not on file   Years of education: Not on file   Highest education level: Master's degree (e.g., MA, MS, MEng, MEd, MSW, MBA)  Occupational History   Occupation: spanish    Employer: [employer]    Comment: @ [employer]  Tobacco Use   Smoking status: Every Day    Current packs/day: 0.00    Types: Cigarettes    Last attempt to quit: 06/24/2016    Years since quitting: 7.7   Smokeless tobacco: Former    Quit date: 12/04/2008  Vaping Use   Vaping status: Every Day  Substance and Sexual Activity   Alcohol use: Not Currently    Alcohol/week: 2.0 - 4.0 standard drinks of alcohol    Types: 2 - 4 Cans of beer per week    Comment: recovering AA   Drug use: No    Comment: Recovering NA   Sexual activity: Yes  Other Topics Concern   Not on file  Social History Narrative   Not on file   Social Drivers of Health   Financial Resource Strain: Low Risk  (01/28/2024)   Overall Financial Resource Strain (CARDIA)    Difficulty of Paying Living Expenses: Not very hard  Food Insecurity: No Food Insecurity (01/28/2024)   Hunger Vital Sign    Worried About Running Out of Food in the Last Year: Never true    Ran Out of Food in the Last Year: Never true  Transportation Needs: No Transportation Needs (01/28/2024)   PRAPARE -  Administrator, Civil Service (Medical): No    Lack of Transportation (Non-Medical): No  Physical Activity: Insufficiently Active (01/28/2024)   Exercise Vital Sign    Days of Exercise per Week: 1 day    Minutes of Exercise per Session: 20 min  Stress: No Stress Concern Present (01/28/2024)   [employer] of  Occupational Health - Occupational Stress Questionnaire    Feeling of Stress : Only a little  Social Connections: Socially Integrated (01/28/2024)   Social Connection and Isolation Panel [NHANES]    Frequency of Communication with Friends and Family: Twice a week    Frequency of Social Gatherings with Friends and Family: Once a week    Attends Religious Services: 1 to 4 times per year    Active Member of [employer] or Organizations: Yes    Attends Engineer, structural: More than 4 times per year    Marital Status: Married    Allergies:  Allergies  Allergen Reactions   Sulfamethoxazole-Trimethoprim     REACTION: itching   Sulfa Antibiotics Rash    Metabolic Disorder Labs: Lab Results  Component Value Date   HGBA1C 5.7 06/21/2023   Lab Results  Component Value Date   PROLACTIN 7.3 12/26/2021   PROLACTIN 10.0 06/23/2010   Lab Results  Component Value Date   CHOL 137 06/21/2023   TRIG 71.0 06/21/2023   HDL 43.30 06/21/2023   CHOLHDL 3 06/21/2023   VLDL 14.2 06/21/2023   LDLCALC 79 06/21/2023   LDLCALC 86 12/26/2021   Lab Results  Component Value Date   TSH 2.26 12/26/2021   TSH 2.08 11/29/2020    Therapeutic Level Labs: No results found for: "LITHIUM" No results found for: "VALPROATE" No results found for: "CBMZ"  Current Medications: Current Outpatient Medications  Medication Sig Dispense Refill   albuterol  (VENTOLIN  HFA) 108 (90 Base) MCG/ACT inhaler Inhale 1-2 puffs into the lungs every 6 (six) hours as needed for wheezing or shortness of breath. 18 g 0   Ascorbic Acid (VITAMIN C) 100 MG tablet Take 100 mg by mouth daily.      atomoxetine  (STRATTERA ) 40 MG capsule Take 1 capsule (40 mg total) by mouth daily. 30 capsule 0   atomoxetine  (STRATTERA ) 60 MG capsule Take 1 capsule (60 mg total) by mouth daily. 90 capsule 0   Azelastine HCl 137 MCG/SPRAY SOLN Place into both nostrils.     betamethasone  valerate ointment (VALISONE ) 0.1 % APPLY TO AFFECTED AREA TWICE A DAY 45 g 1   busPIRone  (BUSPAR ) 5 MG tablet Take 1 tablet (5 mg total) by mouth 3 (three) times daily as needed. 90 tablet 2   chlorproMAZINE (THORAZINE) 25 MG tablet Take 25 mg by mouth daily as needed.     EPINEPHrine 0.3 mg/0.3 mL IJ SOAJ injection USE UTD PRF SYSTEMATIC REACTION.  1   escitalopram  (LEXAPRO ) 20 MG tablet Take 1 tablet (20 mg total) by mouth daily. 90 tablet 0   fluticasone  (FLONASE ) 50 MCG/ACT nasal spray Place into both nostrils.     hydrOXYzine (ATARAX) 25 MG tablet Take 25 mg by mouth daily as needed for itching.     hyoscyamine  (LEVSIN SL) 0.125 MG SL tablet Place 1 tablet (0.125 mg total) under the tongue every 4 (four) hours as needed. 30 tablet 5   ketorolac  (TORADOL ) 10 MG tablet Take 1 tablet (10 mg total) by mouth every 6 (six) hours as needed for moderate pain or severe pain. 40 tablet 0   levocetirizine (XYZAL ) 5 MG tablet Take 1 tablet (5 mg total) by mouth every evening. 90 tablet 3   lidocaine -prilocaine  (EMLA ) cream APPLY TOPICALLY 4 (FOUR) TIMES DAILY AS NEEDED. FOUR TIMES A DAY TO AFFECTED AREA, 1 MONTH SUPPLY 30 g 2   Multiple Vitamins-Minerals (MULTI-VITAMIN GUMMIES PO) Take by mouth.     PAZEO 0.7 % SOLN  3   Semaglutide , 2 MG/DOSE, (OZEMPIC , 2 MG/DOSE,) 8 MG/3ML SOPN Inject 2 mg into the skin once a week. 9 mL 3   SUMAtriptan (IMITREX) 100 MG tablet Take by mouth.     tiZANidine  (ZANAFLEX ) 4 MG tablet TAKE 1 TABLET BY MOUTH EVERYDAY AT BEDTIME 90 tablet 1   triamcinolone  cream (KENALOG ) 0.1 % Apply 1 application topically 2 (two) times daily. 30 g 0   valACYclovir  (VALTREX ) 500 MG tablet TAKE 1 TABLET (500 MG TOTAL) BY  MOUTH 2 (TWO) TIMES DAILY. FOR 3 DAYS DURING A FLARE 180 tablet 3   No current facility-administered medications for this visit.     Musculoskeletal: Strength & Muscle Tone: within normal limits Gait & Station: normal Patient leans: N/A Psychiatric Specialty Exam: Physical Exam  Review of Systems  Weight 162 lb (73.5 kg).There is no height or weight on file to calculate BMI.  General Appearance: Casual  Eye Contact:  Good  Speech:  Normal Rate  Volume:  Normal  Mood:  Euthymic  Affect:  Appropriate  Thought Process:  Goal Directed  Orientation:  Full (Time, Place, and Person)  Thought Content:  Logical  Suicidal Thoughts:  No  Homicidal Thoughts:  No  Memory:  Immediate;   Good Recent;   Good Remote;   Good  Judgement:  Intact  Insight:  Good  Psychomotor Activity:  Normal  Concentration:  Concentration: Good and Attention Span: Good  Recall:  Good  Fund of Knowledge:  Good  Language:  Good  Akathisia:  No  Handed:  Right  AIMS (if indicated):     Assets:  Communication Skills Desire for Improvement Housing Resilience Social Support Talents/Skills Transportation  ADL's:  Intact  Cognition:  WNL  Sleep:        Screenings: GAD-7    Flowsheet Row Office Visit from 01/28/2024 in [facility] [place] Office Visit from 07/12/2023 in [facility] [place] Office Visit from 04/02/2023 in [facility] [place] Office Visit from 12/06/2022 in [facility] [facility] Office Visit from 12/26/2021 in [facility] [place] [facility]  Total GAD-7 Score 5 4 6 6 7       PHQ2-9    Flowsheet Row Office Visit from 02/07/2024 in [facility] Office Visit from 01/28/2024 in [facility] Office Visit from 07/12/2023 in [facility] Office Visit from 04/02/2023 in [facility] Office Visit from 12/06/2022 in [facility] [place]  PHQ-2 Total Score 0 2 1 2 2   PHQ-9 Total Score -- 6 2 6 7       Flowsheet Row UC from 07/15/2023 in [facility] Visit from 05/05/2022 in [facility] UC from 11/13/2021 in [facility] RISK CATEGORY No Risk No Risk No Risk        Assessment and Plan: Patient is 48 year old with history of migraine, diabetes mellitus type 2, IBS, hyperlipidemia and distant history of alcohol abuse.  Reviewed blood work results, current medication.  She has been doing very well and recently had a 5-year of sobriety.  Congratulations given.  She had lost weight on Ozempic .  She is taking Strattera  60 mg in the morning and if there is sometimes forgets to take 40 mg in  the afternoon.  She like to keep the BuSpar  5 mg 3 times daily and Lexapro  20 mg daily.  She is prescribed Thorazine to help headaches and hydroxyzine to help with itching which she takes only as needed.  Discussed medication side effects and benefits.  She is happy as only 7 days left in school session.  Patient has no major concern from the medication.  Discussed medication side effects and benefits.  Recommend to call us  back with any question or any concern.  Follow-up in 3 months.  Collaboration of Care: Collaboration of Care: Other provider involved in patient's care AEB notes are available in epic to review  Patient/Guardian was advised Release of Information must be obtained prior to any record release in order to collaborate their care with an outside provider. Patient/Guardian was advised if they have not already done so to contact the registration department to sign all necessary forms in order for us  to release information regarding their care.   Consent: Patient/Guardian gives verbal consent for treatment and assignment of  benefits for services provided during this visit. Patient/Guardian expressed understanding and agreed to proceed.     Follow Up Instructions:    I discussed the assessment and treatment plan with the patient. The patient was provided an opportunity to ask questions and all were answered. The patient agreed with the plan and demonstrated an understanding of the instructions.   The patient was advised to call back or seek an in-person evaluation if the symptoms worsen or if the condition fails to improve as anticipated.  I provided 25 minutes of non-face-to-face time during this encounter.   Arturo Late, MD    Arturo Late, MD 03/12/2024, 8:43 AM

## 2024-04-19 ENCOUNTER — Encounter: Payer: Self-pay | Admitting: Family Medicine

## 2024-06-10 ENCOUNTER — Telehealth (HOSPITAL_BASED_OUTPATIENT_CLINIC_OR_DEPARTMENT_OTHER): Admitting: Psychiatry

## 2024-06-10 ENCOUNTER — Encounter (HOSPITAL_COMMUNITY): Payer: Self-pay | Admitting: Psychiatry

## 2024-06-10 VITALS — Wt 160.0 lb

## 2024-06-10 DIAGNOSIS — F3341 Major depressive disorder, recurrent, in partial remission: Secondary | ICD-10-CM

## 2024-06-10 DIAGNOSIS — F411 Generalized anxiety disorder: Secondary | ICD-10-CM

## 2024-06-10 DIAGNOSIS — F902 Attention-deficit hyperactivity disorder, combined type: Secondary | ICD-10-CM | POA: Diagnosis not present

## 2024-06-10 MED ORDER — ATOMOXETINE HCL 60 MG PO CAPS: 60.0000 mg | ORAL_CAPSULE | Freq: Every day | ORAL | 0 refills | Status: DC

## 2024-06-10 MED ORDER — ESCITALOPRAM OXALATE 20 MG PO TABS: 20.0000 mg | ORAL_TABLET | Freq: Every day | ORAL | 0 refills | Status: DC

## 2024-06-10 MED ORDER — BUSPIRONE HCL 5 MG PO TABS: 5.0000 mg | ORAL_TABLET | Freq: Three times a day (TID) | ORAL | 2 refills | Status: DC | PRN

## 2024-06-10 NOTE — Progress Notes (Signed)
 [facility] MD Virtual Progress Note   Patient Location: Home Provider Location: Home Office  I connect with patient by video and verified that I am speaking with correct person by using two identifiers. I discussed the limitations of evaluation and management by telemedicine and the availability of in person appointments. I also discussed with the patient that there may be a patient responsible charge related to this service. The patient expressed understanding and agreed to proceed.  Candice Mcgrath [id number] 48 y.o.  06/10/2024 8:40 AM  History of Present Illness:  Patient is evaluated by video session.  She reported summer was busy and she was very active.  She has some concern because school started and today was her open house.  Patient has some concern about her job future because funding is cut down and they are already short staffed.  Patient teaches Spanish in the school.  She is taking the medication and she feels it is working and helping.  She realized that she need to handle the situation as best she can and cannot get worried unnecessary.  She denies any major panic attack.  She still had a family reunion in [place]  and that was a good time.  She continues to go to [employer].  Sometimes she noticed around her monthly cycle get irritable and low mood but usually it get better in few days.  She is sleeping good.  She is taking Lexapro , BuSpar  and Strattera  60 mg.  She is going to take 40 mg extra Strattera  when she is going to take afternoon classes.  Her appetite is okay.  She believes lost 2 pounds during the summer because she is active and try to keep herself busy.  She has no rash, itching, tremors, shakes or any EPS.  She denies any panic attack.  Her attention concentration is good.  Patient lives with her husband.  She has a good support from her brother and sister-in-law.  She is also on hydroxyzine and Thorazine for itching and headaches but has not  taking regularly since her headaches medicine are changed and doing very well.  She is on [employer]  Past Psychiatric History: History of ADHD, anxiety, depression, drug and ETOH use. 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 Meds were given by PCP. H/O rehab in 2007.  As per chart tried Prozac  and Wellbutrin  but no details. No history of suicidal attempt, psychosis, mania, hallucination, PTSD, panic attacks.     Past Medical History:  Diagnosis Date   Allergic rhinitis    Anxiety    Depression    Herpes    History of alcohol abuse    [facility]   History of drug abuse in remission (HCC)    adderrall, Xanax, MJ, others, no IV   Hyperlipidemia    IBS (irritable bowel syndrome)    Migraine headache 12/24/2012   Nephrolithiasis    Substance abuse (HCC)    ETOH, Aderall pot    Outpatient Encounter Medications as of 06/10/2024  Medication Sig   albuterol  (VENTOLIN  HFA) 108 (90 Base) MCG/ACT inhaler Inhale 1-2 puffs into the lungs every 6 (six) hours as needed for wheezing or shortness of breath.   Ascorbic Acid (VITAMIN C) 100 MG tablet Take 100 mg by mouth daily.   atomoxetine  (STRATTERA ) 40 MG capsule Take 1 capsule (40 mg total) by mouth daily.   atomoxetine  (STRATTERA ) 60 MG capsule Take 1 capsule (60 mg total) by mouth daily.   Azelastine HCl 137  MCG/SPRAY SOLN Place into both nostrils.   betamethasone  valerate ointment (VALISONE ) 0.1 % APPLY TO AFFECTED AREA TWICE A DAY   busPIRone  (BUSPAR ) 5 MG tablet Take 1 tablet (5 mg total) by mouth 3 (three) times daily as needed.   chlorproMAZINE (THORAZINE) 25 MG tablet Take 25 mg by mouth daily as needed.   EMGALITY 120 MG/ML SOAJ every 30 (thirty) days.   EPINEPHrine 0.3 mg/0.3 mL IJ SOAJ injection USE UTD PRF SYSTEMATIC REACTION.   escitalopram  (LEXAPRO ) 20 MG tablet Take 1 tablet (20 mg total) by mouth daily.   fluticasone  (FLONASE ) 50 MCG/ACT nasal spray Place into both nostrils.   hydrOXYzine (ATARAX) 25 MG  tablet Take 25 mg by mouth daily as needed for itching.   hyoscyamine  (LEVSIN  SL) 0.125 MG SL tablet Place 1 tablet (0.125 mg total) under the tongue every 4 (four) hours as needed.   ketorolac  (TORADOL ) 10 MG tablet Take 1 tablet (10 mg total) by mouth every 6 (six) hours as needed for moderate pain or severe pain.   levocetirizine (XYZAL ) 5 MG tablet Take 1 tablet (5 mg total) by mouth every evening.   lidocaine -prilocaine  (EMLA ) cream APPLY TOPICALLY 4 (FOUR) TIMES DAILY AS NEEDED. FOUR TIMES A DAY TO AFFECTED AREA, 1 MONTH SUPPLY   Multiple Vitamins-Minerals (MULTI-VITAMIN GUMMIES PO) Take by mouth.   PAZEO 0.7 % SOLN    Semaglutide , 2 MG/DOSE, (OZEMPIC , 2 MG/DOSE,) 8 MG/3ML SOPN Inject 2 mg into the skin once a week.   SUMAtriptan (IMITREX) 100 MG tablet Take by mouth.   tiZANidine  (ZANAFLEX ) 4 MG tablet TAKE 1 TABLET BY MOUTH EVERYDAY AT BEDTIME   triamcinolone  cream (KENALOG ) 0.1 % Apply 1 application topically 2 (two) times daily.   valACYclovir  (VALTREX ) 500 MG tablet TAKE 1 TABLET (500 MG TOTAL) BY MOUTH 2 (TWO) TIMES DAILY. FOR 3 DAYS DURING A FLARE   No facility-administered encounter medications on file as of 06/10/2024.    No results found for this or any previous visit (from the past 2160 hours).   Psychiatric Specialty Exam: Physical Exam  Review of Systems  Weight 160 lb (72.6 kg).There is no height or weight on file to calculate BMI.  General Appearance: Casual  Eye Contact:  Good  Speech:  Clear and Coherent  Volume:  Normal  Mood:  Anxious  Affect:  Appropriate  Thought Process:  Goal Directed  Orientation:  Full (Time, Place, and Person)  Thought Content:  Logical  Suicidal Thoughts:  No  Homicidal Thoughts:  No  Memory:  Immediate;   Good Recent;   Good Remote;   Good  Judgement:  Good  Insight:  Good  Psychomotor Activity:  Normal  Concentration:  Concentration: Good and Attention Span: Good  Recall:  Good  Fund of Knowledge:  Good  Language:  Good   Akathisia:  No  Handed:  Right  AIMS (if indicated):     Assets:  Communication Skills Desire for Improvement Housing Social Support Talents/Skills Transportation  ADL's:  Intact  Cognition:  WNL  Sleep:  ok       02/07/2024    2:09 PM 01/28/2024    9:31 AM 07/12/2023    3:59 PM 04/02/2023   11:34 AM 12/06/2022   10:34 AM  Depression screen PHQ 2/9  Decreased Interest 0 1 1 1 1   Down, Depressed, Hopeless 0 1 0 1 1  PHQ - 2 Score 0 2 1 2 2   Altered sleeping  1 0 1 1  Tired, decreased  energy  1 1 1 1   Change in appetite  1 0 1 1  Feeling bad or failure about yourself   0 0 0 0  Trouble concentrating  1 0 1 1  Moving slowly or fidgety/restless  0 0 0 1  Suicidal thoughts  0 0 0 0  PHQ-9 Score  6 2 6 7   Difficult doing work/chores  Somewhat difficult Somewhat difficult Somewhat difficult Somewhat difficult    Assessment/Plan: Major depressive disorder, recurrent episode, in partial remission (HCC) - Plan: busPIRone  (BUSPAR ) 5 MG tablet, escitalopram  (LEXAPRO ) 20 MG tablet  Attention deficit hyperactivity disorder (ADHD), combined type - Plan: atomoxetine  (STRATTERA ) 60 MG capsule  Generalized anxiety disorder - Plan: busPIRone  (BUSPAR ) 5 MG tablet, escitalopram  (LEXAPRO ) 20 MG tablet  Patient is 48 year old with history of migraine,, diabetes mellitus, major depressive disorder, ADHD, generalized anxiety disorder and distant history of alcohol abuse.  Discussed mood lability around her monthly cycle and patient is going to contact her OB/GYN to check her hormones level.  Patient is stable on current medication.  Discussed concern related to her job future and reassurance provided.  She does not want to change the medication since it is working well.  Will continue BuSpar  5 mg 3 times a day, Lexapro  20 mg daily and Strattera  60 mg in the morning.  Sometimes she takes 40 mg in the afternoon when she take classes.  She is also on Thorazine for headaches and hydroxyzine for itching  prescribed by primary care but has not taken in a while.  Discussed medication side effects and benefits.  Recommended to call back if she is any question or any concern.  Follow-up in 3 months   Follow Up Instructions:     I discussed the assessment and treatment plan with the patient. The patient was provided an opportunity to ask questions and all were answered. The patient agreed with the plan and demonstrated an understanding of the instructions.   The patient was advised to call back or seek an in-person evaluation if the symptoms worsen or if the condition fails to improve as anticipated.    Collaboration of Care: Other provider involved in patient's care AEB notes are available in epic to review  Patient/Guardian was advised Release of Information must be obtained prior to any record release in order to collaborate their care with an outside provider. Patient/Guardian was advised if they have not already done so to contact the registration department to sign all necessary forms in order for us  to release information regarding their care.   Consent: Patient/Guardian gives verbal consent for treatment and assignment of benefits for services provided during this visit. Patient/Guardian expressed understanding and agreed to proceed.     Total encounter time 20 minutes which includes face-to-face time, chart reviewed, care coordination, order entry and documentation during this encounter.   Note: This document was prepared by [employer] voice dictation technology and any errors that results from this process are unintentional.    Leni ONEIDA Client, MD 06/10/2024

## 2024-06-11 ENCOUNTER — Other Ambulatory Visit: Payer: Self-pay | Admitting: Family Medicine

## 2024-06-11 NOTE — Telephone Encounter (Signed)
 Last office visit 02/07/2024 for Concussion.  Last refilled 12/18/23 for #90 with 1 refill.  Next appt: No future appointments with PCP.

## 2024-07-13 ENCOUNTER — Other Ambulatory Visit (HOSPITAL_COMMUNITY): Payer: Self-pay | Admitting: Psychiatry

## 2024-07-13 DIAGNOSIS — F902 Attention-deficit hyperactivity disorder, combined type: Secondary | ICD-10-CM

## 2024-07-23 ENCOUNTER — Ambulatory Visit: Admitting: Obstetrics and Gynecology

## 2024-07-23 ENCOUNTER — Encounter: Payer: Self-pay | Admitting: Obstetrics and Gynecology

## 2024-07-23 ENCOUNTER — Other Ambulatory Visit (HOSPITAL_COMMUNITY)
Admission: RE | Admit: 2024-07-23 | Discharge: 2024-07-23 | Disposition: A | Source: Ambulatory Visit | Attending: Obstetrics and Gynecology | Admitting: Obstetrics and Gynecology

## 2024-07-23 VITALS — BP 103/72 | HR 86 | Ht 61.0 in | Wt 165.0 lb

## 2024-07-23 DIAGNOSIS — L292 Pruritus vulvae: Secondary | ICD-10-CM | POA: Diagnosis not present

## 2024-07-23 DIAGNOSIS — Z01419 Encounter for gynecological examination (general) (routine) without abnormal findings: Secondary | ICD-10-CM | POA: Insufficient documentation

## 2024-07-23 DIAGNOSIS — N898 Other specified noninflammatory disorders of vagina: Secondary | ICD-10-CM | POA: Diagnosis not present

## 2024-07-23 NOTE — Addendum Note (Signed)
 Addended by: Sujay Grundman J on: 07/23/2024 02:31 PM   Modules accepted: Orders

## 2024-07-23 NOTE — Progress Notes (Signed)
 Subjective:     Candice Mcgrath is a 48 y.o. female with LMP 06/30/24 and BMI 31 who is here for a comprehensive physical exam. The patient reports a monthly period lasting 3-5 days. She is sexually active using vasectomy for contraception. She admits to a decrease in libido since starting Lexapro  which has helped her depression tremendously. Patient denies pelvic pain or abnormal discharge. She admits to having PMDD which has improved with Buspar  but is still present.  Past Medical History:  Diagnosis Date   Allergic rhinitis    Anxiety    Depression    Herpes    History of alcohol abuse    [facility]   History of drug abuse in remission (HCC)    adderrall, Xanax, MJ, others, no IV   Hyperlipidemia    IBS (irritable bowel syndrome)    Migraine headache 12/24/2012   Nephrolithiasis    Substance abuse (HCC)    ETOH, Aderall pot   Past Surgical History:  Procedure Laterality Date   Basket Surgery  2005, 06   kidney stones   TYMPANOSTOMY TUBE PLACEMENT Bilateral    as a child   Family History  Problem Relation Age of Onset   Colon polyps Mother    Stroke Maternal Grandmother    Cancer Maternal Grandfather    Heart disease Paternal Grandfather    Breast cancer Cousin    Alcohol abuse Other        GP   Stroke Other        GGM   Colon cancer Neg Hx    Esophageal cancer Neg Hx    Rectal cancer Neg Hx    Stomach cancer Neg Hx     Social History   Socioeconomic History   Marital status: Married    Spouse name: Not on file   Number of children: Not on file   Years of education: Not on file   Highest education level: Master's degree (e.g., MA, MS, MEng, MEd, MSW, MBA)  Occupational History   Occupation: spanish    Employer: [employer]    Comment: @ [employer]  Tobacco Use   Smoking status: Every Day    Current packs/day: 0.00    Types: Cigarettes    Last attempt to quit: 06/24/2016    Years since quitting: 8.0    Smokeless tobacco: Current    Last attempt to quit: 12/04/2008  Vaping Use   Vaping status: Every Day  Substance and Sexual Activity   Alcohol use: Not Currently    Alcohol/week: 2.0 - 4.0 standard drinks of alcohol    Types: 2 - 4 Cans of beer per week    Comment: recovering AA   Drug use: No    Comment: Recovering NA   Sexual activity: Yes  Other Topics Concern   Not on file  Social History Narrative   Not on file   Social Drivers of Health   Financial Resource Strain: Low Risk  (01/28/2024)   Overall Financial Resource Strain (CARDIA)    Difficulty of Paying Living Expenses: Not very hard  Food Insecurity: No Food Insecurity (01/28/2024)   Hunger Vital Sign    Worried About Running Out of Food in the Last Year: Never true    Ran Out of Food in the Last Year: Never true  Transportation Needs: No Transportation Needs (01/28/2024)   PRAPARE - Administrator, Civil Service (Medical): No    Lack of Transportation (Non-Medical):  No  Physical Activity: Insufficiently Active (01/28/2024)   Exercise Vital Sign    Days of Exercise per Week: 1 day    Minutes of Exercise per Session: 20 min  Stress: No Stress Concern Present (01/28/2024)   [employer] of Occupational Health - Occupational Stress Questionnaire    Feeling of Stress : Only a little  Social Connections: Socially Integrated (01/28/2024)   Social Connection and Isolation Panel    Frequency of Communication with Friends and Family: Twice a week    Frequency of Social Gatherings with Friends and Family: Once a week    Attends Religious Services: 1 to 4 times per year    Active Member of [employer] or Organizations: Yes    Attends Engineer, structural: More than 4 times per year    Marital Status: Married  Catering manager Violence: Not on file   Health Maintenance  Topic Date Due   OPHTHALMOLOGY EXAM  Never done   Diabetic kidney evaluation - Urine ACR  Never done   Hepatitis B Vaccines 19-59 Average Risk (2  of 3 - 19+ 3-dose series) 05/02/2017   Mammogram  09/02/2020   HEMOGLOBIN A1C  12/21/2023   Influenza Vaccine  05/23/2024   Diabetic kidney evaluation - eGFR measurement  06/20/2024   COVID-19 Vaccine (5 - 2025-26 season) 06/23/2024   FOOT EXAM  07/11/2024   Cervical Cancer Screening (HPV/Pap Cotest)  12/03/2024   DTaP/Tdap/Td (2 - Td or Tdap) 04/05/2027   Colonoscopy  09/06/2030   Pneumococcal Vaccine  Completed   Hepatitis C Screening  Completed   HIV Screening  Completed   HPV VACCINES  Aged Out   Meningococcal B Vaccine  Aged Out       Review of Systems Pertinent items noted in HPI and remainder of comprehensive ROS otherwise negative.   Objective:  Blood pressure 103/72, pulse 86, height 5' 1 (1.549 m), weight 165 lb (74.8 kg), last menstrual period 06/30/2024.   GENERAL: Well-developed, well-nourished female in no acute distress.  HEENT: Normocephalic, atraumatic. Sclerae anicteric.  NECK: Supple. Normal thyroid .  LUNGS: Clear to auscultation bilaterally.  HEART: Regular rate and rhythm. BREASTS: Symmetric in size. No palpable masses or lymphadenopathy, skin changes, or nipple drainage. ABDOMEN: Soft, nontender, nondistended. No organomegaly. PELVIC: Normal external female genitalia. Vagina is pink and rugated.  Normal discharge. Normal appearing cervix. Uterus is normal in size. No adnexal mass or tenderness. Chaperone present during the pelvic exam EXTREMITIES: No cyanosis, clubbing, or edema, 2+ distal pulses.     Assessment:    Healthy female exam.      Plan:    Pap smear collected Screening mammogram ordered STI screening per patient request Patient will be contacted with abnormal results Patient reports a normal colonoscopy with polyp removal last year See After Visit Summary for Counseling Recommendations

## 2024-07-23 NOTE — Addendum Note (Signed)
 Addended by: Parris Cudworth J on: 07/23/2024 02:41 PM   Modules accepted: Orders

## 2024-07-23 NOTE — Progress Notes (Addendum)
 48 y.o. New GYN presents for AEX/PAP/STD Screenings.  C/o mood swings, night sweats, brain fog, low libido, painful sex x 1+ year. Dx with PMDD.  PHQ-9=6  //GAD-7=6  Pt is already receiving treatment.

## 2024-07-24 LAB — CERVICOVAGINAL ANCILLARY ONLY
Chlamydia: NEGATIVE
Comment: NEGATIVE
Comment: NEGATIVE
Comment: NORMAL
Neisseria Gonorrhea: NEGATIVE
Trichomonas: NEGATIVE

## 2024-07-24 LAB — TSH: TSH: 1.34 u[IU]/mL (ref 0.450–4.500)

## 2024-07-25 LAB — CYTOLOGY - PAP
Comment: NEGATIVE
Diagnosis: NEGATIVE
Diagnosis: REACTIVE
High risk HPV: NEGATIVE

## 2024-08-01 ENCOUNTER — Ambulatory Visit

## 2024-08-25 ENCOUNTER — Telehealth (HOSPITAL_COMMUNITY): Payer: Self-pay | Admitting: *Deleted

## 2024-08-25 ENCOUNTER — Other Ambulatory Visit (HOSPITAL_COMMUNITY): Payer: Self-pay | Admitting: *Deleted

## 2024-08-25 ENCOUNTER — Ambulatory Visit
Admission: RE | Admit: 2024-08-25 | Discharge: 2024-08-25 | Disposition: A | Discharge status: Home / Self Care | Source: Ambulatory Visit | Attending: Obstetrics and Gynecology | Admitting: Obstetrics and Gynecology

## 2024-08-25 DIAGNOSIS — Z01419 Encounter for gynecological examination (general) (routine) without abnormal findings: Secondary | ICD-10-CM

## 2024-08-25 MED ORDER — BUPROPION HCL ER (XL) 150 MG PO TB24
150.0000 mg | ORAL_TABLET | Freq: Every day | ORAL | 0 refills | Status: DC
Start: 2024-08-25 — End: 2024-09-09

## 2024-08-25 NOTE — Telephone Encounter (Signed)
 Pt called with c/o feeling super low in mood. Also states that she has been struggling recently which is unusual as she is usually excited about the holidays. Pt says this is absolutely not like her and therefore knows something is off. Denies SI. Pt is requesting advise, medication change or increase. Please review and advise.    Last visit: 06/10/24 Next visit: 09/09/24

## 2024-08-25 NOTE — Telephone Encounter (Signed)
 LVM for pt with advising the addition of the Wellbutrin  XL 150 mg and continue current med regime. Also advised of earlier appointment if Wellbutrin  not helping within the next week. Rx sent to [facility] on [address] per pharmacy preference on chart. Pt encouraged to call this nurse back with any questions or concerns.

## 2024-08-25 NOTE — Telephone Encounter (Signed)
 I can add low-dose Wellbutrin  XL 150 in the morning.  She will continue Strattera , BuSpar  and Lexapro .  She has appointment coming up on November 18 however if symptoms do not improve with the addition of Wellbutrin  that I need to see her sooner.  If she agree with the Wellbutrin  then please call local pharmacy for 30 days.  Thank you

## 2024-09-01 ENCOUNTER — Telehealth (HOSPITAL_COMMUNITY): Payer: Self-pay | Admitting: *Deleted

## 2024-09-01 NOTE — Telephone Encounter (Signed)
 She can take Wellbutrin  first thing in morning with food and two hours later she can take Strattera . If no improvement, need to see earlier.

## 2024-09-01 NOTE — Telephone Encounter (Signed)
 Pt says she spoke with her pharmacist who told her not to take the Wellbutrin  and the Strattera  at the same time. Pt would like to know how to take these two medications. Pt tried taking Wellbutrin  at HS but it kept her awake. Please review and advise.   Last visit: 06/10/24 Next visit: 09/09/24

## 2024-09-02 NOTE — Telephone Encounter (Signed)
 Pt verbalizes understanding

## 2024-09-09 ENCOUNTER — Telehealth (HOSPITAL_BASED_OUTPATIENT_CLINIC_OR_DEPARTMENT_OTHER): Admitting: Psychiatry

## 2024-09-09 ENCOUNTER — Encounter (HOSPITAL_COMMUNITY): Payer: Self-pay | Admitting: Psychiatry

## 2024-09-09 VITALS — Wt 159.0 lb

## 2024-09-09 DIAGNOSIS — F902 Attention-deficit hyperactivity disorder, combined type: Secondary | ICD-10-CM | POA: Diagnosis not present

## 2024-09-09 DIAGNOSIS — F3341 Major depressive disorder, recurrent, in partial remission: Secondary | ICD-10-CM

## 2024-09-09 DIAGNOSIS — F411 Generalized anxiety disorder: Secondary | ICD-10-CM | POA: Diagnosis not present

## 2024-09-09 MED ORDER — ATOMOXETINE HCL 60 MG PO CAPS: 60.0000 mg | ORAL_CAPSULE | Freq: Every day | ORAL | 0 refills | Status: DC

## 2024-09-09 MED ORDER — BUSPIRONE HCL 5 MG PO TABS: 5.0000 mg | ORAL_TABLET | Freq: Three times a day (TID) | ORAL | 2 refills | Status: DC | PRN

## 2024-09-09 MED ORDER — BUPROPION HCL ER (XL) 150 MG PO TB24: 150.0000 mg | ORAL_TABLET | Freq: Every day | ORAL | 2 refills | Status: DC

## 2024-09-09 MED ORDER — ESCITALOPRAM OXALATE 20 MG PO TABS: 20.0000 mg | ORAL_TABLET | Freq: Every day | ORAL | 0 refills | Status: DC

## 2024-09-09 NOTE — Progress Notes (Signed)
 [name]   Patient Location: Work Provider Location: Home Office  I connect with patient by video and verified that I am speaking with correct person by using two identifiers. I discussed the limitations of evaluation and management by telemedicine and the availability of in person appointments. I also discussed with the patient that there may be a patient responsible charge related to this service. The patient expressed understanding and agreed to proceed.  Candice Mcgrath [id number] 48 y.o.  09/09/2024 8:41 AM  History of Present Illness:  Patient is evaluated by video session.  She reported had a meltdown last week at work and she has to leave early.  She was thinking to check herself and but decided to talk to her counselor who talk to her out of it and patient did not go to hospital.  She called our office and we recommend to try Wellbutrin  to help with anxiety.  Patient told at that time to many things going on.  Her mother had knee replacement and she had a fall and no one knew that she had a fracture of the femur.  Later she had more complication but now she is doing better and getting physical therapy.  Patient reported her parents live few hours away and it is very difficult for her to visit them on a regular basis.  Patient told her father is also not doing very well and may need knee replacement.  Patient's brother also have some breathing issues and sometimes she is concerned about him.  Her therapist recommend that she need to focus on her own health and that is how she has been doing.  She also feels the Wellbutrin  has helped her a lot.  She is back to work and she had a lot of support from her coworkers.  She is sleeping better.  She denies any major panic attacks since that day.  She denies any hopelessness, worthlessness.  She is taking Strattera  60 mg in the morning.  Occasionally she takes 40 mg when she has to take afternoon classes.   She reported Wellbutrin  had helped her overall anxiety depression and she has no longer crying spells.  Patient is also on Thorazine and hydroxyzine for itching and headaches but lately has not taken because once a month injection Emgality had helped her a lot.  She is on generic Ozempic  and lost 6 pounds since the last visit.  She denies any tremors, shakes or any EPS.  She is on Lexapro , BuSpar , Strattera  and now Wellbutrin .  She is is in therapy with Devere at [facility].  She remain sober from drinking.  Past Psychiatric History: History of ADHD, anxiety, depression, drug and ETOH use. 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 Meds were given by PCP. H/O rehab in 2007.  As per chart tried Prozac  and Wellbutrin  but no details. No history of suicidal attempt, psychosis, mania, hallucination, PTSD, panic attacks.     Past Medical History:  Diagnosis Date   Allergic rhinitis    Anxiety    Asthma    Depression    Herpes    History of alcohol abuse    [facility]   History of drug abuse in remission (HCC)    adderrall, Xanax, MJ, others, no IV   Hyperlipidemia    IBS (irritable bowel syndrome)    Migraine headache 12/24/2012   Nephrolithiasis    Substance abuse (HCC)    ETOH, Aderall pot  Outpatient Encounter Medications as of 09/09/2024  Medication Sig   albuterol  (VENTOLIN  HFA) 108 (90 Base) MCG/ACT inhaler Inhale 1-2 puffs into the lungs every 6 (six) hours as needed for wheezing or shortness of breath.   Ascorbic Acid (VITAMIN C) 100 MG tablet Take 100 mg by mouth daily.   atomoxetine  (STRATTERA ) 40 MG capsule Take 1 capsule (40 mg total) by mouth daily.   atomoxetine  (STRATTERA ) 60 MG capsule Take 1 capsule (60 mg total) by mouth daily.   Azelastine HCl 137 MCG/SPRAY SOLN Place into both nostrils.   betamethasone  valerate ointment (VALISONE ) 0.1 % APPLY TO AFFECTED AREA TWICE A DAY   buPROPion  (WELLBUTRIN  XL) 150 MG 24 hr tablet Take 1 tablet (150 mg  total) by mouth daily.   busPIRone  (BUSPAR ) 5 MG tablet Take 1 tablet (5 mg total) by mouth 3 (three) times daily as needed.   chlorproMAZINE (THORAZINE) 25 MG tablet Take 25 mg by mouth daily as needed.   EMGALITY 120 MG/ML SOAJ every 30 (thirty) days.   EPINEPHrine 0.3 mg/0.3 mL IJ SOAJ injection USE UTD PRF SYSTEMATIC REACTION.   escitalopram  (LEXAPRO ) 20 MG tablet Take 1 tablet (20 mg total) by mouth daily.   fluticasone  (FLONASE ) 50 MCG/ACT nasal spray Place into both nostrils.   hydrOXYzine (ATARAX) 25 MG tablet Take 25 mg by mouth daily as needed for itching.   hyoscyamine  (LEVSIN  SL) 0.125 MG SL tablet Place 1 tablet (0.125 mg total) under the tongue every 4 (four) hours as needed.   ketorolac  (TORADOL ) 10 MG tablet Take 1 tablet (10 mg total) by mouth every 6 (six) hours as needed for moderate pain or severe pain.   levocetirizine (XYZAL ) 5 MG tablet Take 1 tablet (5 mg total) by mouth every evening.   lidocaine -prilocaine  (EMLA ) cream APPLY TOPICALLY 4 (FOUR) TIMES DAILY AS NEEDED. FOUR TIMES A DAY TO AFFECTED AREA, 1 MONTH SUPPLY   Multiple Vitamins-Minerals (MULTI-VITAMIN GUMMIES PO) Take by mouth.   PAZEO 0.7 % SOLN    Semaglutide , 2 MG/DOSE, (OZEMPIC , 2 MG/DOSE,) 8 MG/3ML SOPN Inject 2 mg into the skin once a week.   SUMAtriptan (IMITREX) 100 MG tablet Take by mouth.   tiZANidine  (ZANAFLEX ) 4 MG tablet TAKE 1 TABLET BY MOUTH EVERYDAY AT BEDTIME   triamcinolone  cream (KENALOG ) 0.1 % Apply 1 application topically 2 (two) times daily.   valACYclovir  (VALTREX ) 500 MG tablet TAKE 1 TABLET (500 MG TOTAL) BY MOUTH 2 (TWO) TIMES DAILY. FOR 3 DAYS DURING A FLARE   No facility-administered encounter medications on file as of 09/09/2024.    Recent Results (from the past 2160 hours)  Cytology - PAP( [place])     Status: None   Collection Time: 07/23/24  2:21 PM  Result Value Ref Range   High risk HPV Negative    Adequacy      Satisfactory for evaluation; transformation zone  component PRESENT.   Diagnosis      - Negative for Intraepithelial Lesions or Malignancy (NILM)   Diagnosis - Benign reactive/reparative changes    Comment Cellular changes consistent with hyperkeratosis.    Comment Normal Reference Range HPV - Negative   TSH     Status: None   Collection Time: 07/23/24  2:29 PM  Result Value Ref Range   TSH 1.340 0.450 - 4.500 uIU/mL  Cervicovaginal ancillary only( [place])     Status: None   Collection Time: 07/23/24  2:40 PM  Result Value Ref Range   Neisseria Gonorrhea Negative  Chlamydia Negative    Trichomonas Negative    Comment Normal Reference Range Trichomonas - Negative    Comment Normal Reference [place] Chlamydia - Negative    Comment      Normal Reference Range Neisseria Gonorrhea - Negative     Psychiatric Specialty Exam: Physical Exam  Review of Systems  Weight 159 lb (72.1 kg).There is no height or weight on file to calculate BMI.  General Appearance: Casual  Eye Contact:  Good  Speech:  Clear and Coherent and Normal Rate  Volume:  Normal  Mood:  Anxious  Affect:  Appropriate  Thought Process:  Goal Directed  Orientation:  Full (Time, Place, and Person)  Thought Content:  Rumination  Suicidal Thoughts:  No  Homicidal Thoughts:  No  Memory:  Immediate;   Good Recent;   Good Remote;   Good  Judgement:  Intact  Insight:  Present  Psychomotor Activity:  Normal  Concentration:  Concentration: Good and Attention Span: Good  Recall:  Good  Fund of Knowledge:  Good  Language:  Good  Akathisia:  No  Handed:  Right  AIMS (if indicated):     Assets:  Communication Skills Desire for Improvement Housing Resilience Social Support Talents/Skills Transportation  ADL's:  Intact  Cognition:  WNL  Sleep:  ok       07/23/2024    1:59 PM 02/07/2024    2:09 PM 01/28/2024    9:31 AM 07/12/2023    3:59 PM 04/02/2023   11:34 AM  Depression screen PHQ 2/9  Decreased Interest 1 0 1 1 1   Down, Depressed, Hopeless 1 0 1 0 1   PHQ - 2 Score 2 0 2 1 2   Altered sleeping 1  1 0 1  Tired, decreased energy 1  1 1 1   Change in appetite 1  1 0 1  Feeling bad or failure about yourself  0  0 0 0  Trouble concentrating 1  1 0 1  Moving slowly or fidgety/restless 0  0 0 0  Suicidal thoughts 0  0 0 0  PHQ-9 Score 6   6  2  6    Difficult doing work/chores Not difficult at all  Somewhat difficult Somewhat difficult Somewhat difficult     Data saved with a previous flowsheet row definition    Assessment/Plan: Major depressive disorder, recurrent episode, in partial remission - Plan: buPROPion  (WELLBUTRIN  XL) 150 MG 24 hr tablet, busPIRone  (BUSPAR ) 5 MG tablet, escitalopram  (LEXAPRO ) 20 MG tablet  Generalized anxiety disorder - Plan: buPROPion  (WELLBUTRIN  XL) 150 MG 24 hr tablet, busPIRone  (BUSPAR ) 5 MG tablet, escitalopram  (LEXAPRO ) 20 MG tablet  Attention deficit hyperactivity disorder (ADHD), combined type - Plan: buPROPion  (WELLBUTRIN  XL) 150 MG 24 hr tablet, atomoxetine  (STRATTERA ) 60 MG capsule  Patient is 48 year old employed married female with history of migraine, diabetes, major depressive disorder, ADHD, generalized anxiety disorder and distant history of alcohol use.  Review psychosocial stressors.  She is doing better with the addition of Wellbutrin .  She has no tremor or shakes or any EPS.  Discussed polypharmacy can cause tremors and serotonin syndrome.  We talk about stopping the Strattera  and increasing the Wellbutrin  to help with ADHD and anxiety but patient like to wait until the holidays are or.  She feels the current combination is working very well for her.  So far tolerating all her medication and reported no side effects.  Her plan is to visit her parents and holidays.  She had significantly cut down her  hydroxyzine and Thorazine since she does not have itching anymore and her headaches are better controlled with the injection once a month.  I recommend not to take the extra Strattera  40 mg since Wellbutrin   can help with the attention and focus.  Patient agree with the plan.  Will follow-up in 3 months.  Will consider stopping the Strattera  and increasing the Wellbutrin  if needed.  Encouraged to continue therapy with Devere.  Recommend to call back if she has any question or any concern.  Follow-up in 3 months   Follow Up Instructions:     I discussed the assessment and treatment plan with the patient. The patient was provided an opportunity to ask questions and all were answered. The patient agreed with the plan and demonstrated an understanding of the instructions.   The patient was advised to call back or seek an in-person evaluation if the symptoms worsen or if the condition fails to improve as anticipated.    Collaboration of Care: Other provider involved in patient's care AEB notes are available in epic to review  Patient/Guardian was advised Release of Information must be obtained prior to any record release in order to collaborate their care with an outside provider. Patient/Guardian was advised if they have not already done so to contact the registration department to sign all necessary forms in order for us  to release information regarding their care.   Consent: Patient/Guardian gives verbal consent for treatment and assignment of benefits for services provided during this visit. Patient/Guardian expressed understanding and agreed to proceed.     Total encounter time 25 minutes which includes face-to-face time, chart reviewed, care coordination, order entry and documentation during this encounter.   Note: This document was prepared by [employer] voice dictation technology and any errors that results from this process are unintentional.    Leni ONEIDA Client, MD 09/09/2024

## 2024-09-11 ENCOUNTER — Encounter (HOSPITAL_COMMUNITY): Payer: Self-pay

## 2024-09-11 NOTE — Telephone Encounter (Signed)
 If Wellbutrin  not helping her attention and focus then she can take extra Strattera  40 mg in the afternoon.

## 2024-09-17 ENCOUNTER — Ambulatory Visit: Admitting: Family Medicine

## 2024-10-05 ENCOUNTER — Other Ambulatory Visit (HOSPITAL_COMMUNITY): Payer: Self-pay | Admitting: Psychiatry

## 2024-10-05 DIAGNOSIS — F411 Generalized anxiety disorder: Secondary | ICD-10-CM

## 2024-10-05 DIAGNOSIS — F3341 Major depressive disorder, recurrent, in partial remission: Secondary | ICD-10-CM

## 2024-10-15 ENCOUNTER — Other Ambulatory Visit (HOSPITAL_COMMUNITY): Payer: Self-pay | Admitting: Psychiatry

## 2024-10-15 DIAGNOSIS — F3341 Major depressive disorder, recurrent, in partial remission: Secondary | ICD-10-CM

## 2024-10-15 DIAGNOSIS — F411 Generalized anxiety disorder: Secondary | ICD-10-CM

## 2024-10-15 DIAGNOSIS — F902 Attention-deficit hyperactivity disorder, combined type: Secondary | ICD-10-CM

## 2024-10-21 ENCOUNTER — Ambulatory Visit
Admission: EM | Admit: 2024-10-21 | Discharge: 2024-10-21 | Disposition: A | Discharge status: Home / Self Care | Attending: Family Medicine | Admitting: Family Medicine

## 2024-10-21 DIAGNOSIS — U071 COVID-19: Secondary | ICD-10-CM | POA: Diagnosis not present

## 2024-10-21 DIAGNOSIS — R059 Cough, unspecified: Secondary | ICD-10-CM

## 2024-10-21 LAB — POC SOFIA SARS ANTIGEN FIA: SARS Coronavirus 2 Ag: POSITIVE — AB

## 2024-10-21 MED ORDER — PAXLOVID (300/100) 20 X 150 MG & 10 X 100MG PO TBPK: 3.0000 | ORAL_TABLET | Freq: Two times a day (BID) | ORAL | 0 refills | Status: AC

## 2024-10-21 MED ORDER — PREDNISONE 20 MG PO TABS: ORAL_TABLET | ORAL | 0 refills | Status: DC

## 2024-10-21 MED ORDER — AZITHROMYCIN 250 MG PO TABS: 250.0000 mg | ORAL_TABLET | Freq: Every day | ORAL | 0 refills | Status: DC

## 2024-10-21 NOTE — ED Triage Notes (Addendum)
 Pt c/o cough and congestion x 4 days. Denies fever. Hx of seasonal allergies and asthma. Taking mucinex , levocetirizine and nasal spray prn.

## 2024-10-21 NOTE — ED Provider Notes (Signed)
 " [facility]gr[facility]    CSN: [id number] Arrival date & time: 10/21/24  0940      History   Chief Complaint Chief Complaint  Patient presents with   Nasal Congestion   Cough    HPI Candice Mcgrath is a 48 y.o. female.   HPI 48 year old female presents with nasal congestion and cough for 4-5 days.  PMH significant for asthma, depression, and anxiety  Past Medical History:  Diagnosis Date   Allergic rhinitis    Anxiety    Asthma    Depression    Herpes    History of alcohol abuse    [facility]ially [facility]   History of drug abuse in remission (HCC)    adderrall, Xanax, MJ, others, no IV   Hyperlipidemia    IBS (irritable bowel syndrome)    Migraine headache 12/24/2012   Nephrolithiasis    Substance abuse (HCC)    ETOH, Aderall pot    Patient Active Problem List   Diagnosis Date Noted   Mild persistent asthma, uncomplicated 12/06/2020   Diabetes mellitus type 2, diet-controlled (HCC) 12/06/2020   Generalized anxiety disorder 09/02/2014   Major depressive disorder, recurrent episode, in partial remission (HCC) 05/18/2014   Personal history of drug dependence (HCC) 12/24/2012   Migraine headache 12/24/2012   IBS 12/13/2009   Hyperlipidemia 11/08/2009   Attention deficit disorder 11/08/2009   Allergic rhinitis 11/01/2008   NEPHROLITHIASIS, HX OF 11/01/2008   Genital herpes 10/30/2008   Personal history of alcoholism (HCC) 10/30/2008    Past Surgical History:  Procedure Laterality Date   Basket Surgery  2005, 06   kidney stones   TYMPANOSTOMY TUBE PLACEMENT Bilateral    as a child    OB History     Gravida  0   Para  0   Term  0   Preterm  0   AB  0   Living  0      SAB  0   IAB  0   Ectopic  0   Multiple  0   Live Births  0            Home Medications    Prior to Admission medications  Medication Sig Start Date End Date Taking? Authorizing Provider  azithromycin  (ZITHROMAX ) 250 MG tablet Take 1  tablet (250 mg total) by mouth daily. Take first 2 tablets together, then 1 every day until finished. 10/21/24  Yes Cleto Claggett, FNP  nirmatrelvir /ritonavir  (PAXLOVID , 300/100,) 20 x 150 MG & 10 x 100MG  TBPK Take 3 tablets by mouth 2 (two) times daily for 5 days. Patient GFR is 82.56. Take nirmatrelvir  (150 mg) two tablets twice daily for 5 days and ritonavir  (100 mg) one tablet twice daily for 5 days. 10/21/24 10/26/24 Yes Teddy Sharper, FNP  predniSONE  (DELTASONE ) 20 MG tablet Take 3 tabs PO daily x 5 days. 10/21/24  Yes Ipek Westra, FNP  albuterol  (VENTOLIN  HFA) 108 (90 Base) MCG/ACT inhaler Inhale 1-2 puffs into the lungs every 6 (six) hours as needed for wheezing or shortness of breath. 06/24/23   Maranda Jamee Jacob, MD  Ascorbic Acid (VITAMIN C) 100 MG tablet Take 100 mg by mouth daily.    [provider]  atomoxetine  (STRATTERA ) 40 MG capsule Take 1 capsule (40 mg total) by mouth daily. 09/12/23   Arfeen, Leni DASEN, MD  atomoxetine  (STRATTERA ) 60 MG capsule Take 1 capsule (60 mg total) by mouth daily. 09/09/24   Curry Leni DASEN, MD  Azelastine HCl 137 MCG/SPRAY SOLN Place into both nostrils. 08/27/23   [provider]  betamethasone  valerate ointment (VALISONE ) 0.1 % APPLY TO AFFECTED AREA TWICE A DAY 11/26/23   Copland, Jacques, MD  buPROPion  (WELLBUTRIN  XL) 150 MG 24 hr tablet Take 1 tablet (150 mg total) by mouth daily. 09/09/24   Arfeen, Leni DASEN, MD  busPIRone  (BUSPAR ) 5 MG tablet Take 1 tablet (5 mg total) by mouth 3 (three) times daily as needed. 09/09/24   Arfeen, Leni DASEN, MD  chlorproMAZINE (THORAZINE) 25 MG tablet Take 25 mg by mouth daily as needed. 10/07/20   [provider]  EMGALITY 120 MG/ML SOAJ every 30 (thirty) days. 03/03/24   Oneita Na, MD  EPINEPHrine 0.3 mg/0.3 mL IJ SOAJ injection USE UTD PRF SYSTEMATIC REACTION. 04/06/17   [provider]  escitalopram  (LEXAPRO ) 20 MG tablet Take 1 tablet (20 mg total) by mouth daily. 09/09/24   Arfeen,  Leni DASEN, MD  fluticasone  (FLONASE ) 50 MCG/ACT nasal spray Place into both nostrils. 08/27/23   [provider]  hydrOXYzine (ATARAX) 25 MG tablet Take 25 mg by mouth daily as needed for itching. 07/05/23   [provider]  hyoscyamine  (LEVSIN  SL) 0.125 MG SL tablet Place 1 tablet (0.125 mg total) under the tongue every 4 (four) hours as needed. 10/28/23   Copland, Jacques, MD  ketorolac  (TORADOL ) 10 MG tablet Take 1 tablet (10 mg total) by mouth every 6 (six) hours as needed for moderate pain or severe pain. 09/14/21   Copland, Jacques, MD  levocetirizine (XYZAL ) 5 MG tablet Take 1 tablet (5 mg total) by mouth every evening. 06/04/23   Copland, Jacques, MD  lidocaine -prilocaine  (EMLA ) cream APPLY TOPICALLY 4 (FOUR) TIMES DAILY AS NEEDED. FOUR TIMES A DAY TO AFFECTED AREA, 1 MONTH SUPPLY 08/10/20   Copland, Jacques, MD  Multiple Vitamins-Minerals (MULTI-VITAMIN GUMMIES PO) Take by mouth.    [provider]  PAZEO 0.7 % SOLN  04/06/17   [provider]  Semaglutide , 2 MG/DOSE, (OZEMPIC , 2 MG/DOSE,) 8 MG/3ML SOPN Inject 2 mg into the skin once a week. 11/26/23   Copland, Jacques, MD  SUMAtriptan (IMITREX) 100 MG tablet Take by mouth. 11/25/20   [provider]  tiZANidine  (ZANAFLEX ) 4 MG tablet TAKE 1 TABLET BY MOUTH EVERYDAY AT BEDTIME 06/11/24   Copland, Jacques, MD  triamcinolone  cream (KENALOG ) 0.1 % Apply 1 application topically 2 (two) times daily. 05/07/21   Elvie French HERO, PA-C  valACYclovir  (VALTREX ) 500 MG tablet TAKE 1 TABLET (500 MG TOTAL) BY MOUTH 2 (TWO) TIMES DAILY. FOR 3 DAYS DURING A FLARE 11/19/23   Copland, Jacques, MD    Family History Family History  Problem Relation Age of Onset   Colon polyps Mother    Cancer Maternal Uncle    Stroke Maternal Grandmother    Cancer Maternal Grandfather    Heart disease Paternal Grandfather    Breast cancer Cousin    Alcohol abuse Other        GP   Stroke Other        GGM   Colon cancer Neg Hx     Esophageal cancer Neg Hx    Rectal cancer Neg Hx    Stomach cancer Neg Hx     Social History Social History[1]   Allergies   Sulfamethoxazole-trimethoprim and Sulfa antibiotics   Review of Systems Review of Systems  HENT:  Positive for congestion.   Respiratory:  Positive for cough.   All other systems reviewed and  are negative.    Physical Exam Triage Vital Signs ED Triage Vitals  Encounter Vitals Group     BP 10/21/24 1050 100/71     Girls Systolic BP Percentile --      Girls Diastolic BP Percentile --      Boys Systolic BP Percentile --      Boys Diastolic BP Percentile --      Pulse Rate 10/21/24 1050 (!) 114     Resp 10/21/24 1050 17     Temp 10/21/24 1050 98 F (36.7 C)     Temp Source 10/21/24 1050 Oral     SpO2 10/21/24 1050 98 %     Weight --      Height --      Head Circumference --      Peak Flow --      Pain Score 10/21/24 1051 0     Pain Loc --      Pain Education --      Exclude from Growth Chart --    No data found.  Updated Vital Signs BP 100/71 (BP Location: Right Arm)   Pulse (!) 114   Temp 98 F (36.7 C) (Oral)   Resp 17   SpO2 98%   Visual Acuity Right Eye Distance:   Left Eye Distance:   Bilateral Distance:    Right Eye Near:   Left Eye Near:    Bilateral Near:     Physical Exam Vitals and nursing note reviewed.  Constitutional:      Appearance: Normal appearance. She is normal weight. She is ill-appearing.  HENT:     Head: Normocephalic and atraumatic.     Right Ear: Tympanic membrane, ear canal and external ear normal.     Left Ear: Tympanic membrane, ear canal and external ear normal.     Mouth/Throat:     Mouth: Mucous membranes are moist.     Pharynx: Oropharynx is clear.  Eyes:     Extraocular Movements: Extraocular movements intact.     Conjunctiva/sclera: Conjunctivae normal.     Pupils: Pupils are equal, round, and reactive to light.  Cardiovascular:     Rate and Rhythm: Normal rate and regular rhythm.      Heart sounds: Normal heart sounds.  Pulmonary:     Effort: Pulmonary effort is normal.     Breath sounds: Normal breath sounds. No wheezing, rhonchi or rales.     Comments: Infrequent nonproductive cough on exam Musculoskeletal:        General: Normal range of motion.  Skin:    General: Skin is warm and dry.  Neurological:     General: No focal deficit present.     Mental Status: She is alert and oriented to person, place, and time. Mental status is at baseline.  Psychiatric:        Mood and Affect: Mood normal.        Behavior: Behavior normal.      UC Treatments / Results  Labs (all labs ordered are listed, but only abnormal results are displayed) Labs Reviewed  POC SOFIA SARS ANTIGEN FIA - Abnormal; Notable for the following components:      Result Value   SARS Coronavirus 2 Ag Positive (*)    All other components within normal limits    EKG   Radiology No results found.  Procedures Procedures (including critical care time)  Medications Ordered in UC Medications - No data to display  Initial Impression / Assessment and Plan /  UC Course  I have reviewed the triage vital signs and the nursing notes.  Pertinent labs & imaging results that were available during my care of the patient were reviewed by me and considered in my medical decision making (see chart for details).     MDM: 1.  COVID-19-Rx'd Paxlovid : Take as directed; 2.,  Cough, unspecified type-Rx'd prednisone  20 mg tablet: Take 3 tablets p.o. daily x 5 days. Advised patient take medications as directed with food to completion.  Advised patient to take/complete Paxlovid  and prednisone  prior to Zithromax .  Advised take prednisone  with first dose of Paxlovid  for the next 5 days.  Encouraged increase daily water intake to 64 ounces per day while taking these medications.  Advised symptoms worsen and/or unresolved please follow-up with your PCP or here for further evaluation.  Patient discharged home,  hemodynamically stable. Final Clinical Impressions(s) / UC Diagnoses   Final diagnoses:  Cough, unspecified type  COVID-19     Discharge Instructions      Advised patient take medications as directed with food to completion.  Advised patient to take/complete Paxlovid  and prednisone  prior to Zithromax .  Advised take prednisone  with first dose of Paxlovid  for the next 5 days.  Encouraged increase daily water intake to 64 ounces per day while taking these medications.  Advised symptoms worsen and/or unresolved please follow-up with your PCP or here for further evaluation.     ED Prescriptions     Medication Sig Dispense Auth. Provider   azithromycin  (ZITHROMAX ) 250 MG tablet Take 1 tablet (250 mg total) by mouth daily. Take first 2 tablets together, then 1 every day until finished. 6 tablet Kodie Kishi, FNP   predniSONE  (DELTASONE ) 20 MG tablet Take 3 tabs PO daily x 5 days. 15 tablet Alecea Trego, FNP   nirmatrelvir /ritonavir  (PAXLOVID , 300/100,) 20 x 150 MG & 10 x 100MG  TBPK Take 3 tablets by mouth 2 (two) times daily for 5 days. Patient GFR is 82.56. Take nirmatrelvir  (150 mg) two tablets twice daily for 5 days and ritonavir  (100 mg) one tablet twice daily for 5 days. 30 tablet Crystalmarie Yasin, FNP      PDMP not reviewed this encounter.    [1]  Social History Tobacco Use   Smoking status: Every Day    Current packs/day: 0.00    Average packs/day: 0.5 packs/day    Types: Cigarettes    Last attempt to quit: 06/24/2016    Years since quitting: 8.3   Smokeless tobacco: Current    Last attempt to quit: 12/04/2008  Vaping Use   Vaping status: Every Day  Substance Use Topics   Alcohol use: Not Currently    Alcohol/week: 2.0 - 4.0 standard drinks of alcohol    Types: 2 - 4 Cans of beer per week    Comment: recovering AA   Drug use: No    Comment: Recovering NA     Teddy Sharper, FNP 10/21/24 1134  "

## 2024-10-21 NOTE — Discharge Instructions (Addendum)
 Advised patient take medications as directed with food to completion.  Advised patient to take/complete Paxlovid  and prednisone  prior to Zithromax .  Advised take prednisone  with first dose of Paxlovid  for the next 5 days.  Encouraged increase daily water intake to 64 ounces per day while taking these medications.  Advised symptoms worsen and/or unresolved please follow-up with your PCP or here for further evaluation.

## 2024-10-29 ENCOUNTER — Other Ambulatory Visit: Payer: Self-pay | Admitting: Family Medicine

## 2024-10-29 NOTE — Telephone Encounter (Signed)
Please call and schedule CPE with fasting labs prior with Dr. Copland.  

## 2024-10-30 ENCOUNTER — Encounter: Payer: Self-pay | Admitting: Family Medicine

## 2024-10-31 MED ORDER — HYDROCORTISONE ACETATE 25 MG RE SUPP: 25.0000 mg | Freq: Two times a day (BID) | RECTAL | 3 refills | Status: AC

## 2024-11-06 ENCOUNTER — Other Ambulatory Visit: Payer: Self-pay

## 2024-11-12 ENCOUNTER — Telehealth (HOSPITAL_COMMUNITY): Admitting: Psychiatry

## 2024-11-12 ENCOUNTER — Encounter (HOSPITAL_COMMUNITY): Payer: Self-pay | Admitting: Psychiatry

## 2024-11-12 VITALS — Wt 159.0 lb

## 2024-11-12 DIAGNOSIS — F3341 Major depressive disorder, recurrent, in partial remission: Secondary | ICD-10-CM

## 2024-11-12 DIAGNOSIS — F902 Attention-deficit hyperactivity disorder, combined type: Secondary | ICD-10-CM

## 2024-11-12 DIAGNOSIS — F411 Generalized anxiety disorder: Secondary | ICD-10-CM | POA: Diagnosis not present

## 2024-11-12 MED ORDER — ATOMOXETINE HCL 60 MG PO CAPS: 60.0000 mg | ORAL_CAPSULE | Freq: Every day | ORAL | 0 refills | Status: AC

## 2024-11-12 MED ORDER — BUSPIRONE HCL 5 MG PO TABS: 5.0000 mg | ORAL_TABLET | Freq: Two times a day (BID) | ORAL | 0 refills | Status: AC

## 2024-11-12 MED ORDER — BUPROPION HCL ER (XL) 300 MG PO TB24: 300.0000 mg | ORAL_TABLET | Freq: Every day | ORAL | 1 refills | Status: AC

## 2024-11-12 MED ORDER — ESCITALOPRAM OXALATE 10 MG PO TABS: 10.0000 mg | ORAL_TABLET | Freq: Every day | ORAL | 1 refills | Status: AC

## 2024-11-12 NOTE — Progress Notes (Signed)
 " [facility] MD Virtual Progress Note   Patient Location: Work Provider Location: Home Office  I connect with patient by video and verified that I am speaking with correct person by using two identifiers. I discussed the limitations of evaluation and management by telemedicine and the availability of in person appointments. I also discussed with the patient that there may be a patient responsible charge related to this service. The patient expressed understanding and agreed to proceed.  Candice Mcgrath [place] [phone] 49 y.o.  11/12/2024 9:03 AM  History of Present Illness:  Patient is evaluated by video session.  She liked Wellbutrin  but is still taking full dose of Lexapro .  She cut down the BuSpar  and only taking twice a day.  She reported Christmas was okay.  Her husband was in [place]  taking care of his parents..  Patient told she was busy but spent Christmas with the family.  She reported mother having complication after the knee surgery and may require another surgery.  She also reported father may also required the knee surgery.  Patient also reported that she was very pleased that her brother will have a different political views actually supported the family and the patient and did not have any argument.  She reported lately noticed hot flashes and excessive sweating.  She sometimes wake up with the drenched of sweats.  She had seen OB/GYN few months ago but I did not see any hormones level.  She had called to adjust her dose of the medication to help with ADHD.  I have recommended that she can take the 40 mg Strattera  with 60 mg but patient has not has to take it.  She reported attention focus is not as bad.  She is able to finish her multitasking.  She had a good support from her coworker.  She denies any major panic attack, crying spells or any feeling of hopelessness.  She like to talk about decreased libido and wondering if she can try a different antidepressant.  She is  on Lexapro  20 mg.  She actually like the Wellbutrin  because it helps her energy, focus.  She is also prescribed Thorazine and hydroxyzine but lately has not had a need to take it.  She liked the [facility] for the headaches and very rarely she need to take the Thorazine.  She takes hydroxyzine when she has itching.  She has no tremors, shakes or any EPS.  She denies any paranoia, hallucination.  She is in therapy with Devere at [facility].  She remains sober from drinking.  Recently she had a flu but now she is fully recovered and doing better.  Past Psychiatric History: History of ADHD, anxiety, depression, drug and ETOH use. 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 Meds were given by PCP. H/O rehab in 2007.  As per chart tried Prozac  and Wellbutrin  but no details. No history of suicidal attempt, psychosis, mania, hallucination, PTSD, panic attacks.     Past Medical History:  Diagnosis Date   Allergic rhinitis    Anxiety    Asthma    Depression    Herpes    History of alcohol abuse    initially [facility]   History of drug abuse in remission (HCC)    adderrall, Xanax, MJ, others, no IV   Hyperlipidemia    IBS (irritable bowel syndrome)    Migraine headache 12/24/2012   Nephrolithiasis    Substance abuse (HCC)    ETOH, Aderall pot  Outpatient Encounter Medications as of 11/12/2024  Medication Sig   albuterol  (VENTOLIN  HFA) 108 (90 Base) MCG/ACT inhaler Inhale 1-2 puffs into the lungs every 6 (six) hours as needed for wheezing or shortness of breath.   Ascorbic Acid (VITAMIN C) 100 MG tablet Take 100 mg by mouth daily.   atomoxetine  (STRATTERA ) 40 MG capsule Take 1 capsule (40 mg total) by mouth daily.   atomoxetine  (STRATTERA ) 60 MG capsule Take 1 capsule (60 mg total) by mouth daily.   Azelastine HCl 137 MCG/SPRAY SOLN Place into both nostrils.   azithromycin  (ZITHROMAX ) 250 MG tablet Take 1 tablet (250 mg total) by mouth daily. Take first 2 tablets together, then 1  every day until finished.   betamethasone  valerate ointment (VALISONE ) 0.1 % APPLY TO AFFECTED AREA TWICE A DAY   buPROPion  (WELLBUTRIN  XL) 150 MG 24 hr tablet Take 1 tablet (150 mg total) by mouth daily.   busPIRone  (BUSPAR ) 5 MG tablet Take 1 tablet (5 mg total) by mouth 3 (three) times daily as needed.   chlorproMAZINE (THORAZINE) 25 MG tablet Take 25 mg by mouth daily as needed.   EMGALITY 120 MG/ML SOAJ every 30 (thirty) days.   EPINEPHrine 0.3 mg/0.3 mL IJ SOAJ injection USE UTD PRF SYSTEMATIC REACTION.   escitalopram  (LEXAPRO ) 20 MG tablet Take 1 tablet (20 mg total) by mouth daily.   fluticasone  (FLONASE ) 50 MCG/ACT nasal spray Place into both nostrils.   hydrocortisone  (ANUSOL -HC) 25 MG suppository Place 1 suppository (25 mg total) rectally 2 (two) times daily.   hydrOXYzine (ATARAX) 25 MG tablet Take 25 mg by mouth daily as needed for itching.   hyoscyamine  (LEVSIN  SL) 0.125 MG SL tablet Place 1 tablet (0.125 mg total) under the tongue every 4 (four) hours as needed.   ketorolac  (TORADOL ) 10 MG tablet Take 1 tablet (10 mg total) by mouth every 6 (six) hours as needed for moderate pain or severe pain.   levocetirizine (XYZAL ) 5 MG tablet Take 1 tablet (5 mg total) by mouth every evening.   lidocaine -prilocaine  (EMLA ) cream APPLY TOPICALLY 4 (FOUR) TIMES DAILY AS NEEDED. FOUR TIMES A DAY TO AFFECTED AREA, 1 MONTH SUPPLY   Multiple Vitamins-Minerals (MULTI-VITAMIN GUMMIES PO) Take by mouth.   PAZEO 0.7 % SOLN    predniSONE  (DELTASONE ) 20 MG tablet Take 3 tabs PO daily x 5 days.   Semaglutide , 2 MG/DOSE, (OZEMPIC , 2 MG/DOSE,) 8 MG/3ML SOPN INJECT 2 MG INTO THE SKIN ONCE A WEEK.   SUMAtriptan (IMITREX) 100 MG tablet Take by mouth.   tiZANidine  (ZANAFLEX ) 4 MG tablet TAKE 1 TABLET BY MOUTH EVERYDAY AT BEDTIME   triamcinolone  cream (KENALOG ) 0.1 % Apply 1 application topically 2 (two) times daily.   valACYclovir  (VALTREX ) 500 MG tablet TAKE 1 TABLET (500 MG TOTAL) BY MOUTH 2 (TWO) TIMES  DAILY. FOR 3 DAYS DURING A FLARE   No facility-administered encounter medications on file as of 11/12/2024.    Recent Results (from the past 2160 hours)  POC Covid Ag     Status: Abnormal   Collection Time: 10/21/24 11:17 AM  Result Value Ref Range   SARS Coronavirus 2 Ag Positive (A) Negative     Psychiatric Specialty Exam: Physical Exam  Review of Systems  Constitutional:        Hot flashes and sweating    Weight 159 lb (72.1 kg).There is no height or weight on file to calculate BMI.  General Appearance: Casual  Eye Contact:  Good  Speech:  Clear and Coherent  and Normal Rate  Volume:  Normal  Mood:  Euthymic  Affect:  Appropriate  Thought Process:  Goal Directed  Orientation:  Full (Time, Place, and Person)  Thought Content:  WDL  Suicidal Thoughts:  No  Homicidal Thoughts:  No  Memory:  Immediate;   Good Recent;   Good Remote;   Good  Judgement:  Intact  Insight:  Present  Psychomotor Activity:  Normal  Concentration:  Concentration: Good and Attention Span: Good  Recall:  Good  Fund of Knowledge:  Good  Language:  Good  Akathisia:  No  Handed:  Right  AIMS (if indicated):     Assets:  Communication Skills Desire for Improvement Housing Resilience Social Support Talents/Skills Transportation  ADL's:  Intact  Cognition:  WNL  Sleep:  ok       07/23/2024    1:59 PM 02/07/2024    2:09 PM 01/28/2024    9:31 AM 07/12/2023    3:59 PM 04/02/2023   11:34 AM  Depression screen PHQ 2/9  Decreased Interest 1 0 1 1 1   Down, Depressed, Hopeless 1 0 1 0 1  PHQ - 2 Score 2 0 2 1 2   Altered sleeping 1  1 0 1  Tired, decreased energy 1  1 1 1   Change in appetite 1  1 0 1  Feeling bad or failure about yourself  0  0 0 0  Trouble concentrating 1  1 0 1  Moving slowly or fidgety/restless 0  0 0 0  Suicidal thoughts 0  0 0 0  PHQ-9 Score 6   6  2  6    Difficult doing work/chores Not difficult at all  Somewhat difficult Somewhat difficult Somewhat difficult     Data  saved with a previous flowsheet row definition    Assessment/Plan: Major depressive disorder, recurrent episode, in partial remission - Plan: busPIRone  (BUSPAR ) 5 MG tablet, buPROPion  (WELLBUTRIN  XL) 300 MG 24 hr tablet, escitalopram  (LEXAPRO ) 10 MG tablet  Attention deficit hyperactivity disorder (ADHD), combined type - Plan: atomoxetine  (STRATTERA ) 60 MG capsule, buPROPion  (WELLBUTRIN  XL) 300 MG 24 hr tablet  Generalized anxiety disorder - Plan: busPIRone  (BUSPAR ) 5 MG tablet, buPROPion  (WELLBUTRIN  XL) 300 MG 24 hr tablet, escitalopram  (LEXAPRO ) 10 MG tablet  Patient is 49 year old employed married female with major depressive disorder, ADHD, combined type, generalized anxiety disorder.  She also have migraine and diabetes.  I review current medication and current information.  She is concerned about decreased libido.  She is wondering if she can try a different medicine.  She is on Lexapro .  I recommend to reduce the dose Lexapro  from 20 mg to 10 mg and increase Wellbutrin  from 150-300.  It will help her and also may have more benefit for focus and attention.  She agreed to give a try.  She had cut down her BuSpar  from 5 mg 3 times a day to twice daily.  I recommend she can try even going down further and take only 1 a day.  Recommend not to take extra Strattera  40 mg since 60 mg is doing okay and better.  So far no major concern or side effects.  Encouraged to continue therapy with Devere.  I also recommend to contact OB/GYN to get a hormone level as patient having hot flashes and night sweats.  Will follow-up in 6 weeks but patient can call us  to get a sooner appointment if needed.   Follow Up Instructions:     I discussed the assessment  and treatment plan with the patient. The patient was provided an opportunity to ask questions and all were answered. The patient agreed with the plan and demonstrated an understanding of the instructions.   The patient was advised to call back or seek an  in-person evaluation if the symptoms worsen or if the condition fails to improve as anticipated.    Collaboration of Care: Other provider involved in patient's care AEB notes are available in epic to review  Patient/Guardian was advised Release of Information must be obtained prior to any record release in order to collaborate their care with an outside provider. Patient/Guardian was advised if they have not already done so to contact the registration department to sign all necessary forms in order for us  to release information regarding their care.   Consent: Patient/Guardian gives verbal consent for treatment and assignment of benefits for services provided during this visit. Patient/Guardian expressed understanding and agreed to proceed.     Total encounter time 31 minutes which includes face-to-face time, chart reviewed, care coordination, order entry and documentation during this encounter.   Note: This document was prepared by [employer] voice dictation technology and any errors that results from this process are unintentional.    Leni ONEIDA Client, MD 11/12/2024   "

## 2024-11-25 ENCOUNTER — Ambulatory Visit
Admission: EM | Admit: 2024-11-25 | Discharge: 2024-11-25 | Disposition: A | Discharge status: Home / Self Care | Source: Home / Self Care

## 2024-11-25 ENCOUNTER — Ambulatory Visit: Payer: Self-pay

## 2024-11-25 DIAGNOSIS — R1031 Right lower quadrant pain: Secondary | ICD-10-CM | POA: Diagnosis not present

## 2024-11-25 LAB — POCT URINE DIPSTICK
Bilirubin, UA: NEGATIVE
Blood, UA: NEGATIVE
Glucose, UA: NEGATIVE mg/dL
Ketones, POC UA: NEGATIVE mg/dL
Leukocytes, UA: NEGATIVE
Nitrite, UA: NEGATIVE
POC PROTEIN,UA: NEGATIVE
Spec Grav, UA: 1.02
Urobilinogen, UA: 0.2 U/dL
pH, UA: 7

## 2024-11-25 LAB — POCT URINE PREGNANCY: Preg Test, Ur: NEGATIVE

## 2024-11-25 NOTE — Telephone Encounter (Signed)
 Next Appt With Family Medicine Michail Schroeder, MD) 11/26/2024 at 8:40 AM

## 2024-11-25 NOTE — ED Triage Notes (Signed)
 Pt present with c/o rt side groin pain. Pt states she has had kidney stones before and feels she might have one again. Pt denies blood in urine and dysuria. Denies odor in urine. Pt states she is urinating frequently.

## 2024-11-26 ENCOUNTER — Encounter: Payer: Self-pay | Admitting: Family Medicine

## 2024-11-26 ENCOUNTER — Ambulatory Visit: Admitting: Family Medicine

## 2024-11-26 VITALS — BP 104/80 | HR 103 | Temp 98.2°F | Ht 60.75 in | Wt 162.4 lb

## 2024-11-26 DIAGNOSIS — N951 Menopausal and female climacteric states: Secondary | ICD-10-CM

## 2024-11-26 DIAGNOSIS — R5383 Other fatigue: Secondary | ICD-10-CM | POA: Diagnosis not present

## 2024-11-26 DIAGNOSIS — Z79899 Other long term (current) drug therapy: Secondary | ICD-10-CM

## 2024-11-26 DIAGNOSIS — R1031 Right lower quadrant pain: Secondary | ICD-10-CM

## 2024-11-26 DIAGNOSIS — E119 Type 2 diabetes mellitus without complications: Secondary | ICD-10-CM

## 2024-11-26 DIAGNOSIS — G8929 Other chronic pain: Secondary | ICD-10-CM

## 2024-11-26 DIAGNOSIS — R102 Pelvic and perineal pain unspecified side: Secondary | ICD-10-CM | POA: Diagnosis not present

## 2024-11-26 DIAGNOSIS — N912 Amenorrhea, unspecified: Secondary | ICD-10-CM

## 2024-11-26 NOTE — Progress Notes (Signed)
 "    Candice Silveri T. Cote Mayabb, MD, CAQ Sports Medicine [facility] [address] [place], [address]  Phone: [phone]  FAX: [phone]  Candice Mcgrath - 49 y.o. female  MRN [id number]  Date of Birth: [birth year]  Date: 11/26/2024  PCP: Watt Mirza, MD  Referral: Watt Mirza, MD  Chief Complaint  Patient presents with   RLQ Pain    Urgent Care yesterday   Subjective:   Candice Mcgrath is a 49 y.o. very pleasant female patient with Body mass index is 30.93 kg/m. who presents with the following:  Discussed the use of AI scribe software for clinical note transcription with the patient, who gave verbal consent to proceed.   History of Present Illness Candice Mcgrath is a 49 year old female who presents with persistent lower abdominal pain.  She has been experiencing intermittent lower abdominal pain for the past couple of months. Initially, the pain was sharp, but there was no hematuria, and it subsided quickly. The pain recurred the following month, and she noted it might be related to ovulation, but she has not had her period in 45 days. Recently, the pain persisted for two days as a throbbing sensation, differing from previous episodes. She attempted to relieve the pain with an enema, suspecting constipation, but the pain persisted. No fever, chills, or respiratory symptoms are present.  She has a history of a kidney stone procedure many years ago. Her menstrual cycles have been irregular, with the last period occurring about 40 days ago. She has not had any recent surgeries and maintains normal eating and drinking habits with regular bowel movements and urination.  She is currently on Wellbutrin , which was recently increased, and Lexapro , which was decreased.  She is currently teaching school.  Wt Readings from Last 3 Encounters:  11/26/24 162 lb 6 oz (73.7 kg)  07/23/24 165 lb (74.8 kg)  02/07/24 166 lb (75.3  kg)    Results for orders placed or performed during the hospital encounter of 11/25/24  POCT URINE DIPSTICK   Collection Time: 11/25/24 12:27 PM  Result Value Ref Range   Color, UA yellow yellow   Clarity, UA clear clear   Glucose, UA negative negative mg/dL   Bilirubin, UA negative negative   Ketones, POC UA negative negative mg/dL   Spec Grav, UA 8.979 8.989 - 1.025   Blood, UA negative negative   pH, UA 7.0 5.0 - 8.0   POC PROTEIN,UA negative negative, trace   Urobilinogen, UA 0.2 0.2 or 1.0 E.U./dL   Nitrite, UA Negative Negative   Leukocytes, UA Negative Negative  POCT urine pregnancy   Collection Time: 11/25/24 12:27 PM  Result Value Ref Range   Preg Test, Ur Negative Negative     Review of Systems is noted in the HPI, as appropriate  Objective:   BP 104/80   Pulse (!) 103   Temp 98.2 F (36.8 C) (Temporal)   Ht 5' 0.75 (1.543 m)   Wt 162 lb 6 oz (73.7 kg)   SpO2 99%   BMI 30.93 kg/m   GEN: No acute distress; alert,appropriate. CV: RRR, no m/g/r  PULM: Normal respiratory rate, no accessory muscle use. No wheezes, crackles or rhonchi  PSYCH: Normally interactive.   Full range of motion at the hip on the right with no significant pain with resisted flexion, abduction, adduction or rotational maneuvers of the hip  ABD: S, NT, ND, + BS, No rebound, No HSM  This portion of the physical examination was chaperoned by Arland Morel, CMA.  Lower pelvic pain on the right lateral and just caudal to the pubic symphysis  Laboratory and Imaging Data:  Assessment and Plan:     ICD-10-CM   1. Chronic pelvic pain in female  R10.20    G89.29     2. Abdominal pain, chronic, right lower quadrant  R10.31    G89.29     3. Diabetes mellitus type 2, diet-controlled (HCC)  E11.9 Hemoglobin A1c    Microalbumin / creatinine urine ratio    4. Perimenopausal  N95.1 TSH    T4, free    T3, free    FSH/LH    Estradiol    Prolactin    Testos,Total,Free and SHBG (Female)     5. Other fatigue  R53.83 TSH    T4, free    T3, free    FSH/LH    Estradiol    Prolactin    Testos,Total,Free and SHBG (Female)    6. Encounter for long-term (current) use of medications  Z79.899 Basic metabolic panel with GFR    CBC with Differential/Platelet    Hepatic function panel    TSH    7. Amenorrhea  N91.2 FSH/LH    Estradiol    Prolactin    Testos,Total,Free and SHBG (Female)     Assessment & Plan Chronic pelvic and right lower quadrant pain Intermittent pelvic and right lower quadrant pain likely gynecological in origin, possibly ovulation pain or perimenopausal symptoms. - She has no intra-abdominal tenderness - Discussed direct ultrasound versus GYN evaluation. - Referred to gynecology for further evaluation and potential in-office ultrasound.  Perimenopausal symptoms with menstrual irregularity Menstrual irregularity and possible perimenopausal symptoms likely due to hormonal changes. -Psychiatry would like for her to have a hormonal workup which could impact psychiatric state - Ordered blood work for [facility], LH, and testosterone, and above  Medication Management during today's office visit: No orders of the defined types were placed in this encounter.  Medications Discontinued During This Encounter  Medication Reason   atomoxetine  (STRATTERA ) 40 MG capsule Completed Course   azithromycin  (ZITHROMAX ) 250 MG tablet Completed Course   predniSONE  (DELTASONE ) 20 MG tablet Completed Course    Orders placed today for conditions managed today: Orders Placed This Encounter  Procedures   Basic metabolic panel with GFR   CBC with Differential/Platelet   Hepatic function panel   Hemoglobin A1c   TSH   T4, free   T3, free   Microalbumin / creatinine urine ratio   FSH/LH   Estradiol   Prolactin   Testos,Total,Free and SHBG (Female)    Disposition: No follow-ups on file.  [employer] speech-to-text software was used for transcription in this  dictation.  Possible transcriptional errors can occur using Animal nutritionist.   Signed,  Jacques DASEN. Jacqueline Spofford, MD   Outpatient Encounter Medications as of 11/26/2024  Medication Sig   albuterol  (VENTOLIN  HFA) 108 (90 Base) MCG/ACT inhaler Inhale 1-2 puffs into the lungs every 6 (six) hours as needed for wheezing or shortness of breath.   Ascorbic Acid (VITAMIN C) 100 MG tablet Take 100 mg by mouth daily.   atomoxetine  (STRATTERA ) 60 MG capsule Take 1 capsule (60 mg total) by mouth daily.   Azelastine HCl 137 MCG/SPRAY SOLN Place into both nostrils.   betamethasone  valerate ointment (VALISONE ) 0.1 % APPLY TO AFFECTED AREA TWICE A DAY   buPROPion  (WELLBUTRIN  XL) 300 MG 24 hr tablet Take 1 tablet (300 mg  total) by mouth daily.   busPIRone  (BUSPAR ) 5 MG tablet Take 1 tablet (5 mg total) by mouth 2 (two) times daily.   chlorproMAZINE (THORAZINE) 25 MG tablet Take 25 mg by mouth daily as needed.   EMGALITY 120 MG/ML SOAJ every 30 (thirty) days.   EPINEPHrine 0.3 mg/0.3 mL IJ SOAJ injection USE UTD PRF SYSTEMATIC REACTION.   escitalopram  (LEXAPRO ) 10 MG tablet Take 1 tablet (10 mg total) by mouth daily.   fluticasone  (FLONASE ) 50 MCG/ACT nasal spray Place into both nostrils.   hydrocortisone  (ANUSOL -HC) 25 MG suppository Place 1 suppository (25 mg total) rectally 2 (two) times daily.   hydrOXYzine (ATARAX) 25 MG tablet Take 25 mg by mouth daily as needed for itching.   hyoscyamine  (LEVSIN  SL) 0.125 MG SL tablet Place 1 tablet (0.125 mg total) under the tongue every 4 (four) hours as needed.   ketorolac  (TORADOL ) 10 MG tablet Take 1 tablet (10 mg total) by mouth every 6 (six) hours as needed for moderate pain or severe pain.   levocetirizine (XYZAL ) 5 MG tablet Take 1 tablet (5 mg total) by mouth every evening.   lidocaine -prilocaine  (EMLA ) cream APPLY TOPICALLY 4 (FOUR) TIMES DAILY AS NEEDED. FOUR TIMES A DAY TO AFFECTED AREA, 1 MONTH SUPPLY   Multiple Vitamins-Minerals (MULTI-VITAMIN GUMMIES PO) Take  by mouth.   PAZEO 0.7 % SOLN    Semaglutide , 2 MG/DOSE, (OZEMPIC , 2 MG/DOSE,) 8 MG/3ML SOPN INJECT 2 MG INTO THE SKIN ONCE A WEEK.   SUMAtriptan (IMITREX) 100 MG tablet Take by mouth.   tiZANidine  (ZANAFLEX ) 4 MG tablet TAKE 1 TABLET BY MOUTH EVERYDAY AT BEDTIME   triamcinolone  cream (KENALOG ) 0.1 % Apply 1 application topically 2 (two) times daily.   valACYclovir  (VALTREX ) 500 MG tablet TAKE 1 TABLET (500 MG TOTAL) BY MOUTH 2 (TWO) TIMES DAILY. FOR 3 DAYS DURING A FLARE   [DISCONTINUED] atomoxetine  (STRATTERA ) 40 MG capsule Take 1 capsule (40 mg total) by mouth daily.   [DISCONTINUED] azithromycin  (ZITHROMAX ) 250 MG tablet Take 1 tablet (250 mg total) by mouth daily. Take first 2 tablets together, then 1 every day until finished.   [DISCONTINUED] predniSONE  (DELTASONE ) 20 MG tablet Take 3 tabs PO daily x 5 days.   No facility-administered encounter medications on file as of 11/26/2024.   "

## 2024-11-27 LAB — CBC WITH DIFFERENTIAL/PLATELET
Basophils Absolute: 0.1 10*3/uL (ref 0.0–0.1)
Basophils Relative: 0.6 % (ref 0.0–3.0)
Eosinophils Absolute: 0.2 10*3/uL (ref 0.0–0.7)
Eosinophils Relative: 1.7 % (ref 0.0–5.0)
HCT: 42.2 % (ref 36.0–46.0)
Hemoglobin: 14.2 g/dL (ref 12.0–15.0)
Lymphocytes Relative: 22.6 % (ref 12.0–46.0)
Lymphs Abs: 2.3 10*3/uL (ref 0.7–4.0)
MCHC: 33.8 g/dL (ref 30.0–36.0)
MCV: 93.9 fl (ref 78.0–100.0)
Monocytes Absolute: 1.1 10*3/uL — ABNORMAL HIGH (ref 0.1–1.0)
Monocytes Relative: 10.5 % (ref 3.0–12.0)
Neutro Abs: 6.7 10*3/uL (ref 1.4–7.7)
Neutrophils Relative %: 64.6 % (ref 43.0–77.0)
Platelets: 311 10*3/uL (ref 150.0–400.0)
RBC: 4.5 Mil/uL (ref 3.87–5.11)
RDW: 13.9 % (ref 11.5–15.5)
WBC: 10.4 10*3/uL (ref 4.0–10.5)

## 2024-11-27 LAB — TSH: TSH: 2.03 u[IU]/mL (ref 0.35–5.50)

## 2024-11-27 LAB — BASIC METABOLIC PANEL WITH GFR
BUN: 6 mg/dL (ref 6–23)
CO2: 29 meq/L (ref 19–32)
Calcium: 9.1 mg/dL (ref 8.4–10.5)
Chloride: 104 meq/L (ref 96–112)
Creatinine, Ser: 0.81 mg/dL (ref 0.40–1.20)
GFR: 86.6 mL/min
Glucose, Bld: 72 mg/dL (ref 70–99)
Potassium: 4.2 meq/L (ref 3.5–5.1)
Sodium: 139 meq/L (ref 135–145)

## 2024-11-27 LAB — MICROALBUMIN / CREATININE URINE RATIO
Creatinine,U: 103.8 mg/dL
Microalb Creat Ratio: 7.9 mg/g (ref 0.0–30.0)
Microalb, Ur: 0.8 mg/dL (ref 0.7–1.9)

## 2024-11-27 LAB — T4, FREE: Free T4: 0.64 ng/dL (ref 0.60–1.60)

## 2024-11-27 LAB — FSH/LH
FSH: 8.3 m[IU]/mL
LH: 7.2 m[IU]/mL

## 2024-11-27 LAB — HEPATIC FUNCTION PANEL
ALT: 11 U/L (ref 3–35)
AST: 13 U/L (ref 5–37)
Albumin: 4.1 g/dL (ref 3.5–5.2)
Alkaline Phosphatase: 61 U/L (ref 39–117)
Bilirubin, Direct: 0.1 mg/dL (ref 0.1–0.3)
Total Bilirubin: 0.3 mg/dL (ref 0.2–1.2)
Total Protein: 6.8 g/dL (ref 6.0–8.3)

## 2024-11-27 LAB — ESTRADIOL: Estradiol: 116 pg/mL

## 2024-11-27 LAB — PROLACTIN: Prolactin: 15.6 ng/mL

## 2024-11-27 LAB — HEMOGLOBIN A1C: Hgb A1c MFr Bld: 5.6 % (ref 4.6–6.5)

## 2024-11-27 LAB — T3, FREE: T3, Free: 3 pg/mL (ref 2.3–4.2)

## 2024-11-28 ENCOUNTER — Ambulatory Visit: Payer: Self-pay | Admitting: Family Medicine

## 2024-12-09 ENCOUNTER — Ambulatory Visit: Payer: Self-pay | Admitting: Family Medicine

## 2024-12-24 ENCOUNTER — Telehealth (HOSPITAL_COMMUNITY): Admitting: Psychiatry
